# Patient Record
Sex: Male | Born: 1970 | Race: White | Hispanic: No | Marital: Married | Smoking: Never smoker
Health system: Southern US, Community
[De-identification: ages and names within clinical notes are randomized; demographics above are authoritative.]

## PROBLEM LIST (undated history)

## (undated) DIAGNOSIS — K449 Diaphragmatic hernia without obstruction or gangrene: Secondary | ICD-10-CM

## (undated) DIAGNOSIS — M542 Cervicalgia: Secondary | ICD-10-CM

## (undated) DIAGNOSIS — F419 Anxiety disorder, unspecified: Secondary | ICD-10-CM

## (undated) DIAGNOSIS — I10 Essential (primary) hypertension: Secondary | ICD-10-CM

## (undated) DIAGNOSIS — K209 Esophagitis, unspecified without bleeding: Secondary | ICD-10-CM

## (undated) DIAGNOSIS — M1990 Unspecified osteoarthritis, unspecified site: Secondary | ICD-10-CM

## (undated) DIAGNOSIS — R091 Pleurisy: Secondary | ICD-10-CM

## (undated) DIAGNOSIS — R0600 Dyspnea, unspecified: Secondary | ICD-10-CM

## (undated) DIAGNOSIS — E785 Hyperlipidemia, unspecified: Secondary | ICD-10-CM

## (undated) HISTORY — DX: Esophagitis, unspecified: K20.9

## (undated) HISTORY — DX: Unspecified osteoarthritis, unspecified site: M19.90

## (undated) HISTORY — PX: WISDOM TOOTH EXTRACTION: SHX21

## (undated) HISTORY — DX: Esophagitis, unspecified without bleeding: K20.90

## (undated) HISTORY — DX: Hyperlipidemia, unspecified: E78.5

## (undated) HISTORY — DX: Diaphragmatic hernia without obstruction or gangrene: K44.9

---

## 2001-05-24 ENCOUNTER — Emergency Department (HOSPITAL_COMMUNITY): Admission: EM | Admit: 2001-05-24 | Discharge: 2001-05-24 | Payer: Self-pay | Admitting: Emergency Medicine

## 2006-01-26 ENCOUNTER — Emergency Department (HOSPITAL_COMMUNITY): Admission: EM | Admit: 2006-01-26 | Discharge: 2006-01-26 | Payer: Self-pay

## 2009-05-31 ENCOUNTER — Observation Stay (HOSPITAL_COMMUNITY): Admission: EM | Admit: 2009-05-31 | Discharge: 2009-05-31 | Payer: Self-pay | Admitting: Emergency Medicine

## 2009-06-04 ENCOUNTER — Ambulatory Visit: Payer: Self-pay | Admitting: Family Medicine

## 2009-06-04 DIAGNOSIS — R079 Chest pain, unspecified: Secondary | ICD-10-CM

## 2009-06-04 DIAGNOSIS — I10 Essential (primary) hypertension: Secondary | ICD-10-CM | POA: Insufficient documentation

## 2009-06-11 ENCOUNTER — Telehealth (INDEPENDENT_AMBULATORY_CARE_PROVIDER_SITE_OTHER): Payer: Self-pay | Admitting: *Deleted

## 2009-06-12 ENCOUNTER — Encounter: Payer: Self-pay | Admitting: Cardiovascular Disease

## 2009-06-12 ENCOUNTER — Ambulatory Visit: Payer: Self-pay

## 2009-06-18 ENCOUNTER — Ambulatory Visit: Payer: Self-pay | Admitting: Family Medicine

## 2009-07-31 ENCOUNTER — Telehealth (INDEPENDENT_AMBULATORY_CARE_PROVIDER_SITE_OTHER): Payer: Self-pay | Admitting: *Deleted

## 2009-08-02 ENCOUNTER — Ambulatory Visit: Payer: Self-pay | Admitting: Family Medicine

## 2009-08-20 ENCOUNTER — Ambulatory Visit: Payer: Self-pay | Admitting: Family Medicine

## 2009-08-20 DIAGNOSIS — M545 Low back pain: Secondary | ICD-10-CM

## 2009-08-20 DIAGNOSIS — K219 Gastro-esophageal reflux disease without esophagitis: Secondary | ICD-10-CM | POA: Insufficient documentation

## 2009-11-20 ENCOUNTER — Ambulatory Visit: Payer: Self-pay | Admitting: Family Medicine

## 2009-12-10 ENCOUNTER — Encounter: Payer: Self-pay | Admitting: Family Medicine

## 2010-05-26 ENCOUNTER — Ambulatory Visit: Payer: Self-pay | Admitting: Family Medicine

## 2010-05-26 DIAGNOSIS — M940 Chondrocostal junction syndrome [Tietze]: Secondary | ICD-10-CM | POA: Insufficient documentation

## 2010-11-24 ENCOUNTER — Ambulatory Visit: Payer: Self-pay | Admitting: Family Medicine

## 2010-12-02 ENCOUNTER — Telehealth: Payer: Self-pay | Admitting: Family Medicine

## 2010-12-02 LAB — CONVERTED CEMR LAB
ALT: 25 units/L (ref 0–53)
AST: 15 units/L (ref 0–37)
Albumin: 4.1 g/dL (ref 3.5–5.2)
Alkaline Phosphatase: 61 units/L (ref 39–117)
BUN: 11 mg/dL (ref 6–23)
CO2: 29 meq/L (ref 19–32)
Chloride: 102 meq/L (ref 96–112)
Creatinine, Ser: 1 mg/dL (ref 0.4–1.5)
Eosinophils Relative: 3.7 % (ref 0.0–5.0)
HCT: 44.9 % (ref 39.0–52.0)
Hemoglobin: 15.5 g/dL (ref 13.0–17.0)
MCHC: 34.5 g/dL (ref 30.0–36.0)
MCV: 89.4 fL (ref 78.0–100.0)
Monocytes Absolute: 0.6 10*3/uL (ref 0.1–1.0)
Monocytes Relative: 7.6 % (ref 3.0–12.0)
Neutro Abs: 4.5 10*3/uL (ref 1.4–7.7)
Neutrophils Relative %: 60.5 % (ref 43.0–77.0)
Potassium: 4.6 meq/L (ref 3.5–5.1)
RBC: 5.02 M/uL (ref 4.22–5.81)
RDW: 13.9 % (ref 11.5–14.6)
TSH: 1.44 microintl units/mL (ref 0.35–5.50)
Total Bilirubin: 1.1 mg/dL (ref 0.3–1.2)

## 2011-01-08 NOTE — Medication Information (Signed)
Summary: Approval for Omeprazole/[employer]  Approval for Omeprazole/[employer]   Imported By: Maryln Gottron 12/16/2009 14:06:19  _____________________________________________________________________  External Attachment:    Type:   Image     Comment:   External Document

## 2011-01-08 NOTE — Progress Notes (Signed)
Summary: lab results  Phone Note Call from Patient Call back at Home Phone [phone]   Caller: Spouse Call For: Nelwyn Salisbury MD Summary of Call: Pt is calling for lab results.  Initial call taken by: Lynann Beaver CMA AAMA,  December 02, 2010 10:11 AM  Follow-up for Phone Call        see report Follow-up by: Nelwyn Salisbury MD,  December 02, 2010 12:53 PM     Appended Document: lab results spoke with pt - informed about labs - he also discussed c/o ??chest discomfort at night - ??GERD  not currently taking any PPI - and states aleve2 two times a day upseting stomach . Instructed to use prilosec OTC with evening meal , no eating 3-4 hrs before bedtime, no caffine in evening , ok to cut back on aleve to 1 two times a day - if no improvment in signs - to see .  Will inform Dr. Gracelyn Nurse

## 2011-01-08 NOTE — Assessment & Plan Note (Signed)
Summary: 6 MNTH ROV//SLM rsc bmp/njr   Vital Signs:  Patient profile:   40 year old male Height:      70.5 inches (179.07 cm) Weight:      209 pounds (95.00 kg) BMI:     29.67 BP sitting:   130 / 78  (left arm) Cuff size:   large  Vitals Entered By: Josph Macho RMA (May 26, 2010 1:11 PM) CC: 6 month follow up/ chest sore Xfew weeks/ pt states he is not taking the Flexeril and insurance won't pay for Omeprazole/ CF Is Patient Diabetic? No   History of Present Illness: Here to recheck HTN and also to ask about sharp left sided chest pains that came up about one week ago. These are not associated with exertion, and there is no nausea or SOB with them. He is tender when he pushes on his chest. His GERD has been fairly well controlled with diet.   Current Medications (verified): 1)  Metoprolol Succinate 50 Mg Xr24h-Tab (Metoprolol Succinate) .... Once Daily 2)  Omeprazole 40 Mg Cpdr (Omeprazole) .... Once Daily 3)  Flexeril 10 Mg Tabs (Cyclobenzaprine Hcl) .... Three Times A Day As Needed Spasm  Allergies (verified): No Known Drug Allergies  Past History:  Past Medical History: Reviewed history from 11/20/2009 and no changes required. Chickenpox Hypertension normal cardiac stress test 06-12-09 GERD  Review of Systems  The patient denies anorexia, fever, weight loss, weight gain, vision loss, decreased hearing, hoarseness, syncope, dyspnea on exertion, peripheral edema, prolonged cough, headaches, hemoptysis, abdominal pain, melena, hematochezia, severe indigestion/heartburn, hematuria, incontinence, genital sores, muscle weakness, suspicious skin lesions, transient blindness, difficulty walking, depression, unusual weight change, abnormal bleeding, enlarged lymph nodes, angioedema, breast masses, and testicular masses.    Physical Exam  General:  Well-developed,well-nourished,in no acute distress; alert,appropriate and cooperative throughout examination Neck:  No  deformities, masses, or tenderness noted. Chest Wall:  tender over the left sternal margin Lungs:  Normal respiratory effort, chest expands symmetrically. Lungs are clear to auscultation, no crackles or wheezes. Heart:  Normal rate and regular rhythm. S1 and S2 normal without gallop, murmur, click, rub or other extra sounds.   Impression & Recommendations:  Problem # 1:  COSTOCHONDRITIS (ICD-733.6)  Problem # 2:  GERD (ICD-530.81)  His updated medication list for this problem includes:    Omeprazole 40 Mg Cpdr (Omeprazole) ..... Once daily  Problem # 3:  HYPERTENSION (ICD-401.9)  His updated medication list for this problem includes:    Metoprolol Succinate 50 Mg Xr24h-tab (Metoprolol succinate) ..... Once daily  Complete Medication List: 1)  Metoprolol Succinate 50 Mg Xr24h-tab (Metoprolol succinate) .... Once daily 2)  Omeprazole 40 Mg Cpdr (Omeprazole) .... Once daily 3)  Flexeril 10 Mg Tabs (Cyclobenzaprine hcl) .... Three times a day as needed spasm  Patient Instructions: 1)  Reassured this was temporary. Use Motrin as needed . 2)  Please schedule a follow-up appointment as needed .

## 2011-01-08 NOTE — Assessment & Plan Note (Signed)
Summary: 6 month fup//ccm   Vital Signs:  Patient profile:   40 year old male Weight:      210 pounds O2 Sat:      98 % Temp:     98.7 degrees F Pulse rate:   65 / minute BP sitting:   140 / 90  (left arm)  Vitals Entered By: Pura Spice, RN (November 24, 2010 1:07 PM) CC: 6 mth f/u  bp  c/o chest pain comes and goes sharp pain  states pain dont last long pain in arm    History of Present Illness: Here to recheck HTN and also to discuss continued intermittent chest pains. he had a workup for these pains last year, and this included a normal stress test. he was diagnosed with costochondritis, and it improved for a long while. Now in the past week these sharp anterior chest pains have flared up again. No SOB. They are not associated with exertion.   Allergies: No Known Drug Allergies  Past History:  Past Medical History: Chickenpox Hypertension normal cardiac stress test 06-12-09 GERD costochondritis  Past Surgical History: Reviewed history from 06/04/2009 and no changes required. Denies surgical history  Review of Systems  The patient denies anorexia, fever, weight loss, weight gain, vision loss, decreased hearing, hoarseness, syncope, dyspnea on exertion, peripheral edema, prolonged cough, headaches, hemoptysis, abdominal pain, melena, hematochezia, severe indigestion/heartburn, hematuria, incontinence, genital sores, muscle weakness, suspicious skin lesions, transient blindness, difficulty walking, depression, unusual weight change, abnormal bleeding, enlarged lymph nodes, angioedema, breast masses, and testicular masses.    Physical Exam  General:  Well-developed,well-nourished,in no acute distress; alert,appropriate and cooperative throughout examination Neck:  No deformities, masses, or tenderness noted. Chest Wall:  tender in the left anterior chest  Lungs:  Normal respiratory effort, chest expands symmetrically. Lungs are clear to auscultation, no crackles or  wheezes. Heart:  Normal rate and regular rhythm. S1 and S2 normal without gallop, murmur, click, rub or other extra sounds.   Impression & Recommendations:  Problem # 1:  HYPERTENSION (ICD-401.9)  His updated medication list for this problem includes:    Metoprolol Succinate 50 Mg Xr24h-tab (Metoprolol succinate) ..... Once daily  Orders: UA Dipstick w/o Micro (automated)  (81003) Venipuncture (16109) TLB-BMP (Basic Metabolic Panel-BMET) (80048-METABOL) TLB-CBC Platelet - w/Differential (85025-CBCD) TLB-Hepatic/Liver Function Pnl (80076-HEPATIC) TLB-TSH (Thyroid Stimulating Hormone) (84443-TSH)  Problem # 2:  COSTOCHONDRITIS (ICD-733.6)  Complete Medication List: 1)  Metoprolol Succinate 50 Mg Xr24h-tab (Metoprolol succinate) .... Once daily 2)  Omeprazole 40 Mg Cpdr (Omeprazole) .... Once daily 3)  Flexeril 10 Mg Tabs (Cyclobenzaprine hcl) .... Three times a day as needed spasm 4)  Aleve 220 Mg Tabs (Naproxen sodium) .... 2 tabs two times a day as needed  Patient Instructions: 1)  get labs. Start Aleeve two times a day for a few weeks.    Orders Added: 1)  UA Dipstick w/o Micro (automated)  [81003] 2)  Venipuncture [36415] 3)  TLB-BMP (Basic Metabolic Panel-BMET) [80048-METABOL] 4)  TLB-CBC Platelet - w/Differential [85025-CBCD] 5)  TLB-Hepatic/Liver Function Pnl [80076-HEPATIC] 6)  TLB-TSH (Thyroid Stimulating Hormone) [84443-TSH] 7)  Est. Patient Level IV [60454]  Appended Document: Orders Update    Clinical Lists Changes  Orders: Added new Service order of Specimen Handling (09811) - Signed      Appended Document: 6 month fup//ccm  Laboratory Results   Urine Tests    Routine Urinalysis   Color: yellow Appearance: Clear Glucose: negative   (Normal Range: Negative) Bilirubin: negative   (  Normal Range: Negative) Ketone: negative   (Normal Range: Negative) Spec. Gravity: 1.025   (Normal Range: 1.003-1.035) Blood: negative   (Normal Range:  Negative) pH: 5.0   (Normal Range: 5.0-8.0) Protein: negative   (Normal Range: Negative) Urobilinogen: 0.2   (Normal Range: 0-1) Nitrite: negative   (Normal Range: Negative) Leukocyte Esterace: negative   (Normal Range: Negative)    Comments: Rita Ohara  November 24, 2010 4:42 PM

## 2011-02-10 ENCOUNTER — Ambulatory Visit (INDEPENDENT_AMBULATORY_CARE_PROVIDER_SITE_OTHER): Payer: BC Managed Care – PPO | Admitting: Family Medicine

## 2011-02-10 ENCOUNTER — Encounter: Payer: Self-pay | Admitting: Family Medicine

## 2011-02-10 VITALS — BP 120/88 | Temp 98.8°F | Ht 71.0 in | Wt 212.0 lb

## 2011-02-10 DIAGNOSIS — M542 Cervicalgia: Secondary | ICD-10-CM

## 2011-02-10 DIAGNOSIS — I10 Essential (primary) hypertension: Secondary | ICD-10-CM

## 2011-02-10 DIAGNOSIS — R51 Headache: Secondary | ICD-10-CM

## 2011-02-10 DIAGNOSIS — F411 Generalized anxiety disorder: Secondary | ICD-10-CM

## 2011-02-10 DIAGNOSIS — F419 Anxiety disorder, unspecified: Secondary | ICD-10-CM

## 2011-02-10 MED ORDER — CYCLOBENZAPRINE HCL 10 MG PO TABS: 10.0000 mg | ORAL_TABLET | Freq: Three times a day (TID) | ORAL | Status: AC | PRN

## 2011-02-10 MED ORDER — MELOXICAM 15 MG PO TABS: 15.0000 mg | ORAL_TABLET | Freq: Every day | ORAL | Status: AC

## 2011-02-10 NOTE — Progress Notes (Signed)
  Subjective:    Patient ID: Harold Robinson, male    DOB: July 29, 1971, 40 y.o.   MRN: [id number]  HPI Here to discuss several weeks of increased stiffness and pain in the posterior neck, across the shoulders, and up to the back of the head. These are worst in the mornings, then they ease up when he gets looser during the day. Hot showers help, Aleeve does not. He says he is under tremendous stress at his job. It is difficult work and they force him to work 60-70 hours a week. He can't relax and can't sleep. His BP is stable though   Review of Systems  Constitutional: Negative.   Respiratory: Negative.   Cardiovascular: Negative.   Musculoskeletal: Positive for back pain.  Neurological: Positive for headaches.  Psychiatric/Behavioral: Positive for sleep disturbance, decreased concentration and agitation. The patient is nervous/anxious.        Objective:   Physical Exam        Assessment & Plan:  He has arthritis up and down the spine, and this is causing tension HAs. He has a lot of anxiety as well. We will write him a note for work to limit him to working 40 hours every week. Start on Meloxicam and Flexeril for the neck problems.

## 2011-02-11 ENCOUNTER — Other Ambulatory Visit: Payer: Self-pay | Admitting: Family Medicine

## 2011-02-11 NOTE — Telephone Encounter (Signed)
In that case he should go to the ER. May need IV fluids, a scan of his head, etc.

## 2011-02-11 NOTE — Telephone Encounter (Signed)
Triage vm------patient saw Dr Clent Ridges yesterday for head and neck pain with nausea. Today, he is still in a lot of pain and is vomiting. Please advise, asap.

## 2011-02-11 NOTE — Telephone Encounter (Signed)
Wife is calling back, headache is worse, pt is vomiting. No fever, no relief for pain and needs stronger pain meds. Appt scheduled tomorrow.

## 2011-02-11 NOTE — Telephone Encounter (Signed)
Spoke with wife

## 2011-02-12 ENCOUNTER — Emergency Department (HOSPITAL_COMMUNITY)
Admission: EM | Admit: 2011-02-12 | Discharge: 2011-02-12 | Disposition: A | Discharge status: Home / Self Care | Payer: BC Managed Care – PPO | Attending: Emergency Medicine | Admitting: Emergency Medicine

## 2011-02-12 ENCOUNTER — Emergency Department (HOSPITAL_COMMUNITY): Payer: BC Managed Care – PPO

## 2011-02-12 DIAGNOSIS — Y990 Civilian activity done for income or pay: Secondary | ICD-10-CM | POA: Insufficient documentation

## 2011-02-12 DIAGNOSIS — I10 Essential (primary) hypertension: Secondary | ICD-10-CM | POA: Insufficient documentation

## 2011-02-12 DIAGNOSIS — Y929 Unspecified place or not applicable: Secondary | ICD-10-CM | POA: Insufficient documentation

## 2011-02-12 DIAGNOSIS — S139XXA Sprain of joints and ligaments of unspecified parts of neck, initial encounter: Secondary | ICD-10-CM | POA: Insufficient documentation

## 2011-02-12 DIAGNOSIS — R209 Unspecified disturbances of skin sensation: Secondary | ICD-10-CM | POA: Insufficient documentation

## 2011-02-12 DIAGNOSIS — Z79899 Other long term (current) drug therapy: Secondary | ICD-10-CM | POA: Insufficient documentation

## 2011-02-12 DIAGNOSIS — M542 Cervicalgia: Secondary | ICD-10-CM | POA: Insufficient documentation

## 2011-02-12 DIAGNOSIS — R112 Nausea with vomiting, unspecified: Secondary | ICD-10-CM | POA: Insufficient documentation

## 2011-02-16 ENCOUNTER — Telehealth: Payer: Self-pay | Admitting: Family Medicine

## 2011-02-16 NOTE — Telephone Encounter (Signed)
Have him see me OV tomorrow so we can decide what kind of testing is best

## 2011-02-16 NOTE — Telephone Encounter (Signed)
Pt was advised to go to the ER last week due to a head injury - pt is still having headaches and motion sickness -  Er suggested pt should have a MRI - patients wife request that Dr Clent Ridges follow up with the ER and call her for further instructions.

## 2011-02-17 ENCOUNTER — Encounter: Payer: Self-pay | Admitting: Family Medicine

## 2011-02-17 ENCOUNTER — Ambulatory Visit (INDEPENDENT_AMBULATORY_CARE_PROVIDER_SITE_OTHER): Payer: BC Managed Care – PPO | Admitting: Family Medicine

## 2011-02-17 VITALS — BP 140/90 | HR 74 | Temp 98.3°F | Wt 214.0 lb

## 2011-02-17 DIAGNOSIS — F411 Generalized anxiety disorder: Secondary | ICD-10-CM

## 2011-02-17 DIAGNOSIS — R51 Headache: Secondary | ICD-10-CM

## 2011-02-17 DIAGNOSIS — M542 Cervicalgia: Secondary | ICD-10-CM

## 2011-02-17 DIAGNOSIS — F419 Anxiety disorder, unspecified: Secondary | ICD-10-CM

## 2011-02-17 MED ORDER — LORAZEPAM 0.5 MG PO TABS: 0.5000 mg | ORAL_TABLET | Freq: Four times a day (QID) | ORAL | Status: AC | PRN
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
## 2011-02-17 NOTE — Telephone Encounter (Signed)
Pt already sch to come in on 02/17/11 at 3:45pm.

## 2011-03-12 ENCOUNTER — Ambulatory Visit
Admission: RE | Admit: 2011-03-12 | Discharge: 2011-03-12 | Disposition: A | Discharge status: Home / Self Care | Payer: BC Managed Care – PPO | Source: Ambulatory Visit | Attending: Family Medicine | Admitting: Family Medicine

## 2011-03-12 DIAGNOSIS — M542 Cervicalgia: Secondary | ICD-10-CM

## 2011-03-16 ENCOUNTER — Telehealth: Payer: Self-pay | Admitting: *Deleted

## 2011-03-16 ENCOUNTER — Telehealth: Payer: Self-pay

## 2011-03-16 DIAGNOSIS — M542 Cervicalgia: Secondary | ICD-10-CM

## 2011-03-16 LAB — CBC
Platelets: 264 10*3/uL (ref 150–400)
RDW: 13.6 % (ref 11.5–15.5)
WBC: 7.4 10*3/uL (ref 4.0–10.5)

## 2011-03-16 LAB — POCT CARDIAC MARKERS
CKMB, poc: <1 ng/mL — ABNORMAL LOW (ref 1.0–8.0)
CKMB, poc: <1 ng/mL — ABNORMAL LOW (ref 1.0–8.0)
CKMB, poc: <1 ng/mL — ABNORMAL LOW (ref 1.0–8.0)
Myoglobin, poc: 32 ng/mL (ref 12–200)
Myoglobin, poc: 57.2 ng/mL (ref 12–200)
Troponin i, poc: <0.05 ng/mL (ref 0.00–0.09)
Troponin i, poc: <0.05 ng/mL (ref 0.00–0.09)

## 2011-03-16 LAB — BASIC METABOLIC PANEL
BUN: 8 mg/dL (ref 6–23)
GFR calc Af Amer: >60 mL/min (ref >60)
GFR calc non Af Amer: >60 mL/min (ref >60)
Potassium: 3.3 mEq/L — ABNORMAL LOW (ref 3.5–5.1)

## 2011-03-16 LAB — DIFFERENTIAL
Basophils Relative: 1 % (ref 0–1)
Eosinophils Relative: 4 % (ref 0–5)
Lymphocytes Relative: 25 % (ref 12–46)
Lymphs Abs: 1.8 10*3/uL (ref 0.7–4.0)
Monocytes Absolute: 0.6 10*3/uL (ref 0.1–1.0)
Monocytes Relative: 8 % (ref 3–12)
Neutro Abs: 4.7 10*3/uL (ref 1.7–7.7)
Neutrophils Relative %: 63 % (ref 43–77)

## 2011-03-16 NOTE — Telephone Encounter (Signed)
Message copied by Madison Hickman on Mon Mar 16, 2011  4:37 PM ------      Message from: Dwaine Deter      Created: Mon Mar 16, 2011 12:44 PM       Normal except mild degenerative changes

## 2011-03-16 NOTE — Telephone Encounter (Signed)
Wife aware of MRI results and referral . Informed pt if not heard back from Terri in 3 days return call.

## 2011-03-16 NOTE — Telephone Encounter (Signed)
Pts wife called and said that pt is still in a lot of pain. Pt is req to get MRI results asap today. Need to know what to do. Pls call asap today.

## 2011-03-16 NOTE — Telephone Encounter (Signed)
I referred him to Dr. Ethelene Hal for this

## 2011-03-16 NOTE — Telephone Encounter (Signed)
Pt would like to discuss MRI results and ask Dr. Clent Ridges if he needs to come back in as he is having continued pain in neck and back.

## 2011-03-16 NOTE — Telephone Encounter (Signed)
Spouse aware.

## 2011-03-19 ENCOUNTER — Telehealth: Payer: Self-pay | Admitting: Family Medicine

## 2011-03-19 DIAGNOSIS — M542 Cervicalgia: Secondary | ICD-10-CM

## 2011-03-19 NOTE — Telephone Encounter (Signed)
Pts spouse called and said that pt has found a Brain and Sp[facility] Specialist that he would like to be referred to. Pt is req referral from Dr Clent Ridges. Pt also needs to get the recent mri results, recent xray and xray from approx 4 yrs ago, forward to Dr Stefani Dama [facility]. Their phone # is [phone] and Fax # [phone]. Pt has lft vm at Dr Tonia Brooms office and have not gotten a response. Pt is filling in a medical release to get the req info sent over to Dr Danielle Dess.

## 2011-03-19 NOTE — Telephone Encounter (Signed)
Spoke with pt  appt with Dr Ethelene Hal May 2 but preferring Dr Danielle Dess and an appt has been sch tentatively for April 24,2012 and pt informed that he is in network with this group.  Pt requesting "release of info" mailed to home address .   Pt would llike call back on Monday if he can have referral.  Release form mailed to pt.

## 2011-03-23 NOTE — Telephone Encounter (Signed)
Referral was done  

## 2011-03-23 NOTE — Telephone Encounter (Signed)
Spouse aware.

## 2011-05-12 ENCOUNTER — Telehealth: Payer: Self-pay | Admitting: Family Medicine

## 2011-05-12 NOTE — Telephone Encounter (Signed)
Pts spouse called and said that pt is having anger outburst due to neck injury. Anger is becoming a lot worse in the last yr or two. Pt is req to come in for ov on 05/22/11 to see Dr Clent Ridges re: getting on a med to help calm pt down. If appt is not avail, pt req that Dr Clent Ridges call in a med to [facility].

## 2011-05-13 NOTE — Telephone Encounter (Signed)
Schedule appt for pt please

## 2011-05-13 NOTE — Telephone Encounter (Signed)
Pts spouse called back again to check on status of getting a work in ov with Dr Clent Ridges on 6/15, or getting appt with another dr re: getting a med to help calm pt down. Pls call asap.

## 2011-05-14 NOTE — Telephone Encounter (Signed)
Called pts spouse and sch pt for 05/22/11 with Dr Caryl Never, since Dr Clent Ridges out of office that day, as noted.

## 2011-05-22 ENCOUNTER — Telehealth: Payer: Self-pay | Admitting: Family Medicine

## 2011-05-22 ENCOUNTER — Encounter: Payer: Self-pay | Admitting: Family Medicine

## 2011-05-22 ENCOUNTER — Ambulatory Visit (INDEPENDENT_AMBULATORY_CARE_PROVIDER_SITE_OTHER): Payer: BC Managed Care – PPO | Admitting: Family Medicine

## 2011-05-22 VITALS — BP 120/80 | Temp 98.3°F | Wt 213.0 lb

## 2011-05-22 DIAGNOSIS — G8929 Other chronic pain: Secondary | ICD-10-CM

## 2011-05-22 DIAGNOSIS — F411 Generalized anxiety disorder: Secondary | ICD-10-CM

## 2011-05-22 DIAGNOSIS — M542 Cervicalgia: Secondary | ICD-10-CM

## 2011-05-22 DIAGNOSIS — F419 Anxiety disorder, unspecified: Secondary | ICD-10-CM

## 2011-05-22 DIAGNOSIS — F329 Major depressive disorder, single episode, unspecified: Secondary | ICD-10-CM

## 2011-05-22 MED ORDER — SERTRALINE HCL 50 MG PO TABS: 50.0000 mg | ORAL_TABLET | Freq: Every day | ORAL | Status: DC

## 2011-05-22 NOTE — Telephone Encounter (Signed)
I was happy to see him but we really need to encourage him to follow up with Dr Clent Ridges

## 2011-05-22 NOTE — Telephone Encounter (Signed)
Patient would like to switch from Dr Clent Ridges to Dr Caryl Never. Is this ok?

## 2011-05-22 NOTE — Progress Notes (Signed)
  Subjective:    Patient ID: Harold Robinson, male    DOB: May 07, 1971, 40 y.o.   MRN: [id number]  HPI Patient here accompanied by wife to discuss some anger issues. Patient reportedly has low frustration tolerance and easily agitated. Neck injury around 2007 and she thinks more irritability since then. Poor sleep quality off and on. Frequently has depressed mood. No suicidal thoughts. Some decreased interest in activities and hobbies. Does not go periods without sleep or any history of impulsive type behaviors.  No ETOH or illicit drug use.  He has taken lorazepam in the past with minimal improvement. He feels he is having equal components of anxiety and depression. In general has some stress with work but no new stressors. Marriage is going well.    Review of Systems  Respiratory: Negative for shortness of breath and wheezing.   Cardiovascular: Negative for chest pain, palpitations and leg swelling.  Neurological: Negative for headaches.  Psychiatric/Behavioral: Positive for dysphoric mood. Negative for suicidal ideas, confusion and self-injury. The patient is nervous/anxious.        Objective:   Physical Exam  Constitutional: He is oriented to person, place, and time. He appears well-developed and well-nourished.  HENT:  Head: Normocephalic and atraumatic.  Mouth/Throat: Oropharynx is clear and moist.  Neck: Neck supple. No thyromegaly present.  Cardiovascular: Normal rate, regular rhythm and normal heart sounds.   Pulmonary/Chest: Effort normal and breath sounds normal. He has no wheezes.  Musculoskeletal: He exhibits no edema.  Lymphadenopathy:    He has no cervical adenopathy.  Neurological: He is alert and oriented to person, place, and time.          Assessment & Plan:  #1 depression and anxiety. Patient describes some progressive depressive symptoms and chronic anxiety. Discussed possible counseling. Sertraline 50 mg daily and followup with primary physician in 4 weeks to  reassess.  Reviewed possible side effects. #2 chronic neck pain. Encouraged followup with neurosurgeon whom he has seen multiple times in the past

## 2011-05-25 NOTE — Telephone Encounter (Signed)
Noted. Please have him set up a follow up OV with me in a few weeks

## 2011-06-17 ENCOUNTER — Encounter: Payer: Self-pay | Admitting: Family Medicine

## 2011-06-17 ENCOUNTER — Telehealth: Payer: Self-pay | Admitting: *Deleted

## 2011-06-17 ENCOUNTER — Ambulatory Visit (INDEPENDENT_AMBULATORY_CARE_PROVIDER_SITE_OTHER): Payer: BC Managed Care – PPO | Admitting: Family Medicine

## 2011-06-17 VITALS — BP 130/84 | Temp 98.2°F | Wt 212.0 lb

## 2011-06-17 DIAGNOSIS — F329 Major depressive disorder, single episode, unspecified: Secondary | ICD-10-CM

## 2011-06-17 MED ORDER — SERTRALINE HCL 50 MG PO TABS: 50.0000 mg | ORAL_TABLET | Freq: Every day | ORAL | Status: DC

## 2011-06-17 NOTE — Progress Notes (Signed)
  Subjective:    Patient ID: Harold Robinson, male    DOB: 1971-08-24, 40 y.o.   MRN: [id number]  HPI Follow up recent depression. Recent anxiety and depression issues. Started sertraline 50 mg daily.  Recommend followup with his primary physician and he somehow was scheduled today. Patient is having chronic neck pain and has scheduled followup with neurosurgeon for epidural injections Monday. He has some anxiety regarding that.  Overall his depression and anxiety symptoms are greatly improved on sertraline 50 mg daily. No side effects from medication. Sleep overall improved. Improved motivation.  No suicidal thoughts.   Review of Systems  Constitutional: Negative for fever, chills and appetite change.  Cardiovascular: Negative for chest pain and palpitations.  Neurological: Negative for dizziness, weakness and numbness.       Objective:   Physical Exam  Constitutional: He is oriented to person, place, and time. He appears well-developed and well-nourished.  Cardiovascular: Normal rate, regular rhythm and normal heart sounds.   Pulmonary/Chest: Effort normal and breath sounds normal. No respiratory distress. He has no wheezes. He has no rales.  Musculoskeletal: He exhibits no edema.  Neurological: He is alert and oriented to person, place, and time.  Psychiatric: He has a normal mood and affect. His behavior is normal. Judgment and thought content normal.          Assessment & Plan:  Depression clinically improved. Continue Zoloft 50 mg daily. Recommend followup with primary in 2-3 months

## 2011-06-17 NOTE — Telephone Encounter (Signed)
Pt requesting transfer of care from Dr Clent Ridges to Dr Caryl Never

## 2011-06-23 NOTE — Telephone Encounter (Signed)
Okay with me 

## 2011-06-23 NOTE — Telephone Encounter (Signed)
Ok with me 

## 2011-06-25 NOTE — Telephone Encounter (Signed)
Transfer of care done

## 2011-07-25 ENCOUNTER — Other Ambulatory Visit: Payer: Self-pay | Admitting: Family Medicine

## 2011-07-28 ENCOUNTER — Telehealth: Payer: Self-pay | Admitting: Family Medicine

## 2011-07-28 MED ORDER — METOPROLOL SUCCINATE ER 50 MG PO TB24: 50.0000 mg | ORAL_TABLET | Freq: Every day | ORAL | Status: DC

## 2011-07-28 NOTE — Telephone Encounter (Signed)
Script for Metoprolol sent e-scribe.

## 2011-07-29 NOTE — Telephone Encounter (Signed)
Opened in error

## 2011-08-20 ENCOUNTER — Other Ambulatory Visit: Payer: Self-pay | Admitting: Family Medicine

## 2011-08-21 ENCOUNTER — Telehealth: Payer: Self-pay | Admitting: Family Medicine

## 2011-08-21 MED ORDER — METOPROLOL SUCCINATE ER 50 MG PO TB24: 50.0000 mg | ORAL_TABLET | Freq: Every day | ORAL | Status: DC

## 2011-08-21 NOTE — Telephone Encounter (Signed)
Script sent e-scribe 

## 2011-12-18 ENCOUNTER — Other Ambulatory Visit: Payer: Self-pay | Admitting: Family Medicine

## 2012-06-13 ENCOUNTER — Encounter: Payer: Self-pay | Admitting: Family

## 2012-06-13 ENCOUNTER — Ambulatory Visit (INDEPENDENT_AMBULATORY_CARE_PROVIDER_SITE_OTHER): Payer: BC Managed Care – PPO | Admitting: Family

## 2012-06-13 VITALS — HR 68 | Temp 98.8°F | Wt 219.0 lb

## 2012-06-13 DIAGNOSIS — M79609 Pain in unspecified limb: Secondary | ICD-10-CM

## 2012-06-13 DIAGNOSIS — Z23 Encounter for immunization: Secondary | ICD-10-CM

## 2012-06-13 DIAGNOSIS — IMO0002 Reserved for concepts with insufficient information to code with codable children: Secondary | ICD-10-CM

## 2012-06-13 DIAGNOSIS — T24019A Burn of unspecified degree of unspecified thigh, initial encounter: Secondary | ICD-10-CM

## 2012-06-13 DIAGNOSIS — M79652 Pain in left thigh: Secondary | ICD-10-CM

## 2012-06-13 DIAGNOSIS — T24419A Corrosion of unspecified degree of unspecified thigh, initial encounter: Secondary | ICD-10-CM

## 2012-06-13 MED ORDER — SILVER SULFADIAZINE 1 % EX CREA: TOPICAL_CREAM | Freq: Every day | CUTANEOUS | Status: DC

## 2012-06-13 MED ORDER — PREDNISONE 20 MG PO TABS: ORAL_TABLET | ORAL | Status: AC

## 2012-06-13 MED ORDER — CEPHALEXIN 500 MG PO TABS: 500.0000 mg | ORAL_TABLET | Freq: Three times a day (TID) | ORAL | Status: AC

## 2012-06-13 NOTE — Patient Instructions (Signed)
Second-Degree Burn  A second-degree burn affects the 2 outer layers of skin. The outer layer (epidermis) and the layer underneath it (dermis) are both burned. Another name for this type of burn is a partial thickness burn. A second-degree burn may be called minor or major. This depends on the size of the burn. It also depends on what parts of the skin are burned. Minor burns may be treated with first aid. Major burns are a medical emergency.  A second-degree burn is worse than a first-degree burn, but not as bad as a third-degree burn. A first-degree burn affects only the epidermis. A third-degree burn goes through all the layers of skin. A second-degree burn usually heals in 3 to 4 weeks. A minor second-degree burn usually does not leave a scar.Deeper second-degree burns may lead to scarring of the skin or contractures over joints.Contractures are scars that form over joints and may lead to reduced mobility at those joints.  CAUSES   Heat (thermal) injury. This happens when skin comes in contact with something very hot. It could be a flame, a hot object, hot liquid, or steam. Most second-degree burns are thermal injuries.   Radiation. Sunlight is one type of radiation that can burn the skin. Another type of radiation is used to heat food. Radiation is also used to treat some diseases, such as cancer. All types of radiation can burn the skin. Sunlight usually causes a first-degree burn. Radiation used for heating food or treating a disease can cause a second-degree burn.   Electricity. Electrical burns can cause more damage under the skin than on the surface. They should always be treated as major burns.   Chemicals. Many chemicals can burn the skin. The burn should be flushed with cool water and checked by an emergency caregiver.  SYMPTOMS  Symptoms of second-degree burns include:   Severe pain.   Extreme tenderness.   Deep redness.   Blistered skin.   Skin that has changed color.It might look blotchy,  wet, or shiny.   Swelling.  TREATMENT  Some second-degree burns may need to be treated in a hospital. These include major burns, electrical burns, and chemical burns. Many other second-degree burns can be treated with regular first aid, such as:   Cooling the burn. Use cool, germ-free (sterile) salt water. Place the burned area of skin into a tub of water, or cover the burned area with clean, wet towels.   Taking pain medicine.   Removing the dead skin from broken blisters. A trained caregiver may do this. Do not pop blisters.   Gently washing your skin with mild soap.   Covering the burned area with a cream.Silver sulfadiazine is a cream for burns. An antibiotic cream, such as bacitracin, may also be used to fight infection. Do not use other ointments or creams unless your caregiver says it is okay.   Protecting the burn with a sterile, non-sticky bandage.   Bandaging fingers and toes separately. This keeps them from sticking together.   Taking an antibiotic. This can help prevent infection.   Getting a tetanus shot.  HOME CARE INSTRUCTIONS  Medication   Take any medicine prescribed by your caregiver. Follow the directions carefully.   Ask your caregiver if you can take over-the-counter medicine to relieve pain and swelling. Do not give aspirin to children.   Make sure your caregiver knows about all other medicines you take.This includes over-the-counter medicines.  Burn care   You will need to change the bandage on   your burn. You may need to do this 2 or 3 times each day.   Gently clean the burned area.   Put ointment on it.   Cover the burn with a sterile bandage.   For some deeper burns or burns that cover a large area, compression garments may be prescribed. These garments can help minimize scarring and protect your mobility.   Do not put butter or oil on your skin. Use only the cream prescribed by your caregiver.   Do not put ice on your burn.   Do not break blisters on your  skin.   Keep the bandaged area dry. You might need to take a sponge bath for awhile.Ask your caregiver when you can take a shower or a tub bath again.   Do not scratch an itchy burn. Your caregiver may give you medicine to relieve very bad itching.   Infection is a big danger after a second-degree burn. Tell your caregiver right away if you have signs of infection, such as:   Redness or changing color in the burned area.   Fluid leaking from the burn.   Swelling in the burn area.   A bad smell coming from the wound.  Follow-up   Keep all follow-up appointments.This is important. This is how your caregiver can tell if your treatment is working.   Protect your burn from sunlight.Use sunscreen whenever you go outside.Burned areas may be sensitive to the sun for up to 1 year. Exposure to the sun may also cause permanent darkening of scars.  SEEK MEDICAL CARE IF:   You have any questions about medicines.   You have any questions about your treatment.   You wonder if it is okay to do a particular activity.   You develop a fever of more than 100.5 F (38.1 C).  SEEK IMMEDIATE MEDICAL CARE IF:   You think your burn might be infected. It may change color, become red, leak fluid, swell, or smell bad.   You develop a fever of more than 102 F (38.9 C).  Document Released: 04/27/2011 Document Revised: 11/12/2011 Document Reviewed: 04/27/2011  [employer] Patient Information 2012 [employer]
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
## 2012-06-17 ENCOUNTER — Ambulatory Visit (INDEPENDENT_AMBULATORY_CARE_PROVIDER_SITE_OTHER): Payer: BC Managed Care – PPO | Admitting: Family

## 2012-06-17 ENCOUNTER — Encounter: Payer: Self-pay | Admitting: Family

## 2012-06-17 VITALS — BP 160/90 | HR 70 | Wt 219.0 lb

## 2012-06-17 DIAGNOSIS — T300 Burn of unspecified body region, unspecified degree: Secondary | ICD-10-CM

## 2012-06-17 DIAGNOSIS — T304 Corrosion of unspecified body region, unspecified degree: Secondary | ICD-10-CM

## 2012-06-17 DIAGNOSIS — IMO0002 Reserved for concepts with insufficient information to code with codable children: Secondary | ICD-10-CM
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
## 2012-06-27 ENCOUNTER — Other Ambulatory Visit: Payer: Self-pay | Admitting: Family Medicine

## 2012-07-09 ENCOUNTER — Other Ambulatory Visit: Payer: Self-pay | Admitting: Family Medicine

## 2013-01-10 ENCOUNTER — Other Ambulatory Visit: Payer: Self-pay | Admitting: Family Medicine

## 2013-01-15 ENCOUNTER — Other Ambulatory Visit: Payer: Self-pay | Admitting: Family Medicine

## 2013-03-16 ENCOUNTER — Other Ambulatory Visit: Payer: Self-pay | Admitting: Family Medicine

## 2013-05-11 ENCOUNTER — Other Ambulatory Visit: Payer: Self-pay | Admitting: Family Medicine

## 2013-05-24 ENCOUNTER — Ambulatory Visit: Payer: BC Managed Care – PPO | Admitting: Family Medicine

## 2013-05-26 ENCOUNTER — Ambulatory Visit (INDEPENDENT_AMBULATORY_CARE_PROVIDER_SITE_OTHER): Payer: BC Managed Care – PPO | Admitting: Family Medicine

## 2013-05-26 ENCOUNTER — Encounter: Payer: Self-pay | Admitting: Family Medicine

## 2013-05-26 VITALS — BP 140/80 | Temp 98.5°F | Wt 225.0 lb

## 2013-05-26 DIAGNOSIS — I10 Essential (primary) hypertension: Secondary | ICD-10-CM

## 2013-05-26 DIAGNOSIS — Z8659 Personal history of other mental and behavioral disorders: Secondary | ICD-10-CM

## 2013-05-26 DIAGNOSIS — R05 Cough: Secondary | ICD-10-CM

## 2013-05-26 MED ORDER — SERTRALINE HCL 50 MG PO TABS: ORAL_TABLET | ORAL | Status: DC

## 2013-05-26 NOTE — Progress Notes (Signed)
  Subjective:    Patient ID: Harold Robinson, male    DOB: [birth year], 42 y.o.   MRN: [id number]  HPI Patient seen for the following issues  Hypertension treated with metoprolol. Compliant with therapy. Blood pressures been stable. No headaches or dizziness. No recent chest pains.  History of depression and anxiety stable on sertraline. Still feels anxious at times. He lost his closest friend several months ago and it is still having difficulty adapting. Questions possible titration of dosage-of Zoloft. No suicidal ideation  Dry cough for the past few weeks. Nonsmoker. No postnasal drip. No active GERD symptoms. No wheezing. No hemoptysis. Symptoms are relatively mild  Ongoing neck pains. Has seen neurosurgeon. Known osteoarthritis. Takes anti-inflammatory medications as needed. No recent upper extremity weakness or any progressive pains  No past medical history on file. No past surgical history on file.  reports that he has never smoked. He has never used smokeless tobacco. He reports that he does not drink alcohol or use illicit drugs. family history is not on file. No Known Allergies    Review of Systems  Constitutional: Negative for chills, fatigue and unexpected weight change.  Eyes: Negative for visual disturbance.  Respiratory: Negative for cough, chest tightness and shortness of breath.   Cardiovascular: Negative for chest pain, palpitations and leg swelling.  Endocrine: Negative for polydipsia and polyuria.  Neurological: Negative for dizziness, syncope, weakness, light-headedness and headaches.       Objective:   Physical Exam  Constitutional: He is oriented to person, place, and time. He appears well-developed and well-nourished.  Neck: Neck supple. No thyromegaly present.  Cardiovascular: Normal rate and regular rhythm.   Pulmonary/Chest: Effort normal and breath sounds normal. No respiratory distress. He has no wheezes. He has no rales.  Musculoskeletal: He exhibits  no edema.  Neurological: He is alert and oriented to person, place, and time.          Assessment & Plan:  #1 hypertension. Stable. Continue metoprolol #2 history of depression/anxiety. Titrate sertraline 75 mg once daily. Touch base in one month with no additional improvements #3 dry cough. Nonfocal exam. Consider over-the-counter Prilosec. Touch base if cough not improving in the next couple of weeks

## 2013-07-28 ENCOUNTER — Other Ambulatory Visit: Payer: Self-pay | Admitting: Family Medicine

## 2013-08-06 ENCOUNTER — Emergency Department (HOSPITAL_COMMUNITY): Payer: BC Managed Care – PPO

## 2013-08-06 ENCOUNTER — Encounter (HOSPITAL_COMMUNITY): Payer: Self-pay | Admitting: *Deleted

## 2013-08-06 ENCOUNTER — Emergency Department (HOSPITAL_COMMUNITY)
Admission: EM | Admit: 2013-08-06 | Discharge: 2013-08-07 | Disposition: A | Discharge status: Home / Self Care | Payer: BC Managed Care – PPO | Attending: Emergency Medicine | Admitting: Emergency Medicine

## 2013-08-06 DIAGNOSIS — Z79899 Other long term (current) drug therapy: Secondary | ICD-10-CM | POA: Insufficient documentation

## 2013-08-06 DIAGNOSIS — R05 Cough: Secondary | ICD-10-CM | POA: Insufficient documentation

## 2013-08-06 DIAGNOSIS — R000 Tachycardia, unspecified: Secondary | ICD-10-CM | POA: Insufficient documentation

## 2013-08-06 DIAGNOSIS — R197 Diarrhea, unspecified: Secondary | ICD-10-CM | POA: Insufficient documentation

## 2013-08-06 DIAGNOSIS — R109 Unspecified abdominal pain: Secondary | ICD-10-CM | POA: Insufficient documentation

## 2013-08-06 DIAGNOSIS — Z8709 Personal history of other diseases of the respiratory system: Secondary | ICD-10-CM | POA: Insufficient documentation

## 2013-08-06 DIAGNOSIS — R509 Fever, unspecified: Secondary | ICD-10-CM | POA: Insufficient documentation

## 2013-08-06 DIAGNOSIS — R059 Cough, unspecified: Secondary | ICD-10-CM | POA: Insufficient documentation

## 2013-08-06 DIAGNOSIS — R112 Nausea with vomiting, unspecified: Secondary | ICD-10-CM

## 2013-08-06 DIAGNOSIS — R0602 Shortness of breath: Secondary | ICD-10-CM | POA: Insufficient documentation

## 2013-08-06 HISTORY — DX: Pleurisy: R09.1

## 2013-08-06 HISTORY — DX: Cervicalgia: M54.2

## 2013-08-06 LAB — POCT I-STAT, CHEM 8
BUN: 16 mg/dL (ref 6–23)
Calcium, Ion: 1.16 mmol/L (ref 1.12–1.23)
Chloride: 103 mEq/L (ref 96–112)
Creatinine, Ser: 1.5 mg/dL — ABNORMAL HIGH (ref 0.50–1.35)
Glucose, Bld: 139 mg/dL — ABNORMAL HIGH (ref 70–99)

## 2013-08-06 LAB — CBC WITH DIFFERENTIAL/PLATELET
Eosinophils Relative: 1 % (ref 0–5)
HCT: 47.7 % (ref 39.0–52.0)
Lymphocytes Relative: 11 % — ABNORMAL LOW (ref 12–46)
Lymphs Abs: 0.7 10*3/uL (ref 0.7–4.0)
MCH: 29.5 pg (ref 26.0–34.0)
MCV: 84.3 fL (ref 78.0–100.0)
Monocytes Absolute: 0.6 10*3/uL (ref 0.1–1.0)
RBC: 5.66 MIL/uL (ref 4.22–5.81)
WBC: 6.2 10*3/uL (ref 4.0–10.5)

## 2013-08-06 LAB — CG4 I-STAT (LACTIC ACID): Lactic Acid, Venous: 3.07 mmol/L — ABNORMAL HIGH (ref 0.5–2.2)

## 2013-08-06 MED ORDER — POTASSIUM CHLORIDE CRYS ER 20 MEQ PO TBCR: 20.0000 meq | EXTENDED_RELEASE_TABLET | Freq: Once | ORAL | Status: DC

## 2013-08-06 MED ORDER — SODIUM CHLORIDE 0.9 % IV BOLUS (SEPSIS)
1000.0000 mL | Freq: Once | INTRAVENOUS | Status: AC
  Administered 2013-08-06: 1000 mL via INTRAVENOUS

## 2013-08-06 MED ORDER — POTASSIUM CHLORIDE 10 MEQ/100ML IV SOLN
10.0000 meq | INTRAVENOUS | Status: AC
  Administered 2013-08-06 – 2013-08-07 (×2): 10 meq via INTRAVENOUS
  Filled 2013-08-06 (×2): qty 100

## 2013-08-06 NOTE — ED Notes (Signed)
Per Family report: Since Thursday evening pt has had severe diarrhea and vomiting.  Pt now c/o of SOB and chest pain.  Family reports pt has been grabbing at the air and is having difficulty hearing.  Pt a/o x 4.  Skin warm and dry.  Pt hx of pleurisy and neck pain.

## 2013-08-06 NOTE — ED Notes (Signed)
Patient transported to X-ray 

## 2013-08-06 NOTE — ED Notes (Signed)
Pt made aware of need for a urine sample 

## 2013-08-06 NOTE — ED Provider Notes (Signed)
CSN: [id number]     Arrival date & time 08/06/13  2216 History   First MD Initiated Contact with Patient 08/06/13 2232     Chief Complaint  Patient presents with  . Emesis  . Diarrhea   (Consider location/radiation/quality/duration/timing/severity/associated sxs/prior Treatment) Patient is a 42 y.o. male presenting with vomiting and diarrhea. The history is provided by the patient.  Emesis Severity:  Severe Duration:  4 days Timing:  Intermittent Number of daily episodes:  Multiple Quality:  Bilious material Able to tolerate:  Liquids Progression:  Unchanged Chronicity:  New Recent urination:  Normal Relieved by:  Nothing Associated symptoms: abdominal pain, chills, cough, diarrhea and fever   Cough:    Cough characteristics:  Non-productive   Severity:  Mild   Onset quality:  Sudden   Duration:  1 day   Timing:  Intermittent   Chronicity:  New Diarrhea:    Quality:  Mucous and watery   Number of occurrences:  Multipl   Severity:  Severe   Timing:  Intermittent   Progression:  Unchanged Risk factors: no alcohol use, no diabetes, no prior abdominal surgery, no sick contacts, no suspect food intake and no travel to endemic areas   Diarrhea Associated symptoms: abdominal pain, chills, cough, fever and vomiting     Past Medical History  Diagnosis Date  . Neck pain   . Pleurisy    Past Surgical History  Procedure Laterality Date  . Wisdom tooth extraction     No family history on file. History  Substance Use Topics  . Smoking status: Never Smoker   . Smokeless tobacco: Never Used  . Alcohol Use: No    Review of Systems  Constitutional: Positive for fever and chills.  Respiratory: Positive for cough and shortness of breath.   Gastrointestinal: Positive for vomiting, abdominal pain and diarrhea.  Genitourinary: Negative for decreased urine volume.  Neurological: Negative for dizziness.  All other systems reviewed and are negative.    Allergies  Review of  patient's allergies indicates no known allergies.  Home Medications   Current Outpatient Rx  Name  Route  Sig  Dispense  Refill  . cyclobenzaprine (FLEXERIL) 10 MG tablet      TAKE 1 TABLET BY MOUTH EVERY 8 HOURS AS NEEDED FO RMUSCLE SPASMS   90 tablet   1   . Ibuprofen-Diphenhydramine HCl (ADVIL PM) 200-25 MG CAPS   Oral   Take 1 capsule by mouth every 6 (six) hours as needed (cold symptoms).         . metoprolol succinate (TOPROL-XL) 50 MG 24 hr tablet      TAKE 1 TABLET (50 MG TOTAL) BY MOUTH DAILY.   30 tablet   11   . sertraline (ZOLOFT) 50 MG tablet      One and one half tablets daily   45 tablet   11   . ondansetron (ZOFRAN ODT) 4 MG disintegrating tablet   Oral   Take 1 tablet (4 mg total) by mouth every 8 (eight) hours as needed for nausea.   20 tablet   0    BP 117/100  Pulse 76  Temp(Src) 97.8 F (36.6 C) (Oral)  Resp 24  SpO2 94% Physical Exam  Nursing note and vitals reviewed. Constitutional: He appears well-developed and well-nourished.  HENT:  Head: Normocephalic and atraumatic.  Eyes: Pupils are equal, round, and reactive to light.  Neck: Normal range of motion.  Cardiovascular: Regular rhythm.  Tachycardia present.   Pulmonary/Chest: Effort normal and  breath sounds normal. He exhibits no tenderness.  Abdominal: Bowel sounds are normal. He exhibits no distension. There is generalized tenderness.  Musculoskeletal: Normal range of motion.  Skin: Skin is warm. No rash noted. No erythema. No pallor.    ED Course  Procedures (including critical care time) Labs Review Labs Reviewed  CBC WITH DIFFERENTIAL - Abnormal; Notable for the following:    Neutrophils Relative % 78 (*)    Lymphocytes Relative 11 (*)    All other components within normal limits  URINALYSIS, ROUTINE W REFLEX MICROSCOPIC - Abnormal; Notable for the following:    Specific Gravity, Urine 1.031 (*)    All other components within normal limits  POCT I-STAT, CHEM 8 -  Abnormal; Notable for the following:    Potassium 3.1 (*)    Creatinine, Ser 1.50 (*)    Glucose, Bld 139 (*)    Hemoglobin 17.7 (*)    All other components within normal limits  CG4 I-STAT (LACTIC ACID) - Abnormal; Notable for the following:    Lactic Acid, Venous 3.07 (*)    All other components within normal limits   Imaging Review Dg Abd Acute W/chest  08/06/2013   *RADIOLOGY REPORT*  Clinical Data: Shortness of breath, abdominal pain, and vomiting  ACUTE ABDOMEN SERIES (ABDOMEN 2 VIEW & CHEST 1 VIEW)  Comparison: Chest radiograph 05/30/2009  Findings: Numerous cardiac leads project over the chest.  The heart, mediastinal, and hilar contours are within normal limits. 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  Negative for pleural effusion.  No free intraperitoneal air is identified.  The bowel gas pattern is nonobstructive. No No significant stool burden. No radiopaque urinary tract calculus is identified.  There is some high-density particulate matter scattered in bowel loops.  No acute bony abnormalities identified.  IMPRESSION:  1.  No acute cardiopulmonary disease. 2.  Nonobstructive bowel gas pattern.   Original Report Authenticated By: Britta Mccreedy, M.D.    MDM   1. Nausea vomiting and diarrhea        Arman Filter, NP 08/07/13 1947

## 2013-08-07 LAB — URINALYSIS, ROUTINE W REFLEX MICROSCOPIC
Bilirubin Urine: NEGATIVE
Glucose, UA: NEGATIVE mg/dL
Hgb urine dipstick: NEGATIVE
Ketones, ur: NEGATIVE mg/dL
pH: 5.5 (ref 5.0–8.0)

## 2013-08-07 MED ORDER — ONDANSETRON 4 MG PO TBDP: 4.0000 mg | ORAL_TABLET | Freq: Three times a day (TID) | ORAL | Status: DC | PRN

## 2013-08-07 MED ORDER — ONDANSETRON HCL 4 MG/2ML IJ SOLN
4.0000 mg | Freq: Once | INTRAMUSCULAR | Status: AC
  Administered 2013-08-07: 4 mg via INTRAVENOUS
  Filled 2013-08-07: qty 2

## 2013-08-07 MED ORDER — POTASSIUM CHLORIDE 10 MEQ/100ML IV SOLN: 10.0000 meq | INTRAVENOUS | Status: DC

## 2013-08-07 MED ORDER — SODIUM CHLORIDE 0.9 % IV BOLUS (SEPSIS): 1000.0000 mL | Freq: Once | INTRAVENOUS | Status: DC

## 2013-08-07 NOTE — ED Notes (Signed)
Pt reports feeling nauseous and dizzy.

## 2013-08-07 NOTE — ED Notes (Signed)
Dondra Spry, NP made aware of pt's increasing abd pain.

## 2013-08-08 ENCOUNTER — Telehealth: Payer: Self-pay | Admitting: Family Medicine

## 2013-08-08 NOTE — ED Provider Notes (Signed)
Medical screening examination/treatment/procedure(s) were performed by non-physician practitioner and as supervising physician I was immediately available for consultation/collaboration.  Hetal Proano, MD 08/08/13 2133 

## 2013-08-08 NOTE — Telephone Encounter (Addendum)
Sent call to triage For SOB

## 2013-08-08 NOTE — Telephone Encounter (Signed)
Patient Information:  Caller Name: Kenney Houseman  Phone: [phone]  Patient: Harold Robinson, Harold Robinson  Gender: Male  DOB: 12-Jun-1971  Age: 42 Years  PCP: [facility])  Office Follow Up:  Does the office need to follow up with this patient?: No  Instructions For The Office: N/A   Symptoms  Reason For Call & Symptoms: Wife/Tanya called to schedule ED follow up;  Seen in [facility] ED 08/06/13 for blurred vision, diarrhea and chills.  Instructed to follow up with PCP ASAP.  No change in condition.  Transferred to Norma/office scheduler for ED follow up appointment.  Reviewed Health History In EMR: N/A  Reviewed Medications In EMR: N/A  Reviewed Allergies In EMR: N/A  Reviewed Surgeries / Procedures: N/A  Date of Onset of Symptoms: 08/06/2013  Guideline(s) Used:  No Protocol Available - Information Only  Disposition Per Guideline:   Callback by PCP Today  Reason For Disposition Reached:   Nursing judgment  Advice Given:  Call Back If:  New symptoms develop  RN Overrode Recommendation:  Make Appointment  Transferred to office for ED follow up appointment.

## 2013-08-09 ENCOUNTER — Encounter: Payer: Self-pay | Admitting: Family Medicine

## 2013-08-09 ENCOUNTER — Ambulatory Visit (INDEPENDENT_AMBULATORY_CARE_PROVIDER_SITE_OTHER): Payer: BC Managed Care – PPO | Admitting: Family Medicine

## 2013-08-09 VITALS — BP 122/74 | HR 64 | Temp 98.4°F | Wt 214.0 lb

## 2013-08-09 DIAGNOSIS — A084 Viral intestinal infection, unspecified: Secondary | ICD-10-CM

## 2013-08-09 DIAGNOSIS — R1310 Dysphagia, unspecified: Secondary | ICD-10-CM

## 2013-08-09 DIAGNOSIS — A088 Other specified intestinal infections: Secondary | ICD-10-CM

## 2013-08-09 DIAGNOSIS — E876 Hypokalemia: Secondary | ICD-10-CM

## 2013-08-09 DIAGNOSIS — R400 Somnolence: Secondary | ICD-10-CM

## 2013-08-09 DIAGNOSIS — G4710 Hypersomnia, unspecified: Secondary | ICD-10-CM

## 2013-08-09 NOTE — Progress Notes (Signed)
  Subjective:    Patient ID: Harold Robinson, male    DOB: Jun 08, 1971, 42 y.o.   MRN: [id number]  HPI Patient here for several issues as follows  Recent emergency visit on 08/06/2013 for nausea and vomiting and diarrhea.  He developed symptoms couple days prior to emergency room visit. He complained of some diffuse abdominal cramping, fatigue, chills, fever and nonbloody diarrhea. Patient had lab work significant for hypokalemia with potassium 3.1. Specific gravity urine 1.031. He was apparently given 3 bags of IV fluids. Acute abdominal series revealed no acute abnormality.  He's had some generalized weakness but is gradually recovering. He has not had any further vomiting or fever since emergency room visit. He brings up 2 other items for discussion today  He relates solid food dysphagia over the past several months with some symptoms almost daily. Sometimes has to regurgitate meats. Denies any active GERD symptoms. No appetite or weight changes. No dysphagia to liquids.  Second issue is he has frequent daytime somnolence and fatigue. Wife notes that he snores fairly loudly and frequently has episodes of silence where he seems to quit breathing for several seconds at a time. This is sometimes followed by gasping. He has never had any sleep studies.  Past Medical History  Diagnosis Date  . Neck pain   . Pleurisy    Past Surgical History  Procedure Laterality Date  . Wisdom tooth extraction      reports that he has never smoked. He has never used smokeless tobacco. He reports that he does not drink alcohol or use illicit drugs. family history is not on file. No Known Allergies    Review of Systems  Constitutional: Positive for fatigue.  HENT: Positive for trouble swallowing. Negative for sore throat and voice change.   Cardiovascular: Negative for chest pain.  Gastrointestinal: Positive for diarrhea. Negative for abdominal pain.  Endocrine: Negative for polydipsia and polyuria.   Genitourinary: Negative for dysuria.  Neurological: Negative for dizziness and syncope.  Hematological: Negative for adenopathy.       Objective:   Physical Exam  Constitutional: He appears well-developed and well-nourished.  HENT:  Mouth/Throat: Oropharynx is clear and moist.  Neck: Neck supple. No thyromegaly present.  Cardiovascular: Normal rate and regular rhythm.   Pulmonary/Chest: Effort normal and breath sounds normal. No respiratory distress. He has no wheezes. He has no rales.  Abdominal: Soft. Bowel sounds are normal. He exhibits no distension and no mass. There is no tenderness. There is no rebound and no guarding.  Musculoskeletal: He exhibits no edema.          Assessment & Plan:  #1 recent viral gastroenteritis. Symptomatically improved. Taper off Zofran. Clinically, does not appear dehydrated at this time #2 recent hypokalemia secondary to #1. Recheck basic metabolic panel. His oral intake is slowly improving #3 probable obstructive sleep apnea.  Epworth sleep scale 17.  Set up split sleep study #4 solid food dysphagia. Progressive over several months. Set up GI referral for further evaluation. He does not have any red flags such as appetite or weight changes.

## 2013-08-10 ENCOUNTER — Other Ambulatory Visit: Payer: Self-pay | Admitting: Family Medicine

## 2013-08-10 DIAGNOSIS — E876 Hypokalemia: Secondary | ICD-10-CM

## 2013-08-10 LAB — BASIC METABOLIC PANEL
BUN: 11 mg/dL (ref 6–23)
CO2: 27 mEq/L (ref 19–32)
Chloride: 103 mEq/L (ref 96–112)
Creatinine, Ser: 1.1 mg/dL (ref 0.4–1.5)

## 2013-08-11 ENCOUNTER — Other Ambulatory Visit: Payer: BC Managed Care – PPO

## 2013-08-11 LAB — BASIC METABOLIC PANEL
BUN: 11 mg/dL (ref 6–23)
GFR: 90.64 mL/min (ref >60.00)
Glucose, Bld: 106 mg/dL — ABNORMAL HIGH (ref 70–99)
Potassium: 3.3 mEq/L — ABNORMAL LOW (ref 3.5–5.1)

## 2013-08-11 NOTE — Addendum Note (Signed)
Addended by: Rita Ohara R on: 08/11/2013 03:02 PM   Modules accepted: Orders

## 2013-08-17 ENCOUNTER — Encounter: Payer: Self-pay | Admitting: Gastroenterology

## 2013-08-21 ENCOUNTER — Other Ambulatory Visit: Payer: Self-pay | Admitting: Family Medicine

## 2013-08-22 ENCOUNTER — Other Ambulatory Visit: Payer: Self-pay | Admitting: Family Medicine

## 2013-09-12 ENCOUNTER — Ambulatory Visit (INDEPENDENT_AMBULATORY_CARE_PROVIDER_SITE_OTHER): Payer: BC Managed Care – PPO | Admitting: Gastroenterology

## 2013-09-12 ENCOUNTER — Encounter: Payer: Self-pay | Admitting: Gastroenterology

## 2013-09-12 ENCOUNTER — Other Ambulatory Visit (INDEPENDENT_AMBULATORY_CARE_PROVIDER_SITE_OTHER): Payer: BC Managed Care – PPO

## 2013-09-12 VITALS — BP 130/84 | HR 66 | Ht 70.5 in | Wt 227.0 lb

## 2013-09-12 DIAGNOSIS — R109 Unspecified abdominal pain: Secondary | ICD-10-CM

## 2013-09-12 DIAGNOSIS — E739 Lactose intolerance, unspecified: Secondary | ICD-10-CM

## 2013-09-12 DIAGNOSIS — R197 Diarrhea, unspecified: Secondary | ICD-10-CM

## 2013-09-12 DIAGNOSIS — R112 Nausea with vomiting, unspecified: Secondary | ICD-10-CM

## 2013-09-12 DIAGNOSIS — R1319 Other dysphagia: Secondary | ICD-10-CM

## 2013-09-12 DIAGNOSIS — R21 Rash and other nonspecific skin eruption: Secondary | ICD-10-CM

## 2013-09-12 LAB — CBC WITH DIFFERENTIAL/PLATELET
Basophils Relative: 0.5 % (ref 0.0–3.0)
Eosinophils Relative: 4 % (ref 0.0–5.0)
Hemoglobin: 14.5 g/dL (ref 13.0–17.0)
Lymphocytes Relative: 25.2 % (ref 12.0–46.0)
Monocytes Relative: 8.3 % (ref 3.0–12.0)
Neutro Abs: 4.4 10*3/uL (ref 1.4–7.7)
RBC: 5.01 Mil/uL (ref 4.22–5.81)

## 2013-09-12 LAB — BASIC METABOLIC PANEL
CO2: 28 mEq/L (ref 19–32)
Calcium: 9.1 mg/dL (ref 8.4–10.5)
Creatinine, Ser: 1.1 mg/dL (ref 0.4–1.5)
Glucose, Bld: 108 mg/dL — ABNORMAL HIGH (ref 70–99)

## 2013-09-12 LAB — VITAMIN B12: Vitamin B-12: 204 pg/mL — ABNORMAL LOW (ref 211–911)

## 2013-09-12 LAB — HEPATIC FUNCTION PANEL
Albumin: 4 g/dL (ref 3.5–5.2)
Bilirubin, Direct: 0.1 mg/dL (ref 0.0–0.3)
Total Protein: 7.1 g/dL (ref 6.0–8.3)

## 2013-09-12 LAB — FOLATE: Folate: 10.3 ng/mL (ref >5.9)

## 2013-09-12 LAB — IBC PANEL
Saturation Ratios: 20.8 % (ref 20.0–50.0)
Transferrin: 240 mg/dL (ref 212.0–360.0)

## 2013-09-12 MED ORDER — PEG-KCL-NACL-NASULF-NA ASC-C 100 G PO SOLR: 1.0000 | Freq: Once | ORAL | Status: DC

## 2013-09-12 NOTE — Patient Instructions (Addendum)
Your physician has requested that you go to the basement for lab work before leaving today.  You have been scheduled for an endoscopy and colonoscopy with propofol. Please follow the written instructions given to you at your visit today. Please pick up your prep at the pharmacy within the next 1-3 days. If you use inhalers (even only as needed), please bring them with you on the day of your procedure.

## 2013-09-12 NOTE — Progress Notes (Signed)
History of Present Illness:  This is a 42 year old Caucasian male referred by primary care for evaluation of diarrhea, nausea vomiting, acid reflux symptoms, questionable dysphagia, all of unexplained etiology.  Patient's company by his wife throughout the interview and exam.  They relate the patient has had abnormal bowel habits with nausea, vague diffuse abdominal pain, postprandial stooling since birth.  However, there is no family history of known gastrointestinal problems.  He does have lactose intolerance.  He currently describes crampy lower abdominal pain with postprandial loose watery stools without melena or hematochezia, anorexia,  or any chronic weight loss.  He denies true dyspepsia but has had some intermittent" gagging and shortness of breath" with eating without true dysphagia.  Apparently had a one-month trial of PPI therapy without improvement.  Other complaints include daytime somnolence and fatigue, and sleep apnea study has been scheduled.  He has no history of known gallbladder liver disease.  He does have recurrent papular skin rash on his abdomen and anterior thighs.  He denies known infectious disease exposure sick family members at on.  He also denies stress related diarrhea, but it is noted that he is on Zoloft 75 mg/day.  Reviewing his records, his had some problems with hypokalemia but no other metabolic abnormalities have been noted except for slightly elevated blood sugar.  Has not had previous stool exams or barium studies or CT scans.  I have reviewed this patient's present history, medical and surgical past history, allergies and medications.     ROS:   All systems were reviewed and are negative unless otherwise stated in the HPI.    Physical Exam: Blood pressure 130/84, pulse 66 and regular and weight 227 with a BMI of 32.1. General well developed well nourished patient in no acute distress, appearing his stated age Eyes PERRLA, no icterus, fundoscopic exam per  opthamologist Skin no lesions noted Neck supple, no adenopathy, no thyroid enlargement, no tenderness Chest clear to percussion and auscultation Heart no significant murmurs, gallops or rubs noted Abdomen no hepatosplenomegaly masses or tenderness, BS normal.  Rectal inspection normal no fissures, or fistulae noted.  No masses or tenderness on digital exam. Stool guaiac negative.  Very liquid loose stool noted. Extremities no acute joint lesions, edema, phlebitis or evidence of cellulitis.  Papular rash on his anterior thighs noted. Neurologic patient oriented x 3, cranial nerves intact, no focal neurologic deficits noted. Psychological mental status normal and normal affect.  Assessment and plan: Chronic diffuse GI complaints, mostly manifested by abdominal cramping and diarrhea.  I'm not sure that he really has true dysphagia, and certainly does not have chronic GERD, and had no response in terms of his GI complaints with a month-longperiod of PPI therapy.  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  He does have lactose intolerance and avoids major lactose products.  I've gone ahead and scheduled him for endoscopy and colonoscopy with small bowel, gastric, and colon biopsies.  The patient does workup, he may benefit from an empiric trial metronidazole therapy.  Celiac profile repeat labs ordered today for review including serum potassium level.  Skin rash noted which is probably atopic in nature, but if this persist dermatologic evaluation would be in order to exclude dermatitis herpetiformis.

## 2013-09-13 ENCOUNTER — Telehealth: Payer: Self-pay | Admitting: *Deleted

## 2013-09-13 ENCOUNTER — Encounter: Payer: Self-pay | Admitting: Gastroenterology

## 2013-09-13 ENCOUNTER — Ambulatory Visit (AMBULATORY_SURGERY_CENTER): Payer: BC Managed Care – PPO | Admitting: Gastroenterology

## 2013-09-13 VITALS — BP 135/82 | HR 55 | Temp 97.2°F | Resp 23 | Ht 70.5 in | Wt 227.0 lb

## 2013-09-13 DIAGNOSIS — Z1211 Encounter for screening for malignant neoplasm of colon: Secondary | ICD-10-CM

## 2013-09-13 DIAGNOSIS — D1330 Benign neoplasm of unspecified part of small intestine: Secondary | ICD-10-CM

## 2013-09-13 DIAGNOSIS — K219 Gastro-esophageal reflux disease without esophagitis: Secondary | ICD-10-CM

## 2013-09-13 DIAGNOSIS — E538 Deficiency of other specified B group vitamins: Secondary | ICD-10-CM

## 2013-09-13 DIAGNOSIS — R1319 Other dysphagia: Secondary | ICD-10-CM

## 2013-09-13 DIAGNOSIS — R197 Diarrhea, unspecified: Secondary | ICD-10-CM

## 2013-09-13 DIAGNOSIS — D126 Benign neoplasm of colon, unspecified: Secondary | ICD-10-CM

## 2013-09-13 DIAGNOSIS — R079 Chest pain, unspecified: Secondary | ICD-10-CM

## 2013-09-13 MED ORDER — SODIUM CHLORIDE 0.9 % IV SOLN: 500.0000 mL | INTRAVENOUS | Status: DC

## 2013-09-13 MED ORDER — CILIDINIUM-CHLORDIAZEPOXIDE 2.5-5 MG PO CAPS: ORAL_CAPSULE | ORAL | Status: DC

## 2013-09-13 MED ORDER — ESOMEPRAZOLE MAGNESIUM 40 MG PO CPDR: DELAYED_RELEASE_CAPSULE | ORAL | Status: DC

## 2013-09-13 MED ORDER — CYANOCOBALAMIN 1000 MCG/ML IJ SOLN: INTRAMUSCULAR | Status: DC

## 2013-09-13 NOTE — Patient Instructions (Signed)
Biopsies taken today, await pathology results.  Continue current medications. Pick up new RX from your pharmacy. B12 shots will be scheduled. Handouts given. Call us with any questions or concerns. Thank you!  YOU HAD AN ENDOSCOPIC PROCEDURE TODAY AT THE [facility]: Refer to the procedure report that was given to you for any specific questions about what was found during the examination.  If the procedure report does not answer your questions, please call your gastroenterologist to clarify.  If you requested that your care partner not be given the details of your procedure findings, then the procedure report has been included in a sealed envelope for you to review at your convenience later.  YOU SHOULD EXPECT: Some feelings of bloating in the abdomen. Passage of more gas than usual.  Walking can help get rid of the air that was put into your GI tract during the procedure and reduce the bloating. If you had a lower endoscopy (such as a colonoscopy or flexible sigmoidoscopy) you may notice spotting of blood in your stool or on the toilet paper. If you underwent a bowel prep for your procedure, then you may not have a normal bowel movement for a few days.  DIET: Your first meal following the procedure should be a light meal and then it is ok to progress to your normal diet.  A half-sandwich or bowl of soup is an example of a good first meal.  Heavy or fried foods are harder to digest and may make you feel nauseous or bloated.  Likewise meals heavy in dairy and vegetables can cause extra gas to form and this can also increase the bloating.  Drink plenty of fluids but you should avoid alcoholic beverages for 24 hours.  ACTIVITY: Your care partner should take you home directly after the procedure.  You should plan to take it easy, moving slowly for the rest of the day.  You can resume normal activity the day after the procedure however you should NOT DRIVE or use heavy machinery for 24 hours  (because of the sedation medicines used during the test).    SYMPTOMS TO REPORT IMMEDIATELY: A gastroenterologist can be reached at any hour.  During normal business hours, 8:30 AM to 5:00 PM Monday through Friday, call [phone].  After hours and on weekends, please call the GI answering service at [phone] who will take a message and have the physician on call contact you.   Following lower endoscopy (colonoscopy or flexible sigmoidoscopy):  Excessive amounts of blood in the stool  Significant tenderness or worsening of abdominal pains  Swelling of the abdomen that is new, acute  Fever of 100F or higher  Following upper endoscopy (EGD)  Vomiting of blood or coffee ground material  New chest pain or pain under the shoulder blades  Painful or persistently difficult swallowing  New shortness of breath  Fever of 100F or higher  Black, tarry-looking stools  FOLLOW UP: If any biopsies were taken you will be contacted by phone or by letter within the next 1-3 weeks.  Call your gastroenterologist if you have not heard about the biopsies in 3 weeks.  Our staff will call the home number listed on your records the next business day following your procedure to check on you and address any questions or concerns that you may have at that time regarding the information given to you following your procedure. This is a courtesy call and so if there is no answer at the home  number and we have not heard from you through the emergency physician on call, we will assume that you have returned to your regular daily activities without incident.  SIGNATURES/CONFIDENTIALITY: You and/or your care partner have signed paperwork which will be entered into your electronic medical record.  These signatures attest to the fact that that the information above on your After Visit Summary has been reviewed and is understood.  Full responsibility of the confidentiality of this discharge information lies with you  and/or your care-partner.

## 2013-09-13 NOTE — Telephone Encounter (Signed)
Informed Harold Robinson in [facility] of f/u appt and new meds ordered; I will call pt tomorrow with B12 information.

## 2013-09-13 NOTE — Progress Notes (Signed)
No egg or soy allergy. ewm 

## 2013-09-13 NOTE — Progress Notes (Signed)
Called to room to assist during endoscopic procedure.  Patient ID and intended procedure confirmed with present staff. Received instructions for my participation in the procedure from the performing physician.   When I came into the procedure room, Iline Oven, CRNA asked me to hold the patients arm.  Pt was thrashing his arms around, would not keep the sat monitor on his hand.  Maw

## 2013-09-13 NOTE — Op Note (Signed)
 [facility] [address] [place], [address]   COLONOSCOPY PROCEDURE REPORT  PATIENT: Harold Robinson, Harold Robinson  MR#: [id number] BIRTHDATE: [birth year] , 42  yrs. old GENDER: Male ENDOSCOPIST: Mardella Layman, MD, [facility] REFERRED OZ:[id number] Caryl Never, M.D. PROCEDURE DATE:  09/13/2013 PROCEDURE:   Colonoscopy, diagnostic First Screening Colonoscopy - Avg.  risk and is 50 yrs.  old or older - No.  Prior Negative Screening - Now for repeat screening. N/A  History of Adenoma - Now for follow-up colonoscopy & has been > or = to 3 yrs.  N/A ASA CLASS:   Class II INDICATIONS:Bloating and Change in bowel habits. MEDICATIONS: propofol (Diprivan) 300mg  IV  DESCRIPTION OF PROCEDURE:   After the risks benefits and alternatives of the procedure were thoroughly explained, informed consent was obtained.  A digital rectal exam revealed no abnormalities of the rectum.   The LB [id number]  endoscope was introduced through the anus and advanced to the terminal ileum which was intubated for a short distance. No adverse events experienced.   The quality of the prep was excellent, using MoviPrep  The instrument was then slowly withdrawn as the colon was fully examined.      COLON FINDINGS: A normal appearing cecum, ileocecal valve, and appendiceal orifice were identified.  The ascending, hepatic flexure, transverse, splenic flexure, descending, sigmoid colon and rectum appeared unremarkable.  No polyps or cancers were seen. Terminal ieum also appears normal.Biopsise every 10 cm. done..... Retroflexed views revealed no abnormalities. The time to cecum=2 minutes 31 seconds.  Withdrawal time=13 minutes 08 seconds.  The scope was withdrawn and the procedure completed. COMPLICATIONS: There were no complications.  ENDOSCOPIC IMPRESSION: Normal colon and ileum...r/o microscopic colitis (doubt),probable severe IBS diarrhea.  RECOMMENDATIONS: 1.  Await biopsy results 2.  Upper  endoscopy will be scheduled 3. B12 parenteral replacement  eSigned:  Mardella Layman, MD, [facility] 09/13/2013 2:23 PM   cc:   PATIENT NAME:  Bynum, Mccullars MR#: [id number]

## 2013-09-13 NOTE — Progress Notes (Signed)
Patient did not have preoperative order for IV antibiotic SSI prophylaxis. (G8918)  Patient did not experience any of the following events: a burn prior to discharge; a fall within the facility; wrong site/side/patient/procedure/implant event; or a hospital transfer or hospital admission upon discharge from the facility. (G8907)  

## 2013-09-13 NOTE — Op Note (Signed)
[facility] [address] [place], [address]   ENDOSCOPY PROCEDURE REPORT  PATIENT: [name], [name]  MR#: [id number] BIRTHDATE: 09-Feb-1971 , 42  yrs. old GENDER: Male ENDOSCOPIST:Jalei Shibley Hale Bogus, MD, [facility] REFERRED BY: Evelena Peat, M.D. PROCEDURE DATE:  09/13/2013 PROCEDURE:   EGD w/ biopsy and EGD w/ biopsy for H.pylori ASA CLASS:    Class II INDICATIONS: Chest pain, Nausea, and History of esophageal reflux. MEDICATION: There was residual sedation effect present from prior procedure and Propofol (Diprivan) 180 mg IV TOPICAL ANESTHETIC:  DESCRIPTION OF PROCEDURE:   After the risks and benefits of the procedure were explained, informed consent was obtained.  The LB [id number]  endoscope was introduced through the mouth  and advanced to the    .  The instrument was slowly withdrawn as the mucosa was fully examined.      DUODENUM: The duodenal mucosa showed no abnormalities in the bulb and second portion of the duodenum.  Cold forceps biopsies were taken in the bulb and second portion.  STOMACH: There was mild antral gastropathy noted.  ESOPHAGUS: A 3 cm hiatal hernia was noted.   There was LA Class C esophagitis noted. Numerous erosions in distalesophagus noted...see pictures. CLO Bx. done.   Retroflexed views revealed a hiatal hernia.    The scope was then withdrawn from the patient and the procedure completed.Procedure tolerated poorly with reflux throughout the procedure.  COMPLICATIONS: There were no complications.   ENDOSCOPIC IMPRESSION: 1.   The duodenal mucosa showed no abnormalities in the bulb and second portion of the duodenum ...biopsies for celiac disease done. 2.   There was mild antral gastropathy noted [T2].Marland KitchenMarland KitchenR/O H.pylori infection 3.   3 cm hiatal hernia and chronic GERD 4.   There was LA Class C esophagitis noted  RECOMMENDATIONS: 1.  Await biopsy results 2.  Continue current medications 3.  PPI bid.Marland Kitchennexium 40 mg  bid 4. Needs B12 shots .5. Librax 1 tid prn  _______________________________ eSigned:  Mardella Layman, MD, [facility] 09/13/2013 2:33 PM   standard discharge   PATIENT NAME:  Harold Robinson, Harold Robinson MR#: [id number]

## 2013-09-13 NOTE — Progress Notes (Signed)
stable °

## 2013-09-14 ENCOUNTER — Telehealth: Payer: Self-pay | Admitting: Gastroenterology

## 2013-09-14 ENCOUNTER — Telehealth: Payer: Self-pay | Admitting: Family Medicine

## 2013-09-14 ENCOUNTER — Telehealth: Payer: Self-pay | Admitting: *Deleted

## 2013-09-14 NOTE — Telephone Encounter (Signed)
We can try Proventil 2 puffs every 4 hours prn cough and wheeze.    He will need follow up if this is not helping. This inhaler should be used only as "rescue inhaler" and not regularly.  Fill #1 inhaler with one refill.
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
## 2013-09-14 NOTE — Telephone Encounter (Signed)
  Follow up Call-  Call back number 09/13/2013  Post procedure Call Back phone  # [phone]  Permission to leave phone message Yes     No answer but left message on answering machine to call back if experiencing any problems or has any questions

## 2013-09-14 NOTE — Telephone Encounter (Signed)
I called [facility], gave them the information on the savings card  ZOX:[id number] PCN:CN VWU:[id number] NF:[id number]

## 2013-09-14 NOTE — Telephone Encounter (Signed)
Spoke with wife who stated the Nexium was too expensive, so they bought the OTC. Informed pt the OTC is half the dose and she states they bought enough for the 40MG  BID dose. Faxed a discount card to [facility] that will hopefully help. Wife reports she got the B12 injections and they have someone to give the injections. lmom for pt to call back.

## 2013-09-14 NOTE — Telephone Encounter (Signed)
Spoke with wife to inform herordered syringes and needles for administration. Pt will call for further problems.

## 2013-09-15 LAB — HELICOBACTER PYLORI SCREEN-BIOPSY: UREASE: NEGATIVE

## 2013-09-15 MED ORDER — ALBUTEROL SULFATE HFA 108 (90 BASE) MCG/ACT IN AERS: 2.0000 | INHALATION_SPRAY | RESPIRATORY_TRACT | Status: DC | PRN

## 2013-09-15 NOTE — Telephone Encounter (Signed)
Left message on patient VM that inhaler was sent to pharmacy

## 2013-09-18 ENCOUNTER — Encounter: Payer: Self-pay | Admitting: Gastroenterology

## 2013-09-19 ENCOUNTER — Encounter: Payer: Self-pay | Admitting: Gastroenterology

## 2013-09-22 ENCOUNTER — Encounter (HOSPITAL_BASED_OUTPATIENT_CLINIC_OR_DEPARTMENT_OTHER): Payer: BC Managed Care – PPO

## 2013-10-05 ENCOUNTER — Encounter: Payer: Self-pay | Admitting: *Deleted

## 2013-10-13 ENCOUNTER — Encounter: Payer: Self-pay | Admitting: Gastroenterology

## 2013-10-13 ENCOUNTER — Ambulatory Visit (INDEPENDENT_AMBULATORY_CARE_PROVIDER_SITE_OTHER): Payer: BC Managed Care – PPO | Admitting: Gastroenterology

## 2013-10-13 VITALS — BP 150/80 | HR 66 | Ht 70.5 in | Wt 226.8 lb

## 2013-10-13 DIAGNOSIS — R197 Diarrhea, unspecified: Secondary | ICD-10-CM

## 2013-10-13 DIAGNOSIS — E538 Deficiency of other specified B group vitamins: Secondary | ICD-10-CM

## 2013-10-13 DIAGNOSIS — K219 Gastro-esophageal reflux disease without esophagitis: Secondary | ICD-10-CM

## 2013-10-13 MED ORDER — CYANOCOBALAMIN 1000 MCG/ML IJ SOLN: 1000.0000 ug | Freq: Once | INTRAMUSCULAR | Status: DC

## 2013-10-13 NOTE — Progress Notes (Signed)
This is a 42 year old Caucasian male who has had chronic bowel dysfunction since birth. He knows he now complains of crampy lower abdominal pain with 5-6 loose bowel movements a day partially relieved by Librax. He has upper GI acid reflux symptoms that are managed with his omeprazole 40 mg twice a day. Recent endoscopy and colonoscopy were fairly unremarkable except for redness of esophagitis. Biopsies of his gastric mucosa were negative for H. pylori. There is no evidence of celiac disease or duodenal biopsies, and colonoscopy was unremarkable including random colon biopsies. Extensive labs were unremarkable except for a low serum B12 level, and he is on parenteral B12 replacement therapy. Patient is accompanied again today by his wife. They both deny stress plays a role in this patient's illness. He does have lactose intolerance, and his wife says he" eats whatever he wants". There is been no anorexia, weight loss, fever, chills, skin rashes, joint pains, ophthalmologic problems, or any other systemic complaints. Family history is noncontributory. The patient continues to deny use of sorbitol or fructose in his diet. There is no evidence of peripheral neuropathy related to his B12 deficiency.  Current Medications, Allergies, Past Medical History, Past Surgical History, Family History and Social History were reviewed in [employer] record.  ROS: All systems were reviewed and are negative unless otherwise stated in the HPI.          Physical Exam: Healthy-appearing male in no acute distress much, much calmer than seen on his previous examination. Blood pressure 150/80, pulse 66 and regular weight 226 with a BMI of 32.07. Patient does not smoke. There is no hepatosplenomegaly, abdominal masses or tenderness. There is some exogenous obesity noted. Bowel sounds are normal. Mental status is normal.    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  However, his B12 deficiency certainly raises the possibility of bacterial overgrowth syndrome, and I have decided to treat him with Xifaxan 550 mg twice a day for 2 weeks with VSL #3 probiotic therapy for 2 weeks, continue his PPI therapy qAM and bid as needed, and decreased Librax to when necessary use. Also have prescribed a lactose-free diet and have asked the patient to avoid sorbitol and fructose, and have given him printed information concerning these nonabsorbable carbohydrates. I'll see him back in 2 weeks' time. If he continues to have severe diarrhea, we will  consider Lotronex therapy.

## 2013-10-13 NOTE — Patient Instructions (Addendum)
Please follow up with Dr. Jarold Motto in two weeks  We have given you samples of the following medication to take: VLS #3 packet, please take on packet and mix in juice or applesauce to take once daily(PLEASE PUT IN REFRIGERATOR) Xifaxan 550 mg, please take one tablet by mouth twice daily for two weeks  Information on Lactose diet given today for your review  Information on Artificial Sweeteners given today for your review

## 2013-10-31 ENCOUNTER — Ambulatory Visit (INDEPENDENT_AMBULATORY_CARE_PROVIDER_SITE_OTHER): Payer: BC Managed Care – PPO | Admitting: Gastroenterology

## 2013-10-31 ENCOUNTER — Encounter: Payer: Self-pay | Admitting: Gastroenterology

## 2013-10-31 ENCOUNTER — Telehealth: Payer: Self-pay | Admitting: Gastroenterology

## 2013-10-31 VITALS — BP 132/80 | HR 85 | Ht 71.0 in | Wt 230.4 lb

## 2013-10-31 DIAGNOSIS — E538 Deficiency of other specified B group vitamins: Secondary | ICD-10-CM

## 2013-10-31 DIAGNOSIS — K589 Irritable bowel syndrome without diarrhea: Secondary | ICD-10-CM

## 2013-10-31 DIAGNOSIS — K6389 Other specified diseases of intestine: Secondary | ICD-10-CM

## 2013-10-31 MED ORDER — CILIDINIUM-CHLORDIAZEPOXIDE 2.5-5 MG PO CAPS: ORAL_CAPSULE | ORAL | Status: DC

## 2013-10-31 MED ORDER — VSL#3 PO CAPS: 1.0000 | ORAL_CAPSULE | Freq: Every day | ORAL | Status: DC

## 2013-10-31 NOTE — Progress Notes (Signed)
History of Present Illness: This is a -year-old Caucasian male who has IBS with rapid intestinal transit but also has suspected element of bacterial overgrowth syndrome.  He was recently treated with Xifaxan for two-week course with marked improvement in his diarrhea, gas and bloating.  He continues to have some urgency with eating fried greasy foods, and he uses when necessary Librax.  Part of his workup he was found to have B12 deficiency and is on B12 parenteral shots.  He also is been on probiotic therapy feels he is much improved.    Current Medications, Allergies, Past Medical History, Past Surgical History, Family History and Social History were reviewed in [employer] record.  ROS: All systems were reviewed and are negative unless otherwise stated in the HPI.         Assessment and plan: IBS with rapid intestinal transit, element of bacterial overgrowth syndrome, all better with above mentioned therapy.  I will give him 2 more weeks of probiotic therapy with VSL #3 and have urged him to use when necessary Librax for abdominal spasms and diarrhea.  His continue B12 shots monthly.  We will see him again on a when necessary basis as needed

## 2013-10-31 NOTE — Patient Instructions (Signed)
Refill on Librax was sent to your pharmacy   Samples of VSL # 3 Probiotic was given today, please take one capsule by mouth once daily  PLEASE KEEP IN REFRIGERATOR

## 2013-10-31 NOTE — Telephone Encounter (Signed)
RX faxed

## 2013-11-19 ENCOUNTER — Other Ambulatory Visit: Payer: Self-pay | Admitting: Family Medicine

## 2013-11-20 NOTE — Telephone Encounter (Signed)
Refill once OK. 

## 2013-11-27 ENCOUNTER — Ambulatory Visit (INDEPENDENT_AMBULATORY_CARE_PROVIDER_SITE_OTHER): Payer: BC Managed Care – PPO | Admitting: Family Medicine

## 2013-11-27 ENCOUNTER — Encounter: Payer: Self-pay | Admitting: Family Medicine

## 2013-11-27 VITALS — BP 120/80 | HR 86 | Wt 225.0 lb

## 2013-11-27 DIAGNOSIS — Z8659 Personal history of other mental and behavioral disorders: Secondary | ICD-10-CM

## 2013-11-27 DIAGNOSIS — I10 Essential (primary) hypertension: Secondary | ICD-10-CM

## 2013-11-27 DIAGNOSIS — E538 Deficiency of other specified B group vitamins: Secondary | ICD-10-CM | POA: Insufficient documentation

## 2013-11-27 DIAGNOSIS — K219 Gastro-esophageal reflux disease without esophagitis: Secondary | ICD-10-CM

## 2013-11-27 NOTE — Progress Notes (Signed)
Pre visit review using our clinic review tool, if applicable. No additional management support is needed unless otherwise documented below in the visit note. 

## 2013-11-27 NOTE — Progress Notes (Signed)
   Subjective:    Patient ID: Harold Robinson, male    DOB: [birth year], 42 y.o.   MRN: [id number]  HPI Patient here for medical follow up His chronic problems include history of hypertension, depression, GERD, recently diagnosed B12 deficiency, and some chronic neck and lumbar back pains. He's been followed closely by neurosurgery. He had epidural injections of the cervical spine last year which helped minimally. He said some recent neck pains after a hard sneeze. No radiculopathy symptoms. No numbness or weakness. Using Flexeril at night which helps.  Long history of GERD treated with Nexium. Recent B12 deficiency and has been on intramuscular replacement. Does feel somewhat better overall in terms of increased energy levels. He also has history of reported IBS. The symptoms are relatively stable.  Hypertension treated with metoprolol. Blood pressure stable. No dizziness. Depression treated with sertraline. Stable. He is not interested in tapering or discontinuing this time. He is also some chronic anxiety symptoms which is been improved with sertraline. Denies any side effects.  Past Medical History  Diagnosis Date  . Neck pain   . Pleurisy   . Arthritis     neck  . Hiatal hernia   . Esophagitis    Past Surgical History  Procedure Laterality Date  . Wisdom tooth extraction      reports that he has never smoked. He has never used smokeless tobacco. He reports that he does not drink alcohol or use illicit drugs. family history includes Heart disease in his father, paternal aunt, and paternal uncle; Hypertension in his mother. There is no history of Colon cancer, Esophageal cancer, Rectal cancer, or Stomach cancer. No Known Allergies    Review of Systems  Constitutional: Negative for fatigue and unexpected weight change.  Eyes: Negative for visual disturbance.  Respiratory: Negative for cough, chest tightness and shortness of breath.   Cardiovascular: Negative for chest pain,  palpitations and leg swelling.  Endocrine: Negative for polydipsia and polyuria.  Musculoskeletal: Positive for neck pain.  Neurological: Negative for dizziness, syncope, weakness, light-headedness and headaches.       Objective:   Physical Exam  Constitutional: He appears well-developed and well-nourished.  Neck: Neck supple.  Cardiovascular: Normal rate.   Pulmonary/Chest: Effort normal and breath sounds normal. No respiratory distress. He has no wheezes. He has no rales.  Musculoskeletal: He exhibits no edema.  Psychiatric: He has a normal mood and affect. His behavior is normal.          Assessment & Plan:  #1 hypertension. Stable at goal. Continue metoprolol #2 health maintenance. Flu vaccine offered and declined by patient #3 recently diagnosed B12 deficiency in a patient on chronic proton pump inhibitor. He is on intramuscular replacement. He does not have a scheduled followup with GI. We've written for repeat B12 level in about 2 months #4 depression which is stable on sertraline. Continue current medication. #5 GERD. Symptomatically stable on Nexium.

## 2014-01-29 ENCOUNTER — Other Ambulatory Visit (INDEPENDENT_AMBULATORY_CARE_PROVIDER_SITE_OTHER): Payer: BC Managed Care – PPO

## 2014-01-29 DIAGNOSIS — E538 Deficiency of other specified B group vitamins: Secondary | ICD-10-CM

## 2014-01-29 LAB — VITAMIN B12: Vitamin B-12: 247 pg/mL (ref 211–911)

## 2014-01-31 ENCOUNTER — Other Ambulatory Visit: Payer: Self-pay | Admitting: Family Medicine

## 2014-01-31 DIAGNOSIS — E538 Deficiency of other specified B group vitamins: Secondary | ICD-10-CM

## 2014-02-12 ENCOUNTER — Other Ambulatory Visit: Payer: Self-pay | Admitting: Gastroenterology

## 2014-05-28 ENCOUNTER — Ambulatory Visit (INDEPENDENT_AMBULATORY_CARE_PROVIDER_SITE_OTHER): Payer: BC Managed Care – PPO | Admitting: Family Medicine

## 2014-05-28 ENCOUNTER — Encounter: Payer: Self-pay | Admitting: Family Medicine

## 2014-05-28 VITALS — BP 124/82 | HR 70 | Temp 98.3°F | Wt 228.0 lb

## 2014-05-28 DIAGNOSIS — E538 Deficiency of other specified B group vitamins: Secondary | ICD-10-CM

## 2014-05-28 DIAGNOSIS — Z8659 Personal history of other mental and behavioral disorders: Secondary | ICD-10-CM

## 2014-05-28 DIAGNOSIS — K219 Gastro-esophageal reflux disease without esophagitis: Secondary | ICD-10-CM

## 2014-05-28 DIAGNOSIS — I10 Essential (primary) hypertension: Secondary | ICD-10-CM

## 2014-05-28 LAB — VITAMIN B12: VITAMIN B 12: 349 pg/mL (ref 211–911)

## 2014-05-28 MED ORDER — ALBUTEROL SULFATE HFA 108 (90 BASE) MCG/ACT IN AERS: 2.0000 | INHALATION_SPRAY | RESPIRATORY_TRACT | Status: DC | PRN

## 2014-05-28 NOTE — Progress Notes (Signed)
   Subjective:    Patient ID: Harold Robinson, male    DOB: May 19, 1971, 43 y.o.   MRN: [id number]  HPI Here for medical followup.  GERD which is well controlled. Takes daily Nexium. No dysphagia. He has hypertension treated with metoprolol. Not monitoring blood pressures. No headaches or dizziness.  History of recurrent depression. Stable on sertraline 75 mg daily. Denies any side effects. He has history of B12 deficiency. He is currently taking oral replacement. We plan to get some followup labs to recheck levels.  Has ongoing cervical neck pains. Followed by orthopedist. He tried injections which did not help much. Denies any current weakness. Pain is somewhat intermittent  Past Medical History  Diagnosis Date  . Neck pain   . Pleurisy   . Arthritis     neck  . Hiatal hernia   . Esophagitis    Past Surgical History  Procedure Laterality Date  . Wisdom tooth extraction      reports that he has never smoked. He has never used smokeless tobacco. He reports that he does not drink alcohol or use illicit drugs. family history includes Heart disease in his father, paternal aunt, and paternal uncle; Hypertension in his mother. There is no history of Colon cancer, Esophageal cancer, Rectal cancer, or Stomach cancer. No Known Allergies    Review of Systems  Constitutional: Positive for appetite change. Negative for fatigue and unexpected weight change.  Eyes: Negative for visual disturbance.  Respiratory: Negative for cough, chest tightness and shortness of breath.   Cardiovascular: Negative for chest pain, palpitations and leg swelling.  Endocrine: Negative for polydipsia and polyuria.  Neurological: Negative for dizziness, syncope, weakness, light-headedness and headaches.       Objective:   Physical Exam  Constitutional: He appears well-developed and well-nourished.  Neck: Neck supple. No thyromegaly present.  Cardiovascular: Normal rate and regular rhythm.  Exam reveals no  gallop.   No murmur heard. Pulmonary/Chest: Effort normal and breath sounds normal. No respiratory distress. He has no wheezes. He has no rales.  Musculoskeletal: He exhibits no edema.          Assessment & Plan:  #1 hypertension. Stable and at goal. Continue metoprolol #2 history of recurrent depression. Stable. Continue sertraline #3 history of B12 deficiency. Recheck B12 level. Continue oral replacement unless levels remain low #4 history of GERD which is stable on Nexium

## 2014-05-28 NOTE — Progress Notes (Signed)
Pre visit review using our clinic review tool, if applicable. No additional management support is needed unless otherwise documented below in the visit note. 

## 2014-05-29 ENCOUNTER — Telehealth: Payer: Self-pay | Admitting: Family Medicine

## 2014-05-29 NOTE — Telephone Encounter (Signed)
Relevant patient education mailed to patient.  

## 2014-06-10 ENCOUNTER — Other Ambulatory Visit: Payer: Self-pay | Admitting: Family Medicine

## 2014-09-11 ENCOUNTER — Other Ambulatory Visit: Payer: Self-pay | Admitting: Family Medicine

## 2015-05-17 ENCOUNTER — Ambulatory Visit (INDEPENDENT_AMBULATORY_CARE_PROVIDER_SITE_OTHER): Payer: BC Managed Care – PPO | Admitting: Family Medicine

## 2015-05-17 ENCOUNTER — Encounter: Payer: Self-pay | Admitting: Family Medicine

## 2015-05-17 VITALS — BP 122/76 | HR 68 | Temp 98.4°F | Wt 226.0 lb

## 2015-05-17 DIAGNOSIS — I10 Essential (primary) hypertension: Secondary | ICD-10-CM | POA: Diagnosis not present

## 2015-05-17 NOTE — Progress Notes (Signed)
   Subjective:    Patient ID: Harold Robinson, male    DOB: 07/15/1971, 44 y.o.   MRN: [id number]  HPI  patient here with concerns for elevated blood pressure. Wednesday he was late taking his Toprol-XL 50 mg. Later that morning he took blood pressure 160/118. He then took his pill later in the morning and by that evening his blood pressure was back down to normal. He has not had any headaches. No dizziness. No chest pains. Otherwise compliant with medication.  Past Medical History  Diagnosis Date  . Neck pain   . Pleurisy   . Arthritis     neck  . Hiatal hernia   . Esophagitis    Past Surgical History  Procedure Laterality Date  . Wisdom tooth extraction      reports that he has never smoked. He has never used smokeless tobacco. He reports that he does not drink alcohol or use illicit drugs. family history includes Heart disease in his father, paternal aunt, and paternal uncle; Hypertension in his mother. There is no history of Colon cancer, Esophageal cancer, Rectal cancer, or Stomach cancer. No Known Allergies    Review of Systems  Constitutional: Negative for fatigue.  Eyes: Negative for visual disturbance.  Respiratory: Negative for cough, chest tightness and shortness of breath.   Cardiovascular: Negative for chest pain, palpitations and leg swelling.  Neurological: Negative for dizziness, syncope, weakness, light-headedness and headaches.       Objective:   Physical Exam  Constitutional: He is oriented to person, place, and time. He appears well-developed and well-nourished.  HENT:  Right Ear: External ear normal.  Left Ear: External ear normal.  Mouth/Throat: Oropharynx is clear and moist.  Eyes: Pupils are equal, round, and reactive to light.  Neck: Neck supple. No thyromegaly present.  Cardiovascular: Normal rate and regular rhythm.   Pulmonary/Chest: Effort normal and breath sounds normal. No respiratory distress. He has no wheezes. He has no rales.    Musculoskeletal: He exhibits no edema.  Neurological: He is alert and oriented to person, place, and time.          Assessment & Plan:   Hypertension. Controlled by today's reading. Recent transient elevation likely related to not taking his blood pressure medications on time. Stressed consistency with timing of medication. Follow-up as needed

## 2015-05-17 NOTE — Patient Instructions (Signed)
Monitor blood pressure and be in touch if consistently > 140/90.   

## 2015-05-17 NOTE — Progress Notes (Signed)
Pre visit review using our clinic review tool, if applicable. No additional management support is needed unless otherwise documented below in the visit note. 

## 2015-06-01 ENCOUNTER — Other Ambulatory Visit: Payer: Self-pay | Admitting: Family Medicine

## 2015-06-28 ENCOUNTER — Other Ambulatory Visit: Payer: Self-pay | Admitting: Family Medicine

## 2015-11-22 ENCOUNTER — Ambulatory Visit: Payer: BC Managed Care – PPO | Admitting: Family Medicine

## 2015-11-22 DIAGNOSIS — Z0289 Encounter for other administrative examinations: Secondary | ICD-10-CM

## 2016-01-25 ENCOUNTER — Other Ambulatory Visit: Payer: Self-pay | Admitting: Family Medicine

## 2016-06-24 ENCOUNTER — Other Ambulatory Visit: Payer: Self-pay | Admitting: Family Medicine

## 2016-07-22 ENCOUNTER — Other Ambulatory Visit: Payer: Self-pay | Admitting: Family Medicine

## 2016-09-13 ENCOUNTER — Other Ambulatory Visit: Payer: Self-pay | Admitting: Family Medicine

## 2016-11-25 ENCOUNTER — Other Ambulatory Visit: Payer: Self-pay | Admitting: Family Medicine

## 2016-12-05 ENCOUNTER — Encounter (HOSPITAL_COMMUNITY): Payer: Self-pay

## 2016-12-05 ENCOUNTER — Emergency Department (HOSPITAL_COMMUNITY): Payer: BC Managed Care – PPO

## 2016-12-05 ENCOUNTER — Emergency Department (HOSPITAL_COMMUNITY)
Admission: EM | Admit: 2016-12-05 | Discharge: 2016-12-05 | Disposition: A | Discharge status: Home / Self Care | Payer: BC Managed Care – PPO | Attending: Emergency Medicine | Admitting: Emergency Medicine

## 2016-12-05 DIAGNOSIS — I10 Essential (primary) hypertension: Secondary | ICD-10-CM | POA: Insufficient documentation

## 2016-12-05 DIAGNOSIS — S82142A Displaced bicondylar fracture of left tibia, initial encounter for closed fracture: Secondary | ICD-10-CM | POA: Diagnosis not present

## 2016-12-05 DIAGNOSIS — Z79899 Other long term (current) drug therapy: Secondary | ICD-10-CM | POA: Diagnosis not present

## 2016-12-05 DIAGNOSIS — Y990 Civilian activity done for income or pay: Secondary | ICD-10-CM | POA: Diagnosis not present

## 2016-12-05 DIAGNOSIS — Y939 Activity, unspecified: Secondary | ICD-10-CM | POA: Insufficient documentation

## 2016-12-05 DIAGNOSIS — Y929 Unspecified place or not applicable: Secondary | ICD-10-CM | POA: Diagnosis not present

## 2016-12-05 DIAGNOSIS — S82132A Displaced fracture of medial condyle of left tibia, initial encounter for closed fracture: Secondary | ICD-10-CM

## 2016-12-05 DIAGNOSIS — W208XXA Other cause of strike by thrown, projected or falling object, initial encounter: Secondary | ICD-10-CM | POA: Diagnosis not present

## 2016-12-05 DIAGNOSIS — S8992XA Unspecified injury of left lower leg, initial encounter: Secondary | ICD-10-CM | POA: Diagnosis present

## 2016-12-05 LAB — CBC WITH DIFFERENTIAL/PLATELET
BASOS ABS: 0 10*3/uL (ref 0.0–0.1)
Basophils Relative: 0 %
EOS PCT: 2 %
Eosinophils Absolute: 0.2 10*3/uL (ref 0.0–0.7)
HEMATOCRIT: 44.5 % (ref 39.0–52.0)
Hemoglobin: 15.5 g/dL (ref 13.0–17.0)
LYMPHS ABS: 1.7 10*3/uL (ref 0.7–4.0)
LYMPHS PCT: 21 %
MCH: 29.8 pg (ref 26.0–34.0)
MCHC: 34.8 g/dL (ref 30.0–36.0)
MCV: 85.6 fL (ref 78.0–100.0)
MONO ABS: 0.6 10*3/uL (ref 0.1–1.0)
Monocytes Relative: 7 %
Neutro Abs: 5.6 10*3/uL (ref 1.7–7.7)
Neutrophils Relative %: 70 %
PLATELETS: 224 10*3/uL (ref 150–400)
RBC: 5.2 MIL/uL (ref 4.22–5.81)
RDW: 13.4 % (ref 11.5–15.5)
WBC: 8 10*3/uL (ref 4.0–10.5)

## 2016-12-05 LAB — I-STAT CHEM 8, ED
BUN: 13 mg/dL (ref 6–20)
CHLORIDE: 101 mmol/L (ref 101–111)
CREATININE: 1.2 mg/dL (ref 0.61–1.24)
Calcium, Ion: 1.11 mmol/L — ABNORMAL LOW (ref 1.15–1.40)
GLUCOSE: 91 mg/dL (ref 65–99)
HEMATOCRIT: 45 % (ref 39.0–52.0)
Hemoglobin: 15.3 g/dL (ref 13.0–17.0)
POTASSIUM: 3.6 mmol/L (ref 3.5–5.1)
Sodium: 139 mmol/L (ref 135–145)
TCO2: 24 mmol/L (ref 0–100)

## 2016-12-05 LAB — BASIC METABOLIC PANEL
ANION GAP: 11 (ref 5–15)
BUN: 12 mg/dL (ref 6–20)
CO2: 24 mmol/L (ref 22–32)
Calcium: 9.5 mg/dL (ref 8.9–10.3)
Chloride: 103 mmol/L (ref 101–111)
Creatinine, Ser: 1.23 mg/dL (ref 0.61–1.24)
GFR calc Af Amer: >60 mL/min (ref >60)
GLUCOSE: 87 mg/dL (ref 65–99)
POTASSIUM: 3.7 mmol/L (ref 3.5–5.1)
Sodium: 138 mmol/L (ref 135–145)

## 2016-12-05 MED ORDER — MORPHINE SULFATE (PF) 4 MG/ML IV SOLN
4.0000 mg | Freq: Once | INTRAVENOUS | Status: AC
  Administered 2016-12-05: 4 mg via INTRAVENOUS
  Filled 2016-12-05: qty 1

## 2016-12-05 MED ORDER — ONDANSETRON HCL 4 MG/2ML IJ SOLN
4.0000 mg | Freq: Once | INTRAMUSCULAR | Status: AC
  Administered 2016-12-05: 4 mg via INTRAVENOUS
  Filled 2016-12-05: qty 2

## 2016-12-05 MED ORDER — METHOCARBAMOL 500 MG PO TABS: 500.0000 mg | ORAL_TABLET | Freq: Four times a day (QID) | ORAL | 0 refills | Status: DC | PRN

## 2016-12-05 MED ORDER — OXYCODONE-ACETAMINOPHEN 5-325 MG PO TABS: 1.0000 | ORAL_TABLET | ORAL | 0 refills | Status: DC | PRN

## 2016-12-05 MED ORDER — HYDROMORPHONE HCL 2 MG/ML IJ SOLN
0.5000 mg | Freq: Once | INTRAMUSCULAR | Status: AC
  Administered 2016-12-05: 0.5 mg via INTRAVENOUS
  Filled 2016-12-05: qty 1

## 2016-12-05 NOTE — ED Notes (Signed)
Pt stable, understands discharge instructions, and reasons for return.   

## 2016-12-05 NOTE — ED Provider Notes (Signed)
[facility] DEPT Provider Note   CSN: [id number] Arrival date & time: 12/05/16  1503     History   Chief Complaint Chief Complaint  Patient presents with  . Leg Injury    HPI   Harold Robinson is a 45 y.o. male complaining of  Severe left knee pain after patient was working on a hay bale her the equipment was approximately 3000 pounds it moved spontaneously and hit him on the back of the head and the back and hand his leg in the metal. Pain is severe, he denies loss of consciousness, anticoagulation, cervicalgia, chest pain, shortness of breath (he states he was initially short of breath but this has resolved) abdominal pain. Endorses numbness to the left toes.  Past Medical History:  Diagnosis Date  . Arthritis    neck  . Esophagitis   . Hiatal hernia   . Neck pain   . Pleurisy     Patient Active Problem List   Diagnosis Date Noted  . Tibial plateau fracture, left, closed, initial encounter 12/05/2016  . Vitamin B 12 deficiency 11/27/2013  . History of depression 05/26/2013  . COSTOCHONDRITIS 05/26/2010  . GERD 08/20/2009  . BACK PAIN, LUMBAR 08/20/2009  . Essential hypertension 06/04/2009  . CHEST PAIN 06/04/2009    Past Surgical History:  Procedure Laterality Date  . WISDOM TOOTH EXTRACTION        Home Medications    Prior to Admission medications   Medication Sig Start Date End Date Taking? Authorizing Provider  albuterol (PROVENTIL HFA;VENTOLIN HFA) 108 (90 BASE) MCG/ACT inhaler Inhale 2 puffs into the lungs every 4 (four) hours as needed for wheezing. 05/28/14   [name], MD  clidinium-chlordiazePOXIDE (LIBRAX) 5-2.5 MG per capsule Take one capsule by mouth every 6-8 hours for abdominal cramping 10/31/13   Sable Feil, MD  cyanocobalamin (,VITAMIN B-12,) 1000 MCG/ML injection Inject 1 mL (1,000 mcg total) into the muscle once. Patient not taking: Reported on 05/17/2015 10/13/13   Sable Feil, MD  cyclobenzaprine (FLEXERIL) 10 MG  tablet TAKE 1 TABLET BY MOUTH EVERY 8 HOURS AS NEEDED FO RMUSCLE SPASMS 03/16/13   Laurey Morale, MD  esomeprazole (NEXIUM) 40 MG capsule Take 40 mg by mouth. Take one capsule by mouth once daily 09/13/13   Sable Feil, MD  methocarbamol (ROBAXIN) 500 MG tablet Take 1 tablet (500 mg total) by mouth every 6 (six) hours as needed for muscle spasms. 12/05/16   Mcarthur Rossetti, MD  metoprolol succinate (TOPROL-XL) 50 MG 24 hr tablet TAKE 1 TABLET (50 MG TOTAL) BY MOUTH DAILY. 07/28/13   [name], MD  metoprolol succinate (TOPROL-XL) 50 MG 24 hr tablet TAKE 1 TABLET (50 MG TOTAL) BY MOUTH DAILY. 07/23/16   [name], MD  oxyCODONE-acetaminophen (ROXICET) 5-325 MG tablet Take 1-2 tablets by mouth every 4 (four) hours as needed. 12/05/16   Mcarthur Rossetti, MD  Probiotic Product (VSL#3) CAPS Take 1 capsule by mouth daily. Patient not taking: Reported on 05/17/2015 10/31/13   Sable Feil, MD  sertraline (ZOLOFT) 50 MG tablet TAKE 1 & 1/2 TABLETS BY MOUTH DAILY 09/14/16   [name], MD    Family History Family History  Problem Relation Age of Onset  . Heart disease Father   . Heart disease Paternal Aunt   . Heart disease Paternal Uncle   . Hypertension Mother   . Colon cancer Neg Hx   . Esophageal cancer Neg Hx   .  Rectal cancer Neg Hx   . Stomach cancer Neg Hx     Social History Social History  Substance Use Topics  . Smoking status: Never Smoker  . Smokeless tobacco: Never Used  . Alcohol use No     Allergies   Patient has no known allergies.   Review of Systems Review of Systems  10 systems reviewed and found to be negative, except as noted in the HPI.   Physical Exam Updated Vital Signs BP 127/69   Pulse 77   Temp 98.6 F (37 C) (Oral)   Resp 16   Ht 5\' 11"  (1.803 m)   Wt 104.3 kg   SpO2 95%   BMI 32.08 kg/m   Physical Exam  Constitutional: He is oriented to person, place, and time. He appears well-developed and  well-nourished. No distress.  HENT:  Head: Normocephalic and atraumatic.  Mouth/Throat: Oropharynx is clear and moist.  Eyes: Conjunctivae and EOM are normal. Pupils are equal, round, and reactive to light.  Neck: Normal range of motion.  Cardiovascular: Normal rate, regular rhythm and intact distal pulses.   Pulmonary/Chest: Effort normal and breath sounds normal.  Abdominal: Soft. There is no tenderness.  Musculoskeletal:  Right leg with no deformity, DP and PT pulses are 2+ good range of motion to toes, neurovascularly intact at the foot. No tenderness palpation along the foot, ankle. Patella is intact. Range of motion of the knee is not tested. No tenderness to palpation along the femur or hip. No lacerations, ecchymosis or abrasions. Compartments are soft.  Neurological: He is alert and oriented to person, place, and time.  Skin: He is not diaphoretic.  Psychiatric: He has a normal mood and affect.  Nursing note and vitals reviewed.    ED Treatments / Results  Labs (all labs ordered are listed, but only abnormal results are displayed) Labs Reviewed  I-STAT CHEM 8, ED - Abnormal; Notable for the following:       Result Value   Calcium, [name] 1.11 (*)    All other components within normal limits  CBC WITH DIFFERENTIAL/PLATELET  BASIC METABOLIC PANEL    EKG  EKG Interpretation None       Radiology Dg Ankle Complete Left  Result Date: 12/05/2016 CLINICAL DATA:  Patient status post heavy machine falling on him with left ankle pain. EXAM: LEFT ANKLE COMPLETE - 3+ VIEW COMPARISON:  None. FINDINGS: There is radiolucency projected over the metatarsals on the lateral view only. There is no acute fracture dislocation within the left ankle. IMPRESSION: Linear radiolucency projected over the metatarsals on the lateral view only. Further evaluation with left foot film is recommended. Electronically Signed   By: Abelardo Diesel M.D.   On: 12/05/2016 16:56   Ct Knee Left Wo  Contrast  Result Date: 12/05/2016 CLINICAL DATA:  Hay loader fell on patient.  Left knee pain. EXAM: CT OF THE  KNEE WITHOUT CONTRAST TECHNIQUE: Multidetector CT imaging of the knee was performed according to the standard protocol. Multiplanar CT image reconstructions were also generated. COMPARISON:  None. FINDINGS: Bones/Joint/Cartilage Acute, closed, lateral tibial plateau fracture along the anterior half with 10 mm of depression noted along the posterior central aspect of the fracture. Fracture at the base of the medial tibial spine with minimal comminution. Suprapatellar lipohemarthrosis. Ligaments Suboptimally assessed by CT. ACL appears somewhat thickened but appears to maintain its orientation. ACL sprain not entirely excluded. PCL appears intact. MCL appears intact. Mild thickening and edema along the course of the LCL. Muscles  and Tendons No intramuscular hematoma.  Intact extensor mechanism. Soft tissues Periarticular soft tissue swelling anteriorly. IMPRESSION: Acute, lateral tibial plateau fracture with up to 10 mm of depression noted centrally and posteriorly. Associated lipohemarthrosis. Ligaments are suboptimally assessed by CT. The ACL and LCL appear somewhat thickened and sprains are not entirely excluded. Electronically Signed   By: Ashley Royalty M.D.   On: 12/05/2016 18:21   Dg Knee Complete 4 Views Left  Result Date: 12/05/2016 CLINICAL DATA:  Patient's working on a heavy machine with the machine falling on his hip knee and ankle today. EXAM: LEFT KNEE - COMPLETE 4+ VIEW COMPARISON:  None. FINDINGS: There is intra-articular displaced fracture of the lateral tibial plateau. Fluid fluid level is identified within the suprapatellar space. IMPRESSION: Fracture of proximal tibia. Electronically Signed   By: Abelardo Diesel M.D.   On: 12/05/2016 16:53   Dg Foot Complete Left  Result Date: 12/05/2016 CLINICAL DATA:  Left foot pain after injury. EXAM: LEFT FOOT - COMPLETE 3+ VIEW COMPARISON:   None. FINDINGS: There is no evidence of fracture or dislocation. There is no evidence of arthropathy or other focal bone abnormality. Soft tissues are unremarkable. IMPRESSION: Normal left foot. Electronically Signed   By: Marijo Conception, M.D.   On: 12/05/2016 18:16   Dg Hip Unilat W Or Wo Pelvis 2-3 Views Left  Result Date: 12/05/2016 CLINICAL DATA:  Patient was working on heavy machinery today with the machine falling on hand complaining of left hip, knee and ankle pain. EXAM: DG HIP (WITH OR WITHOUT PELVIS) 2-3V LEFT COMPARISON:  None. FINDINGS: There is no evidence of hip fracture or dislocation. There is no evidence of arthropathy or other focal bone abnormality. IMPRESSION: Negative. Electronically Signed   By: Abelardo Diesel M.D.   On: 12/05/2016 16:53    Procedures Procedures (including critical care time)  Medications Ordered in ED Medications  HYDROmorphone (DILAUDID) injection 0.5 mg (0.5 mg Intravenous Given 12/05/16 1523)  ondansetron (ZOFRAN) injection 4 mg (4 mg Intravenous Given 12/05/16 1523)  morphine 4 MG/ML injection 4 mg (4 mg Intravenous Given 12/05/16 1648)  morphine 4 MG/ML injection 4 mg (4 mg Intravenous Given 12/05/16 1918)     Initial Impression / Assessment and Plan / ED Course  I have reviewed the triage vital signs and the nursing notes.  Pertinent labs & imaging results that were available during my care of the patient were reviewed by me and considered in my medical decision making (see chart for details).  Clinical Course     Vitals:   12/05/16 1715 12/05/16 1845 12/05/16 1900 12/05/16 1915  BP: 130/80  143/92 127/69  Pulse: 70 77    Resp: 15 (!) 29 19 16   Temp:      TempSrc:      SpO2: 92% 95%    Weight:      Height:        Medications  HYDROmorphone (DILAUDID) injection 0.5 mg (0.5 mg Intravenous Given 12/05/16 1523)  ondansetron (ZOFRAN) injection 4 mg (4 mg Intravenous Given 12/05/16 1523)  morphine 4 MG/ML injection 4 mg (4 mg  Intravenous Given 12/05/16 1648)  morphine 4 MG/ML injection 4 mg (4 mg Intravenous Given 12/05/16 1918)    ZABDI KASAL is 45 y.o. male presenting with Severe left lower extremity pain after it was crushed in a large mechanical farming equipment earlier in the day. He is nonweightbearing. There is no gross deformity, neurovascular intact with soft compartments and good distal pulses.  X-ray shows a lateral tibial plateau fracture. Orthopedic consult from Dr. Ninfa Linden appreciated: Recommends obtaining noncontrast CT to further evaluate the fracture.  Dr. Ninfa Linden has been into evaluate the patient, no emergent surgery is indicated. He is given a prescription for pain medication and will call to arrange outpatient surgery.     Final Clinical Impressions(s) / ED Diagnoses   Final diagnoses:  Closed fracture of medial portion of left tibial plateau, initial encounter    New Prescriptions Current Discharge Medication List    START taking these medications   Details  methocarbamol (ROBAXIN) 500 MG tablet Take 1 tablet (500 mg total) by mouth every 6 (six) hours as needed for muscle spasms. Qty: 60 tablet, Refills: 0    oxyCODONE-acetaminophen (ROXICET) 5-325 MG tablet Take 1-2 tablets by mouth every 4 (four) hours as needed. Qty: 60 tablet, Refills: 0         Monico Blitz, PA-C 12/05/16 1943    Blanchie Dessert, MD 12/07/16 [phone]

## 2016-12-05 NOTE — ED Notes (Signed)
Patient transported to X-ray 

## 2016-12-05 NOTE — Discharge Instructions (Addendum)
No weight on your left leg at all. Ice and elevation as needed for swelling. You will be called about the timing of surgery.

## 2016-12-05 NOTE — ED Notes (Signed)
Per Ortho surgery. Ortho tech called to place knee immobilizer and crutches for pt.

## 2016-12-05 NOTE — Consult Note (Signed)
Reason for Consult:  Left tibial plateau fracture Referring Physician:  Monico Blitz, PA  Harold Robinson is an 45 y.o. male.  HPI:   45 yo male who injured his left knee after an accident this afternoon with a hay elevator.  After severe left knee pain and the inability to ambulate, he was brought to [facility] ED with left knee pain and swelling.  He was found to have a left tibial plateau fracture.  He complains on severe left knee pain, but does appear comfortable.  He denies other injuries.  He denies left foot numbness.  I have independently reviewed his left knee x-rays and CT scan.  Past Medical History:  Diagnosis Date  . Arthritis    neck  . Esophagitis   . Hiatal hernia   . Neck pain   . Pleurisy     Past Surgical History:  Procedure Laterality Date  . WISDOM TOOTH EXTRACTION      Family History  Problem Relation Age of Onset  . Heart disease Father   . Heart disease Paternal Aunt   . Heart disease Paternal Uncle   . Hypertension Mother   . Colon cancer Neg Hx   . Esophageal cancer Neg Hx   . Rectal cancer Neg Hx   . Stomach cancer Neg Hx     Social History:  reports that he has never smoked. He has never used smokeless tobacco. He reports that he does not drink alcohol or use drugs.  Allergies: No Known Allergies  Medications: I have reviewed the patient's current medications.  Results for orders placed or performed during the hospital encounter of 12/05/16 (from the past 48 hour(s))  I-Stat Chem 8, ED     Status: Abnormal   Collection Time: 12/05/16  5:02 PM  Result Value Ref Range   Sodium 139 135 - 145 mmol/L   Potassium 3.6 3.5 - 5.1 mmol/L   Chloride 101 101 - 111 mmol/L   BUN 13 6 - 20 mg/dL   Creatinine, Ser 1.20 0.61 - 1.24 mg/dL   Glucose, Bld 91 65 - 99 mg/dL   Calcium, [name] 1.11 (L) 1.15 - 1.40 mmol/L   TCO2 24 0 - 100 mmol/L   Hemoglobin 15.3 13.0 - 17.0 g/dL   HCT 45.0 39.0 - 52.0 %  CBC with Differential     Status: None   Collection  Time: 12/05/16  5:13 PM  Result Value Ref Range   WBC 8.0 4.0 - 10.5 K/uL   RBC 5.20 4.22 - 5.81 MIL/uL   Hemoglobin 15.5 13.0 - 17.0 g/dL   HCT 44.5 39.0 - 52.0 %   MCV 85.6 78.0 - 100.0 fL   MCH 29.8 26.0 - 34.0 pg   MCHC 34.8 30.0 - 36.0 g/dL   RDW 13.4 11.5 - 15.5 %   Platelets 224 150 - 400 K/uL   Neutrophils Relative % 70 %   Neutro Abs 5.6 1.7 - 7.7 K/uL   Lymphocytes Relative 21 %   Lymphs Abs 1.7 0.7 - 4.0 K/uL   Monocytes Relative 7 %   Monocytes Absolute 0.6 0.1 - 1.0 K/uL   Eosinophils Relative 2 %   Eosinophils Absolute 0.2 0.0 - 0.7 K/uL   Basophils Relative 0 %   Basophils Absolute 0.0 0.0 - 0.1 K/uL  Basic metabolic panel     Status: None   Collection Time: 12/05/16  5:13 PM  Result Value Ref Range   Sodium 138 135 - 145 mmol/L  Potassium 3.7 3.5 - 5.1 mmol/L   Chloride 103 101 - 111 mmol/L   CO2 24 22 - 32 mmol/L   Glucose, Bld 87 65 - 99 mg/dL   BUN 12 6 - 20 mg/dL   Creatinine, Ser 1.23 0.61 - 1.24 mg/dL   Calcium 9.5 8.9 - 10.3 mg/dL   GFR calc non Af Amer >60 >60 mL/min   GFR calc Af Amer >60 >60 mL/min    Comment: (NOTE) The eGFR has been calculated using the CKD EPI equation. This calculation has not been validated in all clinical situations. eGFR's persistently <60 mL/min signify possible Chronic Kidney Disease.    Anion gap 11 5 - 15    Dg Ankle Complete Left  Result Date: 12/05/2016 CLINICAL DATA:  Patient status post heavy machine falling on him with left ankle pain. EXAM: LEFT ANKLE COMPLETE - 3+ VIEW COMPARISON:  None. FINDINGS: There is radiolucency projected over the metatarsals on the lateral view only. There is no acute fracture dislocation within the left ankle. IMPRESSION: Linear radiolucency projected over the metatarsals on the lateral view only. Further evaluation with left foot film is recommended. Electronically Signed   By: Abelardo Diesel M.D.   On: 12/05/2016 16:56   Ct Knee Left Wo Contrast  Result Date: 12/05/2016 CLINICAL  DATA:  Hay loader fell on patient.  Left knee pain. EXAM: CT OF THE  KNEE WITHOUT CONTRAST TECHNIQUE: Multidetector CT imaging of the knee was performed according to the standard protocol. Multiplanar CT image reconstructions were also generated. COMPARISON:  None. FINDINGS: Bones/Joint/Cartilage Acute, closed, lateral tibial plateau fracture along the anterior half with 10 mm of depression noted along the posterior central aspect of the fracture. Fracture at the base of the medial tibial spine with minimal comminution. Suprapatellar lipohemarthrosis. Ligaments Suboptimally assessed by CT. ACL appears somewhat thickened but appears to maintain its orientation. ACL sprain not entirely excluded. PCL appears intact. MCL appears intact. Mild thickening and edema along the course of the LCL. Muscles and Tendons No intramuscular hematoma.  Intact extensor mechanism. Soft tissues Periarticular soft tissue swelling anteriorly. IMPRESSION: Acute, lateral tibial plateau fracture with up to 10 mm of depression noted centrally and posteriorly. Associated lipohemarthrosis. Ligaments are suboptimally assessed by CT. The ACL and LCL appear somewhat thickened and sprains are not entirely excluded. Electronically Signed   By: Ashley Royalty M.D.   On: 12/05/2016 18:21   Dg Knee Complete 4 Views Left  Result Date: 12/05/2016 CLINICAL DATA:  Patient's working on a heavy machine with the machine falling on his hip knee and ankle today. EXAM: LEFT KNEE - COMPLETE 4+ VIEW COMPARISON:  None. FINDINGS: There is intra-articular displaced fracture of the lateral tibial plateau. Fluid fluid level is identified within the suprapatellar space. IMPRESSION: Fracture of proximal tibia. Electronically Signed   By: Abelardo Diesel M.D.   On: 12/05/2016 16:53   Dg Foot Complete Left  Result Date: 12/05/2016 CLINICAL DATA:  Left foot pain after injury. EXAM: LEFT FOOT - COMPLETE 3+ VIEW COMPARISON:  None. FINDINGS: There is no evidence of  fracture or dislocation. There is no evidence of arthropathy or other focal bone abnormality. Soft tissues are unremarkable. IMPRESSION: Normal left foot. Electronically Signed   By: Marijo Conception, M.D.   On: 12/05/2016 18:16   Dg Hip Unilat W Or Wo Pelvis 2-3 Views Left  Result Date: 12/05/2016 CLINICAL DATA:  Patient was working on heavy machinery today with the machine falling on hand complaining of left  hip, knee and ankle pain. EXAM: DG HIP (WITH OR WITHOUT PELVIS) 2-3V LEFT COMPARISON:  None. FINDINGS: There is no evidence of hip fracture or dislocation. There is no evidence of arthropathy or other focal bone abnormality. IMPRESSION: Negative. Electronically Signed   By: Abelardo Diesel M.D.   On: 12/05/2016 16:53    Review of Systems  Musculoskeletal: Positive for joint pain.  All other systems reviewed and are negative.  Blood pressure 130/80, pulse 70, temperature 98.6 F (37 C), temperature source Oral, resp. rate 15, height '5\' 11"'$  (1.803 m), weight 230 lb (104.3 kg), SpO2 92 %. Physical Exam  Constitutional: He is oriented to person, place, and time. He appears well-developed and well-nourished.  HENT:  Head: Normocephalic and atraumatic.  Eyes: EOM are normal. Pupils are equal, round, and reactive to light.  Neck: Normal range of motion. Neck supple.  Cardiovascular: Normal rate and regular rhythm.   Respiratory: Effort normal and breath sounds normal.  GI: Soft. Bowel sounds are normal.  Musculoskeletal:       Left knee: He exhibits decreased range of motion, swelling, effusion and bony tenderness. Tenderness found. Lateral joint line tenderness noted.  Neurological: He is alert and oriented to person, place, and time.  Skin: Skin is warm and dry.  Psychiatric: He has a normal mood and affect.   On exam, he shows no evidence of compartment syndrome.  His calf is soft and his foot has strong pulses.  He has normal sensation over his foot.   Assessment/Plan: Left knee with  displaced tibial plateau fracture - lateral depression 1)  I spoke to the patient and his wife at the bedside.  He will eventually need surgery on his left proximal tibia fracture once the soft-tissue swelling has subsided.  For no, he will need a knee immobilizer and crutches with strict elevation, NWB, and ice.  He will take a full-strength aspirin daily.  He has our number to call if things worsen, but does understand we will call next week with surgery dates.  He is given meds for pain.  A detailed discussion of surgery was had as well as the risks and benefits involved and we went over hi x-rays in detail.  Mcarthur Rossetti 12/05/2016, 6:50 PM

## 2016-12-05 NOTE — ED Triage Notes (Signed)
Pt brought with left leg injury. Pt states hay elevator landed collapsed on pts left leg. Pt reports feeling pain throughout whole leg. No lacerations of bruising noted. Pt unable lift leg. Pt able to wiggle toes with strong pedal pulse.

## 2016-12-08 NOTE — Patient Instructions (Addendum)
Harold Robinson  12/08/2016   Your procedure is scheduled on: FRIDAY 12/11/2016  Report to [facility] Main  Entrance take [facility]  elevators to 3rd floor to  [place] at    305  PM.  Call this number if you have problems the morning of surgery [phone]   Remember: ONLY 1 PERSON MAY GO WITH YOU TO SHORT STAY TO GET  READY MORNING OF [place].    Do not eat food  :After Midnight. May have clear liquids from midnight up until 1005 am then nothing until after surgery!     CLEAR LIQUID DIET   Foods Allowed                                                                     Foods Excluded  Coffee and tea, regular and decaf                             liquids that you cannot  Plain Jell-O in any flavor                                             see through such as: Fruit ices (not with fruit pulp)                                     milk, soups, orange juice  Iced Popsicles                                    All solid food Carbonated beverages, regular and diet                                    Cranberry, grape and apple juices Sports drinks like Gatorade Lightly seasoned clear broth or consume(fat free) Sugar, honey syrup  Sample Menu Breakfast                                Lunch                                     Supper Cranberry juice                    Beef broth                            Chicken broth Jell-O                                     Grape juice  Apple juice Coffee or tea                        Jell-O                                      Popsicle                                                Coffee or tea                        Coffee or tea  _____________________________________________________________________     Take these medicines the morning of surgery with A SIP OF WATER: use Albuterol inhaler if needed, Sertraline (Zoloft), Oxycodone if needed                                  You may not  have any metal on your body including hair pins and              piercings  Do not wear jewelry, make-up, lotions, powders or perfumes, deodorant             Do not wear nail polish.  Do not shave  48 hours prior to surgery.              Men may shave face and neck.   Do not bring valuables to the hospital. [place].  Contacts, dentures or bridgework may not be worn into surgery.  Leave suitcase in the car. After surgery it may be brought to your room.                   Please read over the following fact sheets you were given: _____________________________________________________________________             [facility] - Preparing for Surgery Before surgery, you can play an important role.  Because skin is not sterile, your skin needs to be as free of germs as possible.  You can reduce the number of germs on your skin by washing with CHG (chlorahexidine gluconate) soap before surgery.  CHG is an antiseptic cleaner which kills germs and bonds with the skin to continue killing germs even after washing. Please DO NOT use if you have an allergy to CHG or antibacterial soaps.  If your skin becomes reddened/irritated stop using the CHG and inform your nurse when you arrive at Short Stay. Do not shave (including legs and underarms) for at least 48 hours prior to the first CHG shower.  You may shave your face/neck. Please follow these instructions carefully:  1.  Shower with CHG Soap the night before surgery and the  morning of Surgery.  2.  If you choose to wash your hair, wash your hair first as usual with your  normal  shampoo.  3.  After you shampoo, rinse your hair and body thoroughly to remove the  shampoo.  4.  Use CHG as you would any other liquid soap.  You can apply chg directly  to the skin and wash                       Gently with a scrungie or clean washcloth.  5.  Apply the CHG Soap to your body ONLY FROM  THE NECK DOWN.   Do not use on face/ open                           Wound or open sores. Avoid contact with eyes, ears mouth and genitals (private parts).                       Wash face,  Genitals (private parts) with your normal soap.             6.  Wash thoroughly, paying special attention to the area where your surgery  will be performed.  7.  Thoroughly rinse your body with warm water from the neck down.  8.  DO NOT shower/wash with your normal soap after using and rinsing off  the CHG Soap.                9.  Pat yourself dry with a clean towel.            10.  Wear clean pajamas.            11.  Place clean sheets on your bed the night of your first shower and do not  sleep with pets. Day of Surgery : Do not apply any lotions/deodorants the morning of surgery.  Please wear clean clothes to the hospital/surgery center.  FAILURE TO FOLLOW THESE INSTRUCTIONS MAY RESULT IN THE CANCELLATION OF YOUR SURGERY PATIENT SIGNATURE_________________________________  NURSE SIGNATURE__________________________________  ________________________________________________________________________

## 2016-12-08 NOTE — Progress Notes (Signed)
Please place orders in EPIC as patient has a Pre-Op appointment at [facility] on Wednesday 12/09/2016! Thank you!

## 2016-12-09 ENCOUNTER — Encounter (HOSPITAL_COMMUNITY): Payer: Self-pay

## 2016-12-09 ENCOUNTER — Other Ambulatory Visit (INDEPENDENT_AMBULATORY_CARE_PROVIDER_SITE_OTHER): Payer: Self-pay | Admitting: Physician Assistant

## 2016-12-09 ENCOUNTER — Encounter (HOSPITAL_COMMUNITY)
Admission: RE | Admit: 2016-12-09 | Discharge: 2016-12-09 | Disposition: A | Discharge status: Home / Self Care | Payer: BC Managed Care – PPO | Source: Ambulatory Visit | Attending: Orthopaedic Surgery | Admitting: Orthopaedic Surgery

## 2016-12-09 ENCOUNTER — Emergency Department (HOSPITAL_COMMUNITY)
Admission: EM | Admit: 2016-12-09 | Discharge: 2016-12-09 | Disposition: A | Discharge status: Home / Self Care | Payer: BC Managed Care – PPO | Attending: Emergency Medicine | Admitting: Emergency Medicine

## 2016-12-09 DIAGNOSIS — W228XXD Striking against or struck by other objects, subsequent encounter: Secondary | ICD-10-CM | POA: Insufficient documentation

## 2016-12-09 DIAGNOSIS — S82122D Displaced fracture of lateral condyle of left tibia, subsequent encounter for closed fracture with routine healing: Secondary | ICD-10-CM | POA: Diagnosis not present

## 2016-12-09 DIAGNOSIS — X58XXXA Exposure to other specified factors, initial encounter: Secondary | ICD-10-CM

## 2016-12-09 DIAGNOSIS — I10 Essential (primary) hypertension: Secondary | ICD-10-CM

## 2016-12-09 DIAGNOSIS — Z01818 Encounter for other preprocedural examination: Secondary | ICD-10-CM

## 2016-12-09 DIAGNOSIS — R791 Abnormal coagulation profile: Secondary | ICD-10-CM | POA: Insufficient documentation

## 2016-12-09 DIAGNOSIS — Z7982 Long term (current) use of aspirin: Secondary | ICD-10-CM | POA: Diagnosis not present

## 2016-12-09 DIAGNOSIS — S82142A Displaced bicondylar fracture of left tibia, initial encounter for closed fracture: Secondary | ICD-10-CM | POA: Insufficient documentation

## 2016-12-09 DIAGNOSIS — R001 Bradycardia, unspecified: Secondary | ICD-10-CM | POA: Insufficient documentation

## 2016-12-09 DIAGNOSIS — S82142S Displaced bicondylar fracture of left tibia, sequela: Secondary | ICD-10-CM

## 2016-12-09 DIAGNOSIS — R52 Pain, unspecified: Secondary | ICD-10-CM

## 2016-12-09 LAB — PROTIME-INR
INR: 1.06
PROTHROMBIN TIME: 13.8 s (ref 11.4–15.2)

## 2016-12-09 LAB — CBC WITH DIFFERENTIAL/PLATELET
BASOS PCT: 1 %
Basophils Absolute: 0 10*3/uL (ref 0.0–0.1)
EOS ABS: 0.3 10*3/uL (ref 0.0–0.7)
EOS PCT: 5 %
HCT: 39.2 % (ref 39.0–52.0)
Hemoglobin: 13 g/dL (ref 13.0–17.0)
LYMPHS ABS: 1.1 10*3/uL (ref 0.7–4.0)
Lymphocytes Relative: 18 %
MCH: 28.2 pg (ref 26.0–34.0)
MCHC: 33.2 g/dL (ref 30.0–36.0)
MCV: 85 fL (ref 78.0–100.0)
Monocytes Absolute: 0.5 10*3/uL (ref 0.1–1.0)
Monocytes Relative: 8 %
Neutro Abs: 4 10*3/uL (ref 1.7–7.7)
Neutrophils Relative %: 68 %
PLATELETS: 194 10*3/uL (ref 150–400)
RBC: 4.61 MIL/uL (ref 4.22–5.81)
RDW: 13.8 % (ref 11.5–15.5)
WBC: 5.8 10*3/uL (ref 4.0–10.5)

## 2016-12-09 LAB — BASIC METABOLIC PANEL
Anion gap: 9 (ref 5–15)
BUN: 13 mg/dL (ref 6–20)
CALCIUM: 8.7 mg/dL — AB (ref 8.9–10.3)
CHLORIDE: 103 mmol/L (ref 101–111)
CO2: 24 mmol/L (ref 22–32)
CREATININE: 1.07 mg/dL (ref 0.61–1.24)
GFR calc Af Amer: >60 mL/min (ref >60)
GFR calc non Af Amer: >60 mL/min (ref >60)
Glucose, Bld: 116 mg/dL — ABNORMAL HIGH (ref 65–99)
Potassium: 4.1 mmol/L (ref 3.5–5.1)
SODIUM: 136 mmol/L (ref 135–145)

## 2016-12-09 LAB — URINALYSIS, ROUTINE W REFLEX MICROSCOPIC
Glucose, UA: NEGATIVE mg/dL
HGB URINE DIPSTICK: NEGATIVE
KETONES UR: NEGATIVE mg/dL
Leukocytes, UA: NEGATIVE
NITRITE: NEGATIVE
PROTEIN: NEGATIVE mg/dL
SPECIFIC GRAVITY, URINE: 1.031 — AB (ref 1.005–1.030)
pH: 5 (ref 5.0–8.0)

## 2016-12-09 MED ORDER — MORPHINE SULFATE (PF) 4 MG/ML IV SOLN
4.0000 mg | Freq: Once | INTRAVENOUS | Status: AC
  Administered 2016-12-09: 4 mg via INTRAVENOUS
  Filled 2016-12-09: qty 1

## 2016-12-09 MED ORDER — TAMSULOSIN HCL 0.4 MG PO CAPS: 0.4000 mg | ORAL_CAPSULE | Freq: Every day | ORAL | 0 refills | Status: DC

## 2016-12-09 MED ORDER — POLYETHYLENE GLYCOL 3350 17 G PO PACK: 17.0000 g | PACK | Freq: Every day | ORAL | 0 refills | Status: DC

## 2016-12-09 MED ORDER — CELECOXIB 200 MG PO CAPS: 200.0000 mg | ORAL_CAPSULE | Freq: Every day | ORAL | 1 refills | Status: DC

## 2016-12-09 MED ORDER — DOCUSATE SODIUM 100 MG PO CAPS: 100.0000 mg | ORAL_CAPSULE | Freq: Two times a day (BID) | ORAL | 0 refills | Status: DC

## 2016-12-09 MED ORDER — SODIUM CHLORIDE 0.9 % IV SOLN
Freq: Once | INTRAVENOUS | Status: AC
  Administered 2016-12-09: 11:00:00 via INTRAVENOUS

## 2016-12-09 MED ORDER — VITAMIN C 500 MG PO TABS: 500.0000 mg | ORAL_TABLET | Freq: Every day | ORAL | 0 refills | Status: DC

## 2016-12-09 MED ORDER — HYDROMORPHONE HCL 4 MG PO TABS: 4.0000 mg | ORAL_TABLET | ORAL | 0 refills | Status: DC | PRN

## 2016-12-09 MED ORDER — HYDROMORPHONE HCL 1 MG/ML IJ SOLN
1.0000 mg | Freq: Once | INTRAMUSCULAR | Status: AC
  Administered 2016-12-09: 1 mg via INTRAMUSCULAR
  Filled 2016-12-09: qty 1

## 2016-12-09 NOTE — ED Provider Notes (Signed)
[facility] DEPT Provider Note   CSN: [id number] Arrival date & time: 12/09/16  0850     History   Chief Complaint Chief Complaint  Patient presents with  . Leg Pain  . Tingling  . Weakness    HPI Harold Robinson is a 46 y.o. male.  HPI Patient reports that he was injured on 12\30 by his hay elevator. It moved and struck him on the outside lateral left leg. He developed severe pain and was seen in the emergency department. He is diagnosed with tibial plateau fracture and seen by Dr. Ninfa Linden. Patient reports that pain has continued to escalate since the injury continuously. He reports that he is getting very little pain relief and was unable to sleep all night. He states that yesterday he started getting tingling in his foot and that today's foot feels tingly and kind of numb like it's "cardboard". He reports she's had chills and sweats through the night but he thinks is because he is in pain. His wife has been measuring a temperature and there is been no fever. He reports he felt slightly short of breath but he thinks is due to pain as well. He denies chest pain or cough. Patient reports he has been taking Percocet but it gives him temporary relief. He reports that he has had decreased bowel movements and feels that he is getting constipated. Patient's wife reports that on Saturday he seemed to be having difficulty urinating. Past Medical History:  Diagnosis Date  . Arthritis    neck  . Esophagitis   . Hiatal hernia   . Neck pain   . Pleurisy     Patient Active Problem List   Diagnosis Date Noted  . Tibial plateau fracture, left, closed, initial encounter 12/05/2016  . Vitamin B 12 deficiency 11/27/2013  . History of depression 05/26/2013  . COSTOCHONDRITIS 05/26/2010  . GERD 08/20/2009  . BACK PAIN, LUMBAR 08/20/2009  . Essential hypertension 06/04/2009  . CHEST PAIN 06/04/2009    Past Surgical History:  Procedure Laterality Date  . WISDOM TOOTH EXTRACTION          Home Medications    Prior to Admission medications   Medication Sig Start Date End Date Taking? Authorizing Provider  albuterol (PROVENTIL HFA;VENTOLIN HFA) 108 (90 BASE) MCG/ACT inhaler Inhale 2 puffs into the lungs every 4 (four) hours as needed for wheezing. 05/28/14  Yes [name], MD  aspirin EC 81 MG tablet Take 81 mg by mouth daily.   Yes Historical Provider, MD  docusate sodium (COLACE) 100 MG capsule Take 100 mg by mouth daily.   Yes Historical Provider, MD  methocarbamol (ROBAXIN) 500 MG tablet Take 1 tablet (500 mg total) by mouth every 6 (six) hours as needed for muscle spasms. 12/05/16  Yes Mcarthur Rossetti, MD  metoprolol succinate (TOPROL-XL) 50 MG 24 hr tablet TAKE 1 TABLET (50 MG TOTAL) BY MOUTH DAILY. 07/28/13  Yes [name], MD  oxyCODONE-acetaminophen (ROXICET) 5-325 MG tablet Take 1-2 tablets by mouth every 4 (four) hours as needed. Patient taking differently: Take 1-2 tablets by mouth every 4 (four) hours as needed for moderate pain (depends on pain level if takes 1 -2 tablets).  12/05/16  Yes Mcarthur Rossetti, MD  sertraline (ZOLOFT) 50 MG tablet TAKE 1 & 1/2 TABLETS BY MOUTH DAILY Patient taking differently: TAKE 1 TABLETS BY MOUTH DAILY 09/14/16  Yes [name], MD  celecoxib (CELEBREX) 200 MG capsule Take 1 capsule (200 mg total)  by mouth daily. 12/09/16   Pete Pelt, PA-C  clidinium-chlordiazePOXIDE (LIBRAX) 5-2.5 MG per capsule Take one capsule by mouth every 6-8 hours for abdominal cramping Patient not taking: Reported on 12/08/2016 10/31/13   Sable Feil, MD  cyanocobalamin (,VITAMIN B-12,) 1000 MCG/ML injection Inject 1 mL (1,000 mcg total) into the muscle once. Patient not taking: Reported on 05/17/2015 10/13/13   Sable Feil, MD  cyclobenzaprine (FLEXERIL) 10 MG tablet TAKE 1 TABLET BY MOUTH EVERY 8 HOURS AS NEEDED FO RMUSCLE SPASMS Patient not taking: Reported on 12/08/2016 03/16/13   Laurey Morale, MD   HYDROmorphone (DILAUDID) 4 MG tablet Take 1 tablet (4 mg total) by mouth every 4 (four) hours as needed for severe pain. 12/09/16   Pete Pelt, PA-C  metoprolol succinate (TOPROL-XL) 50 MG 24 hr tablet TAKE 1 TABLET (50 MG TOTAL) BY MOUTH DAILY. Patient not taking: Reported on 12/08/2016 07/23/16   [name], MD  tamsulosin (FLOMAX) 0.4 MG CAPS capsule Take 1 capsule (0.4 mg total) by mouth daily. 12/09/16   Pete Pelt, PA-C  vitamin C (ASCORBIC ACID) 500 MG tablet Take 1 tablet (500 mg total) by mouth daily. 12/09/16   Pete Pelt, PA-C    Family History Family History  Problem Relation Age of Onset  . Heart disease Father   . Heart disease Paternal Aunt   . Heart disease Paternal Uncle   . Hypertension Mother   . Colon cancer Neg Hx   . Esophageal cancer Neg Hx   . Rectal cancer Neg Hx   . Stomach cancer Neg Hx     Social History Social History  Substance Use Topics  . Smoking status: Never Smoker  . Smokeless tobacco: Never Used  . Alcohol use No     Allergies   Patient has no known allergies.   Review of Systems Review of Systems 10 Systems reviewed and are negative for acute change except as noted in the HPI.  Physical Exam Updated Vital Signs BP 130/74   Pulse 74   Temp 98.5 F (36.9 C) (Oral)   Resp 16   Ht 5\' 11"  (1.803 m)   Wt 230 lb (104.3 kg)   SpO2 92%   BMI 32.08 kg/m   Physical Exam  Constitutional: He is oriented to person, place, and time.  Patient is alert and nontoxic. No respiratory distress at rest. Color is good. He does appear to be in moderate pain.  HENT:  Head: Normocephalic and atraumatic.  Eyes: Conjunctivae and EOM are normal.  Cardiovascular: Normal rate, regular rhythm, normal heart sounds and intact distal pulses.   Pulmonary/Chest: Effort normal and breath sounds normal.  Abdominal: Soft. Bowel sounds are normal. He exhibits no distension. There is no tenderness. There is no guarding.  Musculoskeletal:  Left  knee has moderate, diffuse swelling relative to the right. This extends up to appreciable mild to moderate swelling of the lower portion of the thigh. Tenderness in the popliteal fossa to palpation. 1+ diffuse edema of the lower leg. The calf is soft without significant reproducible pain to palpation of the muscle body of the gastrocnemius or the anterior tibialis. Dorsalis pedis pulses are palpable and symmetric at 2+. Patient can spontaneously move his toes on the left lower extremity.  Neurological: He is alert and oriented to person, place, and time. He exhibits normal muscle tone. Coordination normal.  Skin: Skin is warm and dry.  Psychiatric: He has a normal mood and affect.  ED Treatments / Results  Labs (all labs ordered are listed, but only abnormal results are displayed) Labs Reviewed  URINALYSIS, ROUTINE W REFLEX MICROSCOPIC - Abnormal; Notable for the following:       Result Value   Specific Gravity, Urine 1.031 (*)    Bilirubin Urine MODERATE (*)    All other components within normal limits  BASIC METABOLIC PANEL - Abnormal; Notable for the following:    Glucose, Bld 116 (*)    Calcium 8.7 (*)    All other components within normal limits  CBC WITH DIFFERENTIAL/PLATELET  PROTIME-INR    EKG  EKG Interpretation None       Radiology No results found.  Procedures Procedures (including critical care time)  Medications Ordered in ED Medications  HYDROmorphone (DILAUDID) injection 1 mg (1 mg Intramuscular Given 12/09/16 1010)  0.9 %  sodium chloride infusion ( Intravenous New Bag/Given 12/09/16 1059)  morphine 4 MG/ML injection 4 mg (4 mg Intravenous Given 12/09/16 1058)     Initial Impression / Assessment and Plan / ED Course  I have reviewed the triage vital signs and the nursing notes.  Pertinent labs & imaging results that were available during my care of the patient were reviewed by me and considered in my medical decision making (see chart for  details).  Clinical Course    Consult: (10:44) [facility]. Will see in ED.  Final Clinical Impressions(s) / ED Diagnoses   Final diagnoses:  Tibial plateau fracture, left, sequela  Intractable pain   Orthopedic consultation has been obtained. Orthopedics have provided medication prescriptions for improved pain control. At this time, I do not suspect popliteal arterial injury, compartment syndrome or other emergent condition. Patient is otherwise alert and appropriate. He does not show signs of other injury complications at this time. I will be to proceed as per worse though with surgery on Friday and medication prescriptions per orthopedics. New Prescriptions Current Discharge Medication List    START taking these medications   Details  celecoxib (CELEBREX) 200 MG capsule Take 1 capsule (200 mg total) by mouth daily. Qty: 30 capsule, Refills: 1    HYDROmorphone (DILAUDID) 4 MG tablet Take 1 tablet (4 mg total) by mouth every 4 (four) hours as needed for severe pain. Qty: 30 tablet, Refills: 0    tamsulosin (FLOMAX) 0.4 MG CAPS capsule Take 1 capsule (0.4 mg total) by mouth daily. Qty: 30 capsule, Refills: 0    vitamin C (ASCORBIC ACID) 500 MG tablet Take 1 tablet (500 mg total) by mouth daily. Qty: 90 tablet, Refills: 0         Charlesetta Shanks, MD 12/09/16 1303

## 2016-12-09 NOTE — ED Triage Notes (Signed)
Pt c/o increasing generalized weakness and LLE pain and tingling r/t L tibal plateau fracture x 4 days.  Pain score 10/10.  Pt reports taking prescribed pain medication w/o relief.  Pt reports that he has surgery scheduled for 1/5.

## 2016-12-09 NOTE — Progress Notes (Signed)
Subjective:    Patient reports pain as moderate to severe.   Reports numbness in toes. Having trouble urinating Objective: Vital signs in last 24 hours: Temp:  [98.5 F (36.9 C)-98.6 F (37 C)] 98.5 F (36.9 C) (01/03 0904) Pulse Rate:  [55-74] 74 (01/03 1211) Resp:  [16-24] 16 (01/03 1211) BP: (130-141)/(74-85) 130/74 (01/03 1211) SpO2:  [92 %-100 %] 92 % (01/03 1211) Weight:  [230 lb (104.3 kg)] 230 lb (104.3 kg) (01/03 0905)  Intake/Output from previous day: No intake/output data recorded. Intake/Output this shift: No intake/output data recorded.   Recent Labs  12/09/16 1048  HGB 13.0    Recent Labs  12/09/16 1048  WBC 5.8  RBC 4.61  HCT 39.2  PLT 194    Recent Labs  12/09/16 1048  NA 136  K 4.1  CL 103  CO2 24  BUN 13  CREATININE 1.07  GLUCOSE 116*  CALCIUM 8.7*    Recent Labs  12/09/16 1048  INR 1.06   Left lower leg: Neurovascular intact Sensation intact distally Intact pulses distally Dorsiflexion/Plantar flexion intact Compartment soft  Assessment/Plan:  No evidence of compartment syndrome.    Elevation left leg above heart Stop percocet start Dilaudid, start Vit C, Celebrex .  Surgery Friday for ORIF of left tibial plateau fracture.  Urinary retention start Flomax.   Itati Brocksmith 12/09/2016, 12:24 PM

## 2016-12-09 NOTE — ED Notes (Signed)
Urinal given to pt and encouraged to void.

## 2016-12-11 ENCOUNTER — Ambulatory Visit (HOSPITAL_COMMUNITY): Payer: BC Managed Care – PPO

## 2016-12-11 ENCOUNTER — Inpatient Hospital Stay (HOSPITAL_COMMUNITY)
Admission: AD | Admit: 2016-12-11 | Discharge: 2016-12-14 | DRG: 489 | Disposition: A | Discharge status: Home / Self Care | Payer: BC Managed Care – PPO | Source: Ambulatory Visit | Attending: Orthopaedic Surgery | Admitting: Orthopaedic Surgery

## 2016-12-11 ENCOUNTER — Ambulatory Visit (HOSPITAL_COMMUNITY): Payer: BC Managed Care – PPO | Admitting: Anesthesiology

## 2016-12-11 ENCOUNTER — Encounter (HOSPITAL_COMMUNITY)
Admission: AD | Disposition: A | Discharge status: Home / Self Care | Payer: Self-pay | Source: Ambulatory Visit | Attending: Orthopaedic Surgery

## 2016-12-11 ENCOUNTER — Encounter (HOSPITAL_COMMUNITY): Payer: Self-pay | Admitting: *Deleted

## 2016-12-11 DIAGNOSIS — I10 Essential (primary) hypertension: Secondary | ICD-10-CM | POA: Diagnosis present

## 2016-12-11 DIAGNOSIS — Z87891 Personal history of nicotine dependence: Secondary | ICD-10-CM | POA: Diagnosis not present

## 2016-12-11 DIAGNOSIS — T148XXA Other injury of unspecified body region, initial encounter: Secondary | ICD-10-CM

## 2016-12-11 DIAGNOSIS — M25562 Pain in left knee: Secondary | ICD-10-CM | POA: Diagnosis present

## 2016-12-11 DIAGNOSIS — W208XXA Other cause of strike by thrown, projected or falling object, initial encounter: Secondary | ICD-10-CM | POA: Diagnosis present

## 2016-12-11 DIAGNOSIS — K219 Gastro-esophageal reflux disease without esophagitis: Secondary | ICD-10-CM | POA: Diagnosis present

## 2016-12-11 DIAGNOSIS — S82142A Displaced bicondylar fracture of left tibia, initial encounter for closed fracture: Secondary | ICD-10-CM | POA: Diagnosis present

## 2016-12-11 DIAGNOSIS — F419 Anxiety disorder, unspecified: Secondary | ICD-10-CM | POA: Diagnosis present

## 2016-12-11 DIAGNOSIS — Z79899 Other long term (current) drug therapy: Secondary | ICD-10-CM

## 2016-12-11 DIAGNOSIS — Z419 Encounter for procedure for purposes other than remedying health state, unspecified: Secondary | ICD-10-CM

## 2016-12-11 HISTORY — DX: Anxiety disorder, unspecified: F41.9

## 2016-12-11 HISTORY — DX: Essential (primary) hypertension: I10

## 2016-12-11 HISTORY — DX: Dyspnea, unspecified: R06.00

## 2016-12-11 HISTORY — PX: ORIF TIBIA PLATEAU: SHX2132

## 2016-12-11 SURGERY — OPEN REDUCTION INTERNAL FIXATION (ORIF) TIBIAL PLATEAU
Anesthesia: General | Site: Leg Lower | Laterality: Left

## 2016-12-11 MED ORDER — OXYCODONE HCL 5 MG PO TABS
5.0000 mg | ORAL_TABLET | ORAL | Status: DC | PRN
  Administered 2016-12-12 – 2016-12-13 (×6): 10 mg via ORAL
  Filled 2016-12-11 (×6): qty 2

## 2016-12-11 MED ORDER — FENTANYL CITRATE (PF) 100 MCG/2ML IJ SOLN
INTRAMUSCULAR | Status: DC | PRN
Start: 2016-12-11 — End: 2016-12-11
  Administered 2016-12-11 (×4): 50 ug via INTRAVENOUS

## 2016-12-11 MED ORDER — POLYETHYLENE GLYCOL 3350 17 G PO PACK
17.0000 g | PACK | Freq: Every day | ORAL | Status: DC
  Administered 2016-12-12 – 2016-12-14 (×3): 17 g via ORAL
  Filled 2016-12-11 (×3): qty 1

## 2016-12-11 MED ORDER — METOCLOPRAMIDE HCL 5 MG PO TABS: 5.0000 mg | ORAL_TABLET | Freq: Three times a day (TID) | ORAL | Status: DC | PRN

## 2016-12-11 MED ORDER — ALBUTEROL SULFATE (2.5 MG/3ML) 0.083% IN NEBU
2.5000 mg | INHALATION_SOLUTION | Freq: Once | RESPIRATORY_TRACT | Status: AC
  Administered 2016-12-11: 2.5 mg via RESPIRATORY_TRACT

## 2016-12-11 MED ORDER — MIDAZOLAM HCL 2 MG/2ML IJ SOLN
INTRAMUSCULAR | Status: AC
  Filled 2016-12-11: qty 2

## 2016-12-11 MED ORDER — DOCUSATE SODIUM 100 MG PO CAPS
100.0000 mg | ORAL_CAPSULE | Freq: Two times a day (BID) | ORAL | Status: DC
  Administered 2016-12-12 – 2016-12-14 (×6): 100 mg via ORAL
  Filled 2016-12-11 (×6): qty 1

## 2016-12-11 MED ORDER — ONDANSETRON HCL 4 MG PO TABS
4.0000 mg | ORAL_TABLET | Freq: Four times a day (QID) | ORAL | Status: DC | PRN
  Administered 2016-12-13: 4 mg via ORAL
  Filled 2016-12-11: qty 1

## 2016-12-11 MED ORDER — PROPOFOL 10 MG/ML IV BOLUS
INTRAVENOUS | Status: AC
  Filled 2016-12-11: qty 60

## 2016-12-11 MED ORDER — HYDROMORPHONE HCL 1 MG/ML IJ SOLN
1.0000 mg | Freq: Once | INTRAMUSCULAR | Status: AC
  Administered 2016-12-11: 1 mg via INTRAVENOUS

## 2016-12-11 MED ORDER — CHLORHEXIDINE GLUCONATE 4 % EX LIQD: 60.0000 mL | Freq: Once | CUTANEOUS | Status: DC

## 2016-12-11 MED ORDER — VITAMIN C 500 MG PO TABS
500.0000 mg | ORAL_TABLET | Freq: Every day | ORAL | Status: DC
  Administered 2016-12-12 – 2016-12-14 (×3): 500 mg via ORAL
  Filled 2016-12-11 (×3): qty 1

## 2016-12-11 MED ORDER — METOPROLOL TARTRATE 5 MG/5ML IV SOLN
5.0000 mg | Freq: Once | INTRAVENOUS | Status: AC
  Administered 2016-12-11: 5 mg via INTRAVENOUS

## 2016-12-11 MED ORDER — HYDROMORPHONE HCL 1 MG/ML IJ SOLN
0.2500 mg | INTRAMUSCULAR | Status: DC | PRN
  Administered 2016-12-11 (×4): 0.5 mg via INTRAVENOUS

## 2016-12-11 MED ORDER — ONDANSETRON HCL 4 MG/2ML IJ SOLN
INTRAMUSCULAR | Status: DC | PRN
  Administered 2016-12-11: 4 mg via INTRAVENOUS

## 2016-12-11 MED ORDER — MIDAZOLAM HCL 5 MG/5ML IJ SOLN
INTRAMUSCULAR | Status: DC | PRN
  Administered 2016-12-11: 2 mg via INTRAVENOUS

## 2016-12-11 MED ORDER — ALBUTEROL SULFATE (2.5 MG/3ML) 0.083% IN NEBU: 2.5000 mg | INHALATION_SOLUTION | RESPIRATORY_TRACT | Status: DC | PRN

## 2016-12-11 MED ORDER — FENTANYL CITRATE (PF) 100 MCG/2ML IJ SOLN
INTRAMUSCULAR | Status: AC
  Filled 2016-12-11: qty 2

## 2016-12-11 MED ORDER — TAMSULOSIN HCL 0.4 MG PO CAPS
0.4000 mg | ORAL_CAPSULE | Freq: Every day | ORAL | Status: DC
  Administered 2016-12-12 – 2016-12-14 (×3): 0.4 mg via ORAL
  Filled 2016-12-11 (×3): qty 1

## 2016-12-11 MED ORDER — ONDANSETRON HCL 4 MG/2ML IJ SOLN
4.0000 mg | Freq: Four times a day (QID) | INTRAMUSCULAR | Status: DC | PRN
  Filled 2016-12-11: qty 2

## 2016-12-11 MED ORDER — CEFAZOLIN IN D5W 1 GM/50ML IV SOLN
1.0000 g | Freq: Four times a day (QID) | INTRAVENOUS | Status: AC
  Administered 2016-12-12 (×3): 1 g via INTRAVENOUS
  Filled 2016-12-11 (×3): qty 50

## 2016-12-11 MED ORDER — METOCLOPRAMIDE HCL 5 MG/ML IJ SOLN: 5.0000 mg | Freq: Three times a day (TID) | INTRAMUSCULAR | Status: DC | PRN

## 2016-12-11 MED ORDER — CEFAZOLIN SODIUM-DEXTROSE 2-4 GM/100ML-% IV SOLN
2.0000 g | INTRAVENOUS | Status: AC
  Administered 2016-12-11: 2 g via INTRAVENOUS
  Filled 2016-12-11: qty 100

## 2016-12-11 MED ORDER — ALBUTEROL SULFATE (2.5 MG/3ML) 0.083% IN NEBU
INHALATION_SOLUTION | RESPIRATORY_TRACT | Status: AC
  Administered 2016-12-11: 2.5 mg via RESPIRATORY_TRACT
  Filled 2016-12-11: qty 3

## 2016-12-11 MED ORDER — HYDROMORPHONE HCL 1 MG/ML IJ SOLN
INTRAMUSCULAR | Status: AC
  Administered 2016-12-11: 0.5 mg via INTRAVENOUS
  Filled 2016-12-11: qty 1

## 2016-12-11 MED ORDER — MEPERIDINE HCL 50 MG/ML IJ SOLN: 6.2500 mg | INTRAMUSCULAR | Status: DC | PRN

## 2016-12-11 MED ORDER — ACETAMINOPHEN 650 MG RE SUPP: 650.0000 mg | Freq: Four times a day (QID) | RECTAL | Status: DC | PRN

## 2016-12-11 MED ORDER — HYDROMORPHONE HCL 1 MG/ML IJ SOLN
INTRAMUSCULAR | Status: AC
  Filled 2016-12-11: qty 1

## 2016-12-11 MED ORDER — LIDOCAINE 2% (20 MG/ML) 5 ML SYRINGE
INTRAMUSCULAR | Status: DC | PRN
  Administered 2016-12-11: 60 mg via INTRAVENOUS

## 2016-12-11 MED ORDER — CEFAZOLIN SODIUM-DEXTROSE 2-4 GM/100ML-% IV SOLN
INTRAVENOUS | Status: AC
  Filled 2016-12-11: qty 100

## 2016-12-11 MED ORDER — METHOCARBAMOL 500 MG PO TABS: 500.0000 mg | ORAL_TABLET | Freq: Four times a day (QID) | ORAL | Status: DC | PRN

## 2016-12-11 MED ORDER — FENTANYL CITRATE (PF) 100 MCG/2ML IJ SOLN
25.0000 ug | INTRAMUSCULAR | Status: DC | PRN
  Administered 2016-12-11 (×2): 50 ug via INTRAVENOUS

## 2016-12-11 MED ORDER — SERTRALINE HCL 50 MG PO TABS
75.0000 mg | ORAL_TABLET | Freq: Every day | ORAL | Status: DC
  Administered 2016-12-12 – 2016-12-14 (×3): 75 mg via ORAL
  Filled 2016-12-11 (×3): qty 2

## 2016-12-11 MED ORDER — FENTANYL CITRATE (PF) 100 MCG/2ML IJ SOLN
INTRAMUSCULAR | Status: AC
  Administered 2016-12-11: 50 ug via INTRAVENOUS
  Filled 2016-12-11: qty 2

## 2016-12-11 MED ORDER — METOCLOPRAMIDE HCL 5 MG/ML IJ SOLN: 10.0000 mg | Freq: Once | INTRAMUSCULAR | Status: DC | PRN

## 2016-12-11 MED ORDER — METOPROLOL TARTRATE 5 MG/5ML IV SOLN
INTRAVENOUS | Status: AC
  Filled 2016-12-11: qty 5

## 2016-12-11 MED ORDER — HYDROMORPHONE HCL 2 MG PO TABS
4.0000 mg | ORAL_TABLET | ORAL | Status: DC | PRN
  Administered 2016-12-11 – 2016-12-14 (×7): 4 mg via ORAL
  Filled 2016-12-11 (×7): qty 2

## 2016-12-11 MED ORDER — DIPHENHYDRAMINE HCL 12.5 MG/5ML PO ELIX
12.5000 mg | ORAL_SOLUTION | ORAL | Status: DC | PRN
  Administered 2016-12-13: 25 mg via ORAL
  Filled 2016-12-11: qty 10

## 2016-12-11 MED ORDER — PHENYLEPHRINE 40 MCG/ML (10ML) SYRINGE FOR IV PUSH (FOR BLOOD PRESSURE SUPPORT)
PREFILLED_SYRINGE | INTRAVENOUS | Status: AC
  Filled 2016-12-11: qty 10

## 2016-12-11 MED ORDER — METHOCARBAMOL 500 MG PO TABS
500.0000 mg | ORAL_TABLET | Freq: Four times a day (QID) | ORAL | Status: DC | PRN
  Administered 2016-12-12 – 2016-12-13 (×6): 500 mg via ORAL
  Filled 2016-12-11 (×6): qty 1

## 2016-12-11 MED ORDER — METOPROLOL SUCCINATE ER 50 MG PO TB24
50.0000 mg | ORAL_TABLET | Freq: Every day | ORAL | Status: DC
  Administered 2016-12-12 – 2016-12-14 (×4): 50 mg via ORAL
  Filled 2016-12-11 (×4): qty 1

## 2016-12-11 MED ORDER — METHOCARBAMOL 1000 MG/10ML IJ SOLN
500.0000 mg | Freq: Four times a day (QID) | INTRAMUSCULAR | Status: DC | PRN
  Administered 2016-12-11: 500 mg via INTRAVENOUS
  Filled 2016-12-11 (×2): qty 5
  Filled 2016-12-11: qty 550

## 2016-12-11 MED ORDER — ALBUTEROL SULFATE HFA 108 (90 BASE) MCG/ACT IN AERS: 2.0000 | INHALATION_SPRAY | RESPIRATORY_TRACT | Status: DC | PRN

## 2016-12-11 MED ORDER — LACTATED RINGERS IV SOLN
INTRAVENOUS | Status: DC
  Administered 2016-12-11 (×3): via INTRAVENOUS

## 2016-12-11 MED ORDER — SODIUM CHLORIDE 0.9 % IR SOLN
Status: DC | PRN
  Administered 2016-12-11: 1000 mL

## 2016-12-11 MED ORDER — SODIUM CHLORIDE 0.9 % IV SOLN
INTRAVENOUS | Status: DC
  Administered 2016-12-11: 23:00:00 via INTRAVENOUS

## 2016-12-11 MED ORDER — HYDROMORPHONE HCL 1 MG/ML IJ SOLN
1.0000 mg | INTRAMUSCULAR | Status: DC | PRN
  Administered 2016-12-11: 23:00:00 1 mg via INTRAVENOUS
  Filled 2016-12-11: qty 1

## 2016-12-11 MED ORDER — PROPOFOL 500 MG/50ML IV EMUL
INTRAVENOUS | Status: DC | PRN
  Administered 2016-12-11: 200 mg via INTRAVENOUS

## 2016-12-11 MED ORDER — LACTATED RINGERS IV SOLN: INTRAVENOUS | Status: DC

## 2016-12-11 MED ORDER — ACETAMINOPHEN 325 MG PO TABS
650.0000 mg | ORAL_TABLET | Freq: Four times a day (QID) | ORAL | Status: DC | PRN
  Administered 2016-12-12 – 2016-12-13 (×2): 650 mg via ORAL
  Filled 2016-12-11 (×2): qty 2

## 2016-12-11 MED ORDER — KETOROLAC TROMETHAMINE 15 MG/ML IJ SOLN
7.5000 mg | Freq: Four times a day (QID) | INTRAMUSCULAR | Status: DC
  Administered 2016-12-12: 7.5 mg via INTRAVENOUS
  Filled 2016-12-11: qty 1

## 2016-12-11 SURGICAL SUPPLY — 65 items
BAG ZIPLOCK 12X15 (MISCELLANEOUS) IMPLANT
BANDAGE ACE 4X5 VEL STRL LF (GAUZE/BANDAGES/DRESSINGS) ×2 IMPLANT
BANDAGE ACE 6X5 VEL STRL LF (GAUZE/BANDAGES/DRESSINGS) ×2 IMPLANT
BANDAGE ESMARK 6X9 LF (GAUZE/BANDAGES/DRESSINGS) ×1 IMPLANT
BIT DRILL 100X2.5XANTM LCK (BIT) ×1 IMPLANT
BIT DRILL CAL (BIT) ×1 IMPLANT
BIT DRILL SLEEVE MEASURING (BIT) ×1 IMPLANT
BIT DRL 100X2.5XANTM LCK (BIT) ×1
BNDG COHESIVE 6X5 TAN STRL LF (GAUZE/BANDAGES/DRESSINGS) ×2 IMPLANT
BNDG ESMARK 6X9 LF (GAUZE/BANDAGES/DRESSINGS) ×2
BONE CHIP PRESERV 30CC PCAN30 (Bone Implant) ×2 IMPLANT
CUFF TOURN SGL QUICK 34 (TOURNIQUET CUFF) ×1
CUFF TRNQT CYL 34X4X40X1 (TOURNIQUET CUFF) ×1 IMPLANT
DRAPE C-ARM 42X120 X-RAY (DRAPES) ×2 IMPLANT
DRAPE C-ARMOR (DRAPES) ×2 IMPLANT
DRAPE SHEET LG 3/4 BI-LAMINATE (DRAPES) IMPLANT
DRAPE U-SHAPE 47X51 STRL (DRAPES) ×2 IMPLANT
DRILL BIT 2.5MM (BIT) ×1
DRILL BIT CAL (BIT) ×2
DRILL SLEEVE MEASURING (BIT) ×2
DRSG PAD ABDOMINAL 8X10 ST (GAUZE/BANDAGES/DRESSINGS) ×2 IMPLANT
DURAPREP 26ML APPLICATOR (WOUND CARE) ×2 IMPLANT
ELECT REM PT RETURN 9FT ADLT (ELECTROSURGICAL) ×2
ELECTRODE REM PT RTRN 9FT ADLT (ELECTROSURGICAL) ×1 IMPLANT
GAUZE SPONGE 4X4 12PLY STRL (GAUZE/BANDAGES/DRESSINGS) ×2 IMPLANT
GAUZE XEROFORM 5X9 LF (GAUZE/BANDAGES/DRESSINGS) ×2 IMPLANT
GLOVE BIO SURGEON STRL SZ7.5 (GLOVE) ×2 IMPLANT
GLOVE BIOGEL PI IND STRL 8 (GLOVE) ×2 IMPLANT
GLOVE BIOGEL PI INDICATOR 8 (GLOVE) ×2
GLOVE ECLIPSE 8.0 STRL XLNG CF (GLOVE) ×2 IMPLANT
GOWN STRL REUS W/TWL XL LVL3 (GOWN DISPOSABLE) ×4 IMPLANT
IMMOBILIZER KNEE 20 (SOFTGOODS)
IMMOBILIZER KNEE 20 THIGH 36 (SOFTGOODS) IMPLANT
K-WIRE ACE 1.6X6 (WIRE) ×4
KIT BASIN OR (CUSTOM PROCEDURE TRAY) ×2 IMPLANT
KWIRE ACE 1.6X6 (WIRE) ×2 IMPLANT
MANIFOLD NEPTUNE II (INSTRUMENTS) ×2 IMPLANT
PACK ORTHO EXTREMITY (CUSTOM PROCEDURE TRAY) ×2 IMPLANT
PAD CAST 4YDX4 CTTN HI CHSV (CAST SUPPLIES) ×1 IMPLANT
PADDING CAST COTTON 4X4 STRL (CAST SUPPLIES) ×1
PADDING CAST COTTON 6X4 STRL (CAST SUPPLIES) ×4 IMPLANT
PLATE LOCK 5H STD LT PROX TIB (Plate) ×2 IMPLANT
POSITIONER SURGICAL ARM (MISCELLANEOUS) ×2 IMPLANT
SCREW CORTICAL 3.5MM  42MM (Screw) ×1 IMPLANT
SCREW CORTICAL 3.5MM 40MM (Screw) ×2 IMPLANT
SCREW CORTICAL 3.5MM 42MM (Screw) ×1 IMPLANT
SCREW LOCK CORT STAR 3.5X50 (Screw) ×2 IMPLANT
SCREW LOCK CORT STAR 3.5X56 (Screw) ×2 IMPLANT
SCREW LOCK CORT STAR 3.5X60 (Screw) ×2 IMPLANT
SCREW LOCK CORT STAR 3.5X80 (Screw) ×6 IMPLANT
SCREW LOCK CORT STAR 3.5X85 (Screw) ×2 IMPLANT
SCREW LP 3.5X90MM (Screw) ×2 IMPLANT
SPONGE LAP 18X18 X RAY DECT (DISPOSABLE) ×4 IMPLANT
STAPLER VISISTAT 35W (STAPLE) IMPLANT
STOCKINETTE 8 INCH (MISCELLANEOUS) ×2 IMPLANT
SUCTION FRAZIER HANDLE 10FR (MISCELLANEOUS) ×1
SUCTION TUBE FRAZIER 10FR DISP (MISCELLANEOUS) ×1 IMPLANT
SUT ETHILON 2 0 PS N (SUTURE) ×4 IMPLANT
SUT VIC AB 0 CT1 27 (SUTURE) ×1
SUT VIC AB 0 CT1 27XBRD ANTBC (SUTURE) ×1 IMPLANT
SUT VIC AB 1 CT1 36 (SUTURE) ×2 IMPLANT
SUT VIC AB 2-0 CT1 27 (SUTURE) ×2
SUT VIC AB 2-0 CT1 TAPERPNT 27 (SUTURE) ×2 IMPLANT
TOWEL OR 17X26 10 PK STRL BLUE (TOWEL DISPOSABLE) ×2 IMPLANT
TRAY FOLEY W/METER SILVER 16FR (SET/KITS/TRAYS/PACK) IMPLANT

## 2016-12-11 NOTE — Progress Notes (Signed)
Pt in stretcher after transfer from wheelchair for pre-op interview and has audible wheezing initially.  He does not know his diagnosis for inhalers but says that he using them sometimes "after fire calls." States pain is 10/10, notified anesthesia area.

## 2016-12-11 NOTE — H&P (Signed)
Harold Robinson is an 46 y.o. male.   Chief Complaint:   Left knee pain; known unstable tibial plateau fracture HPI:   46 yo male who is 6 days out from a crush injury to his left leg in which he sustained an unstable left tibial plateau fracture.  Surgery has been recommended to address this injury.  He now presents for definitive surgery.  Past Medical History:  Diagnosis Date  . Anxiety   . Arthritis    neck  . 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 Esophagitis   . Hiatal hernia   . Hypertension   . Neck pain   . Pleurisy     Past Surgical History:  Procedure Laterality Date  . WISDOM TOOTH EXTRACTION      Family History  Problem Relation Age of Onset  . Heart disease Father   . Heart disease Paternal Aunt   . Heart disease Paternal Uncle   . Hypertension Mother   . Colon cancer Neg Hx   . Esophageal cancer Neg Hx   . Rectal cancer Neg Hx   . Stomach cancer Neg Hx    Social History:  reports that he has never smoked. He has never used smokeless tobacco. He reports that he does not drink alcohol or use drugs.  Allergies: No Known Allergies  Medications Prior to Admission  Medication Sig Dispense Refill  . albuterol (PROVENTIL HFA;VENTOLIN HFA) 108 (90 BASE) MCG/ACT inhaler Inhale 2 puffs into the lungs every 4 (four) hours as needed for wheezing. 18 g 1  . celecoxib (CELEBREX) 200 MG capsule Take 1 capsule (200 mg total) by mouth daily. 30 capsule 1  . docusate sodium (COLACE) 100 MG capsule Take 100 mg by mouth daily.    . methocarbamol (ROBAXIN) 500 MG tablet Take 1 tablet (500 mg total) by mouth every 6 (six) hours as needed for muscle spasms. 60 tablet 0  . metoprolol succinate (TOPROL-XL) 50 MG 24 hr tablet TAKE 1 TABLET (50 MG TOTAL) BY MOUTH DAILY. 30 tablet 11  . sertraline (ZOLOFT) 50 MG tablet TAKE 1 & 1/2 TABLETS BY MOUTH DAILY (Patient taking differently: TAKE 1 TABLETS BY MOUTH DAILY) 45 tablet 0  . clidinium-chlordiazePOXIDE  (LIBRAX) 5-2.5 MG per capsule Take one capsule by mouth every 6-8 hours for abdominal cramping (Patient not taking: Reported on 12/08/2016) 60 capsule 1  . cyanocobalamin (,VITAMIN B-12,) 1000 MCG/ML injection Inject 1 mL (1,000 mcg total) into the muscle once. (Patient not taking: Reported on 05/17/2015) 1 mL 3  . docusate sodium (COLACE) 100 MG capsule Take 1 capsule (100 mg total) by mouth every 12 (twelve) hours. 60 capsule 0  . HYDROmorphone (DILAUDID) 4 MG tablet Take 1 tablet (4 mg total) by mouth every 4 (four) hours as needed for severe pain. 30 tablet 0  . metoprolol succinate (TOPROL-XL) 50 MG 24 hr tablet TAKE 1 TABLET (50 MG TOTAL) BY MOUTH DAILY. (Patient not taking: Reported on 12/08/2016) 30 tablet 5  . polyethylene glycol (MIRALAX / GLYCOLAX) packet Take 17 g by mouth daily. 14 each 0  . tamsulosin (FLOMAX) 0.4 MG CAPS capsule Take 1 capsule (0.4 mg total) by mouth daily. 30 capsule 0  . vitamin C (ASCORBIC ACID) 500 MG tablet Take 1 tablet (500 mg total) by mouth daily. 90 tablet 0    No results found for this or any previous visit (from the past 48 hour(s)). No results found.  Review of Systems  All other systems reviewed and are negative.   Blood pressure 139/71, pulse (!) 56, temperature 98 F (36.7 C), temperature source Oral, resp. rate 18, height 5\' 11"  (1.803 m), weight 222 lb 5 oz (100.8 kg), SpO2 94 %. Physical Exam  Constitutional: He is oriented to person, place, and time. He appears well-developed and well-nourished.  HENT:  Head: Normocephalic and atraumatic.  Eyes: EOM are normal. Pupils are equal, round, and reactive to light.  Neck: Normal range of motion. Neck supple.  Cardiovascular: Normal rate and regular rhythm.   Respiratory: Effort normal and breath sounds normal.  GI: Soft. Bowel sounds are normal.  Musculoskeletal:       Left knee: He exhibits decreased range of motion, swelling, effusion and ecchymosis. Tenderness found. Lateral joint line  tenderness noted.  Neurological: He is alert and oriented to person, place, and time.  Skin: Skin is warm and dry.  Psychiatric: He has a normal mood and affect.     Assessment/Plan Displaced left knee tibial plateau fracture 1)  Due to the unstable nature of this fracture, surgery has ben recommended.  This will involve open reduction/internal fixation of his tibial plateau using a plate and screws.  A detailed discussion of surgery as well as the risks and benefits involved has been had.  Mcarthur Rossetti, MD 12/11/2016, 2:38 PM

## 2016-12-11 NOTE — Progress Notes (Signed)
Albuterol neb given per Dr Marcell Barlow.

## 2016-12-11 NOTE — Transfer of Care (Signed)
Immediate Anesthesia Transfer of Care Note  Patient: Harold Robinson  Procedure(s) Performed: Procedure(s): OPEN REDUCTION INTERNAL FIXATION (ORIF) LEFT TIBIAL PLATEAU FRACTURE (Left)  Patient Location: PACU  Anesthesia Type:General  Level of Consciousness:  sedated, patient cooperative and responds to stimulation  Airway & Oxygen Therapy:Patient Spontanous Breathing and Patient connected to face mask oxgen  Post-op Assessment:  Report given to PACU RN and Post -op Vital signs reviewed and stable  Post vital signs:  Reviewed and stable  Last Vitals:  Vitals:   12/11/16 1545 12/11/16 1600  BP:    Pulse: (!) 55 (!) 49  Resp:    Temp:      Complications: No apparent anesthesia complications

## 2016-12-11 NOTE — Anesthesia Preprocedure Evaluation (Signed)
Anesthesia Evaluation  Patient identified by MRN, date of birth, ID band Patient awake    Reviewed: Allergy & Precautions, NPO status , Patient's Chart, lab work & pertinent test results  Airway Mallampati: II  TM Distance: >3 FB Neck ROM: Full    Dental no notable dental hx.    Pulmonary neg pulmonary ROS,    Pulmonary exam normal breath sounds clear to auscultation       Cardiovascular hypertension, Pt. on medications Normal cardiovascular exam Rhythm:Regular Rate:Normal     Neuro/Psych negative neurological ROS  negative psych ROS   GI/Hepatic Neg liver ROS, GERD  Controlled,  Endo/Other  negative endocrine ROS  Renal/GU negative Renal ROS  negative genitourinary   Musculoskeletal negative musculoskeletal ROS (+)   Abdominal   Peds negative pediatric ROS (+)  Hematology negative hematology ROS (+)   Anesthesia Other Findings   Reproductive/Obstetrics negative OB ROS                             Anesthesia Physical Anesthesia Plan  ASA: II  Anesthesia Plan: General   Post-op Pain Management:    Induction: Intravenous  Airway Management Planned: LMA  Additional Equipment:   Intra-op Plan:   Post-operative Plan: Extubation in OR  Informed Consent: I have reviewed the patients History and Physical, chart, labs and discussed the procedure including the risks, benefits and alternatives for the proposed anesthesia with the patient or authorized representative who has indicated his/her understanding and acceptance.   Dental advisory given  Plan Discussed with: CRNA  Anesthesia Plan Comments:         Anesthesia Quick Evaluation

## 2016-12-11 NOTE — Anesthesia Procedure Notes (Signed)
Procedure Name: LMA Insertion Performed by: Nico Rogness J Pre-anesthesia Checklist: Patient identified, Emergency Drugs available, Suction available, Patient being monitored and Timeout performed Patient Re-evaluated:Patient Re-evaluated prior to inductionOxygen Delivery Method: Circle system utilized Preoxygenation: Pre-oxygenation with 100% oxygen Intubation Type: IV induction Ventilation: Mask ventilation without difficulty LMA: LMA inserted LMA Size: 4.0 Number of attempts: 1 Placement Confirmation: positive ETCO2,  CO2 detector and breath sounds checked- equal and bilateral Tube secured with: Tape Dental Injury: Teeth and Oropharynx as per pre-operative assessment        

## 2016-12-11 NOTE — Brief Op Note (Signed)
12/11/2016  6:26 PM  PATIENT:  Harold Robinson  46 y.o. male  PRE-OPERATIVE DIAGNOSIS:  left tibial plateau fracture  POST-OPERATIVE DIAGNOSIS:  left tibial plateau fracture  PROCEDURE:  Procedure(s): OPEN REDUCTION INTERNAL FIXATION (ORIF) LEFT TIBIAL PLATEAU FRACTURE (Left)  SURGEON:  Surgeon(s) and Role:    * Mcarthur Rossetti, MD - Primary  PHYSICIAN ASSISTANT: Benita Stabile, PA-C  ANESTHESIA:   general  EBL:  Total I/O In: 2000 [I.V.:2000] Out: -   COUNTS:  YES  TOURNIQUET:  * Missing tourniquet times found for documented tourniquets in log:  [id number] *  DICTATION: .Other Dictation: Dictation Number [id number]  PLAN OF CARE: Admit to inpatient   PATIENT DISPOSITION:  PACU - hemodynamically stable.   Delay start of Pharmacological VTE agent (>24hrs) due to surgical blood loss or risk of bleeding: no

## 2016-12-12 MED ORDER — HYDROMORPHONE HCL 1 MG/ML IJ SOLN
1.0000 mg | INTRAMUSCULAR | Status: DC | PRN
  Administered 2016-12-12 (×2): 2 mg via INTRAVENOUS
  Administered 2016-12-12: 1 mg via INTRAVENOUS
  Administered 2016-12-12 – 2016-12-13 (×3): 2 mg via INTRAVENOUS
  Filled 2016-12-12 (×6): qty 2

## 2016-12-12 MED ORDER — KETOROLAC TROMETHAMINE 15 MG/ML IJ SOLN
30.0000 mg | Freq: Four times a day (QID) | INTRAMUSCULAR | Status: AC
  Administered 2016-12-12 (×3): 30 mg via INTRAVENOUS
  Filled 2016-12-12 (×3): qty 2

## 2016-12-12 MED ORDER — KETOROLAC TROMETHAMINE 30 MG/ML IJ SOLN
22.5000 mg | Freq: Once | INTRAMUSCULAR | Status: AC
  Administered 2016-12-12: 22.5 mg via INTRAVENOUS
  Filled 2016-12-12: qty 1

## 2016-12-12 MED ORDER — LIP MEDEX EX OINT
TOPICAL_OINTMENT | CUTANEOUS | Status: DC | PRN
  Administered 2016-12-12: 1 via TOPICAL
  Filled 2016-12-12: qty 7

## 2016-12-12 NOTE — Op Note (Signed)
Harold Robinson, Harold Robinson               ACCOUNT NO.:  [id number]  MEDICAL RECORD NO.:  [id number]  LOCATION:                                 FACILITY:  PHYSICIAN:  Lind Guest. Ninfa Linden, M.D.DATE OF BIRTH:  January 31, 1971  DATE OF PROCEDURE:  12/11/2016 DATE OF DISCHARGE:                              OPERATIVE REPORT   PREOPERATIVE DIAGNOSIS:  Left knee complex comminuted lateral tibial plateau fracture.  POSTOPERATIVE DIAGNOSIS:  Left knee complex comminuted lateral tibial plateau fracture.  PROCEDURE:  Open reduction and internal fixation of left knee lateral tibial plateau fracture using plates and screws.  IMPLANTS:  Biomet left lateral proximal tibial locking plate.  SURGEON:  Lind Guest. Ninfa Linden, M.D.  ASSISTANT:  Erskine Emery, PA-C.  ANESTHESIA:  General.  BLOOD LOSS:  Minimal.  TOURNIQUET TIME:  Less than an hour and half.  COMPLICATIONS:  None.  INDICATIONS:  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  His soft tissue swelling subsides for the last 6 days and is presenting today for definitive fixation of the fracture. The risks and benefits of this have been explained to him in detail and he does wish to proceed.  PROCEDURE DESCRIPTION:  After informed consent was obtained, appropriate left knee was marked.  He was brought to the operating room, placed supine on the operating table, and general anesthesia was then obtained. A nonsterile tourniquet was placed around his upper left thigh and his left leg were prepped and draped from the thigh down to the ankle with DuraPrep, sterile drapes, sterile stockinette.  Time-out was called to identify correct patient, correct left knee.  We then used an Esmarch to wrap out the leg, and tourniquet was inflated to 300 mm of pressure.  We then made  a curvilinear incision on the lateral aspect of his knee and carried this proximally and distally.  I dissected down to the lateral tibial plateau, and we were able to find a little window in the bone under direct fluoroscopic guidance.  We were able to tamp up the joint line and back filled this with cancellous bone chips.  I then chose a Biomet periarticular locking plate and we chose to place this along the lateral cortex of the tibia and verified, placed under direct fluoroscopy.  We then secured it with a combination of locking screws proximally and bicortical screws distally.  We were pleased with the stability of the fracture and its alignment under direct fluoroscopy afterwards.  We then irrigated the soft tissue with normal saline solution.  We closed the deep tissue over the plate with interrupted #1 Vicryl suture followed by 0 Vicryl in the deep tissue, 2-0 Vicryl in the subcutaneous tissue, and interrupted 2-0 nylon on the skin.  Xeroform and well-padded sterile dressing was applied.  He was awakened, extubated, and taken to the recovery room in stable condition.  All final counts were correct.  There were no complications noted.  Of note, Erskine Emery, PA-C assisted in  the entire case.  His assistance was crucial for facilitating all aspects of this case.     Lind Guest. Ninfa Linden, M.D.   ______________________________ Lind Guest. Ninfa Linden, M.D.    CYB/MEDQ  D:  12/11/2016  T:  12/12/2016  Job:  [id number]

## 2016-12-12 NOTE — Anesthesia Postprocedure Evaluation (Signed)
Anesthesia Post Note  Patient: Harold Robinson  Procedure(s) Performed: Procedure(s) (LRB): OPEN REDUCTION INTERNAL FIXATION (ORIF) LEFT TIBIAL PLATEAU FRACTURE (Left)  Patient location during evaluation: PACU Anesthesia Type: General Level of consciousness: awake and alert Pain management: pain level controlled Vital Signs Assessment: post-procedure vital signs reviewed and stable Respiratory status: spontaneous breathing, nonlabored ventilation, respiratory function stable and patient connected to nasal cannula oxygen Cardiovascular status: blood pressure returned to baseline and stable Postop Assessment: no signs of nausea or vomiting Anesthetic complications: no       Last Vitals:  Vitals:   12/12/16 0007 12/12/16 0135  BP: 128/83 (!) 145/74  Pulse: 62 61  Resp: 20 20  Temp: 36.6 C 36.6 C    Last Pain:  Vitals:   12/12/16 0219  TempSrc:   PainSc: 1                  Montez Hageman

## 2016-12-12 NOTE — Progress Notes (Signed)
Physical Therapy Treatment Patient Details Name: Harold Robinson MRN: [id number] DOB: 04/02/1971 Today's Date: 12/12/2016    History of Present Illness Pt s/p L tibial plateau fx and ORIF repair    PT Comments    Pt progressing with mobility.  Consider stairs tomorrow.  Follow Up Recommendations  No PT follow up     Equipment Recommendations  None recommended by PT    Recommendations for Other Services OT consult     Precautions / Restrictions Precautions Precautions: Fall Restrictions Weight Bearing Restrictions: Yes LLE Weight Bearing: Non weight bearing    Mobility  Bed Mobility Overal bed mobility: Needs Assistance Bed Mobility: Supine to Sit;Sit to Supine     Supine to sit: Min assist Sit to supine: Min assist   General bed mobility comments: cues for sequence and min assist to manage L LE  Transfers Overall transfer level: Needs assistance Equipment used: Rolling walker (2 wheeled) Transfers: Sit to/from Stand Sit to Stand: Min guard         General transfer comment: cues for LE management and use of UEs to self assist  Ambulation/Gait Ambulation/Gait assistance: Min assist Ambulation Distance (Feet): 68 Feet (twice) Assistive device: Rolling walker (2 wheeled) Gait Pattern/deviations: Step-to pattern;Decreased step length - right;Decreased step length - left;Shuffle;Trunk flexed Gait velocity: decr Gait velocity interpretation: Below normal speed for age/gender General Gait Details: cues for posture, stride length and position from RW. Pt doing well maintaining NWB on L LE    Stairs            Wheelchair Mobility    Modified Rankin (Stroke Patients Only)       Balance Overall balance assessment: No apparent balance deficits (not formally assessed)                                  Cognition Arousal/Alertness: Awake/alert Behavior During Therapy: WFL for tasks assessed/performed Overall Cognitive Status: Within  Functional Limits for tasks assessed                      Exercises      General Comments        Pertinent Vitals/Pain Pain Assessment: 0-10 Pain Score: 8  Pain Location: L knee Pain Descriptors / Indicators: Sore Pain Intervention(s): Limited activity within patient's tolerance;Monitored during session;Premedicated before session (Offered ice packs and contact RN for meds, pt declines)    Home Living                      Prior Function            PT Goals (current goals can now be found in the care plan section) Acute Rehab PT Goals Patient Stated Goal: Regain IND PT Goal Formulation: With patient Time For Goal Achievement: 12/14/16 Potential to Achieve Goals: Good Progress towards PT goals: Progressing toward goals    Frequency    Min 6X/week      PT Plan Current plan remains appropriate    Co-evaluation             End of Session Equipment Utilized During Treatment: Gait belt Activity Tolerance: Patient tolerated treatment well Patient left: in bed;with call bell/phone within reach;with family/visitor present     Time: [id number] PT Time Calculation (min) (ACUTE ONLY): 20 min  Charges:  $Gait Training: 8-22 mins  G Codes:      Derrill Bagnell 2017/01/07, 4:44 PM

## 2016-12-12 NOTE — Op Note (Deleted)
  The note originally documented on this encounter has been moved the the encounter in which it belongs.  

## 2016-12-12 NOTE — Progress Notes (Signed)
Subjective: 1 Day Post-Op Procedure(s) (LRB): OPEN REDUCTION INTERNAL FIXATION (ORIF) LEFT TIBIAL PLATEAU FRACTURE (Left) Patient reports pain as moderate.    Objective: Vital signs in last 24 hours: Temp:  [97.8 F (36.6 C)-99 F (37.2 C)] 98.3 F (36.8 C) (01/06 1022) Pulse Rate:  [49-77] 61 (01/06 1022) Resp:  [12-21] 16 (01/06 1022) BP: (116-176)/(64-111) 116/64 (01/06 1022) SpO2:  [90 %-100 %] 92 % (01/06 1221) Weight:  [222 lb 5 oz (100.8 kg)] 222 lb 5 oz (100.8 kg) (01/05 1407)  Intake/Output from previous day: 01/05 0701 - 01/06 0700 In: 3670 [P.O.:360; I.V.:3255; IV Piggyback:55] Out: 550 [Urine:500; Blood:50] Intake/Output this shift: Total I/O In: 360 [P.O.:360] Out: 150 [Urine:150]  No results for input(s): HGB in the last 72 hours. No results for input(s): WBC, RBC, HCT, PLT in the last 72 hours. No results for input(s): NA, K, CL, CO2, BUN, CREATININE, GLUCOSE, CALCIUM in the last 72 hours. No results for input(s): LABPT, INR in the last 72 hours.  Sensation intact distally Intact pulses distally Dorsiflexion/Plantar flexion intact Incision: dressing C/D/I No cellulitis present Compartment soft  Assessment/Plan: 1 Day Post-Op Procedure(s) (LRB): OPEN REDUCTION INTERNAL FIXATION (ORIF) LEFT TIBIAL PLATEAU FRACTURE (Left) Up with therapy Discharge home with home health likely Monday  Mcarthur Rossetti 12/12/2016, 12:25 PM

## 2016-12-12 NOTE — Discharge Instructions (Signed)
.  Increase your activities as comfort allows. No weight at all on your left leg. Bend your left knee as pain allows. Do pump your feet occasionally through out the day. You can get your actual incision wet daily in the shower.

## 2016-12-12 NOTE — Progress Notes (Signed)
Pt not having much pain relief with medication given. Notified Dr. Sharol Given, on call for Dr. Ninfa Linden, who increased Toradol to 30mg  IV every 6 hours for four doses and he was okay with changing IV Dilaudid to 1-2mg  every 2 hours as needed for pain. Will continue to monitor.

## 2016-12-12 NOTE — Evaluation (Signed)
Physical Therapy Evaluation Patient Details Name: Harold Robinson MRN: [id number] DOB: November 23, 1971 Today's Date: 12/12/2016   History of Present Illness  Pt s/p L tibial plateau fx and ORIF repair  Clinical Impression  Pt admitted as above and presenting with functional mobility limitations 2* pain and NWB status L LE.  Pt should progress to dc home with assist of family.    Follow Up Recommendations No PT follow up    Equipment Recommendations  None recommended by PT    Recommendations for Other Services OT consult     Precautions / Restrictions Precautions Precautions: Fall Restrictions Weight Bearing Restrictions: Yes LLE Weight Bearing: Non weight bearing      Mobility  Bed Mobility Overal bed mobility: Needs Assistance Bed Mobility: Supine to Sit     Supine to sit: Min assist     General bed mobility comments: cues for sequence and min assist to manage L LE  Transfers Overall transfer level: Needs assistance Equipment used: Rolling walker (2 wheeled) Transfers: Sit to/from Stand Sit to Stand: Min assist         General transfer comment: cues for LE management and use of UEs to self assist  Ambulation/Gait Ambulation/Gait assistance: Min assist Ambulation Distance (Feet): 68 Feet Assistive device: Rolling walker (2 wheeled) Gait Pattern/deviations: Step-to pattern;Decreased step length - right;Decreased step length - left;Shuffle;Trunk flexed Gait velocity: decr Gait velocity interpretation: Below normal speed for age/gender General Gait Details: cues for posture, stride length and position from RW.    Stairs            Wheelchair Mobility    Modified Rankin (Stroke Patients Only)       Balance Overall balance assessment: No apparent balance deficits (not formally assessed)                                           Pertinent Vitals/Pain Pain Assessment: 0-10 Pain Score: 3  Pain Location: L knee Pain Descriptors /  Indicators: Sore Pain Intervention(s): Limited activity within patient's tolerance;Monitored during session;Premedicated before session;Ice applied    Home Living Family/patient expects to be discharged to:: Private residence Living Arrangements: Spouse/significant other Available Help at Discharge: Family Type of Home: House Home Access: Stairs to enter Entrance Stairs-Rails: Psychiatric nurse of Steps: 3 Home Layout: Able to live on main level with bedroom/bathroom Home Equipment: Crutches Additional Comments: Wife states can borrow RW from aunt    Prior Function Level of Independence: Independent               Hand Dominance        Extremity/Trunk Assessment   Upper Extremity Assessment Upper Extremity Assessment: Overall WFL for tasks assessed    Lower Extremity Assessment Lower Extremity Assessment: LLE deficits/detail    Cervical / Trunk Assessment Cervical / Trunk Assessment: Normal  Communication   Communication: No difficulties  Cognition Arousal/Alertness: Awake/alert Behavior During Therapy: WFL for tasks assessed/performed Overall Cognitive Status: Within Functional Limits for tasks assessed                      General Comments      Exercises     Assessment/Plan    PT Assessment Patient needs continued PT services  PT Problem List Decreased strength;Decreased range of motion;Decreased activity tolerance;Decreased mobility;Decreased knowledge of use of DME;Obesity;Pain  PT Treatment Interventions DME instruction;Gait training;Stair training;Functional mobility training;Therapeutic activities;Therapeutic exercise;Patient/family education    PT Goals (Current goals can be found in the Care Plan section)  Acute Rehab PT Goals Patient Stated Goal: Regain IND PT Goal Formulation: With patient Time For Goal Achievement: 12/14/16 Potential to Achieve Goals: Good    Frequency Min 6X/week   Barriers to  discharge        Co-evaluation               End of Session Equipment Utilized During Treatment: Gait belt Activity Tolerance: Patient tolerated treatment well Patient left: in chair;with call bell/phone within reach;with family/visitor present Nurse Communication: Mobility status         Time: 1130-1157 PT Time Calculation (min) (ACUTE ONLY): 27 min   Charges:   PT Evaluation $PT Eval Low Complexity: 1 Procedure PT Treatments $Gait Training: 8-22 mins   PT G Codes:        Dinora Hemm Jan 11, 2017, 12:43 PM

## 2016-12-13 NOTE — Progress Notes (Signed)
Physical Therapy Treatment Patient Details Name: OTHMAN COCCHIOLA MRN: [id number] DOB: [birth year] Today's Date: 12/13/2016    History of Present Illness Pt s/p L tibial plateau fx and ORIF repair    PT Comments    Pt continues motivated and progressing steadily with mobility but struggling with on/off bed.  Follow Up Recommendations  No PT follow up     Equipment Recommendations  None recommended by PT    Recommendations for Other Services OT consult     Precautions / Restrictions Precautions Precautions: Fall Precaution Comments: Per Dr Ninfa Linden verbally, KI not required and pt can move knee to comfort level Restrictions Weight Bearing Restrictions: Yes LLE Weight Bearing: Non weight bearing    Mobility  Bed Mobility Overal bed mobility: Needs Assistance Bed Mobility: Supine to Sit;Sit to Supine     Supine to sit: Min assist Sit to supine: Min assist   General bed mobility comments: cues for sequence and min assist to manage L LE  Transfers Overall transfer level: Needs assistance Equipment used: Rolling walker (2 wheeled) Transfers: Sit to/from Stand Sit to Stand: Min guard         General transfer comment: cues for LE management and use of UEs to self assist  Ambulation/Gait Ambulation/Gait assistance: Min assist;Min guard Ambulation Distance (Feet): 130 Feet Assistive device: Rolling walker (2 wheeled) Gait Pattern/deviations: Step-to pattern;Decreased step length - right;Decreased step length - left;Shuffle;Trunk flexed Gait velocity: decr Gait velocity interpretation: Below normal speed for age/gender General Gait Details: Multiple short standing rests breaks and cues for posture, stride length and position from RW. Pt doing well maintaining NWB on L LE    Stairs            Wheelchair Mobility    Modified Rankin (Stroke Patients Only)       Balance Overall balance assessment: No apparent balance deficits (not formally assessed)                                   Cognition Arousal/Alertness: Awake/alert Behavior During Therapy: WFL for tasks assessed/performed Overall Cognitive Status: Within Functional Limits for tasks assessed                      Exercises General Exercises - Lower Extremity Ankle Circles/Pumps: AROM;Both;15 reps;Supine (Reviewed need to maintain knee ext during recovery)    General Comments        Pertinent Vitals/Pain Pain Assessment: 0-10 Pain Score: 4  Pain Location: L knee Pain Descriptors / Indicators: Sore Pain Intervention(s): Limited activity within patient's tolerance;Monitored during session;Premedicated before session;Ice applied    Home Living                      Prior Function            PT Goals (current goals can now be found in the care plan section) Acute Rehab PT Goals Patient Stated Goal: Regain IND PT Goal Formulation: With patient Time For Goal Achievement: 12/14/16 Potential to Achieve Goals: Good Progress towards PT goals: Progressing toward goals    Frequency    Min 6X/week      PT Plan Current plan remains appropriate    Co-evaluation             End of Session Equipment Utilized During Treatment: Gait belt Activity Tolerance: Patient tolerated treatment well Patient left: in bed;with call bell/phone within reach;with family/visitor  present     Time: 1012-1045 PT Time Calculation (min) (ACUTE ONLY): 33 min  Charges:  $Gait Training: 23-37 mins                    G Codes:      Reniyah Gootee 12-25-16, 12:16 PM

## 2016-12-13 NOTE — Progress Notes (Signed)
Patient ID: Harold Robinson, male   DOB: Feb 26, 1971, 46 y.o.   MRN: [id number] Patient making slow progress with therapy. Examination the dressing is clean and dry there is no redness or cellulitis on the skin. Continue physical therapy possible discharge to home tomorrow.

## 2016-12-14 ENCOUNTER — Encounter (HOSPITAL_COMMUNITY): Payer: Self-pay | Admitting: Orthopaedic Surgery

## 2016-12-14 MED ORDER — METHOCARBAMOL 500 MG PO TABS: 500.0000 mg | ORAL_TABLET | Freq: Four times a day (QID) | ORAL | 0 refills | Status: DC | PRN

## 2016-12-14 MED ORDER — OXYCODONE-ACETAMINOPHEN 5-325 MG PO TABS: 1.0000 | ORAL_TABLET | ORAL | 0 refills | Status: DC | PRN

## 2016-12-14 NOTE — Progress Notes (Signed)
Pt to d/c home. No DME or PT follow-up needs. AVS reviewed and "My Chart" discussed with pt. Pt capable of verbalizing medications, signs and symptoms of infection, and follow-up appointments. Remains hemodynamically stable. No signs and symptoms of distress. Educated pt to return to ER in the case of SOB, dizziness, or chest pain.

## 2016-12-14 NOTE — Discharge Summary (Signed)
Patient ID: MAI NICOLICH MRN: [id number] DOB/AGE: 05/06/1971 46 y.o.  Admit date: 12/11/2016 Discharge date: 12/14/2016  Admission Diagnoses:  Principal Problem:   Tibial plateau fracture, left, closed, initial encounter Active Problems:   Closed fracture of left tibial plateau   Discharge Diagnoses:  Same  Past Medical History:  Diagnosis Date  . Anxiety   . Arthritis    neck  . 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 Esophagitis   . Hiatal hernia   . Hypertension   . Neck pain   . Pleurisy     Surgeries: Procedure(s): OPEN REDUCTION INTERNAL FIXATION (ORIF) LEFT TIBIAL PLATEAU FRACTURE on 12/11/2016   Consultants:   Discharged Condition: Improved  Hospital Course: RAJU ROUTH is an 46 y.o. male who was admitted 12/11/2016 for operative treatment ofTibial plateau fracture, left, closed, initial encounter. Patient has severe unremitting pain that affects sleep, daily activities, and work/hobbies. After pre-op clearance the patient was taken to the operating room on 12/11/2016 and underwent  Procedure(s): OPEN REDUCTION INTERNAL FIXATION (ORIF) LEFT TIBIAL PLATEAU FRACTURE.    Patient was given perioperative antibiotics: Anti-infectives    Start     Dose/Rate Route Frequency Ordered Stop   12/12/16 0600  ceFAZolin (ANCEF) IVPB 2g/100 mL premix     2 g 200 mL/hr over 30 Minutes Intravenous On call to O.R. 12/11/16 1402 12/11/16 1656   12/11/16 2300  ceFAZolin (ANCEF) IVPB 1 g/50 mL premix     1 g 100 mL/hr over 30 Minutes Intravenous Every 6 hours 12/11/16 2051 12/12/16 1147       Patient was given sequential compression devices, early ambulation, and chemoprophylaxis to prevent DVT.  Patient benefited maximally from hospital stay and there were no complications.    Recent vital signs: Patient Vitals for the past 24 hrs:  BP Temp Temp src Pulse Resp SpO2  12/14/16 0528 (!) 144/74 99.7 F (37.6 C) Oral 64 20 92 %  12/13/16 2120 133/66 99.4 F  (37.4 C) Oral 68 20 93 %  12/13/16 1456 136/62 98.6 F (37 C) Oral (!) 57 19 90 %     Recent laboratory studies: No results for input(s): WBC, HGB, HCT, PLT, NA, K, CL, CO2, BUN, CREATININE, GLUCOSE, INR, CALCIUM in the last 72 hours.  Invalid input(s): PT, 2   Discharge Medications:   Allergies as of 12/14/2016   No Known Allergies     Medication List    STOP taking these medications   HYDROmorphone 4 MG tablet Commonly known as:  DILAUDID     TAKE these medications   albuterol 108 (90 Base) MCG/ACT inhaler Commonly known as:  PROVENTIL HFA;VENTOLIN HFA Inhale 2 puffs into the lungs every 4 (four) hours as needed for wheezing.   celecoxib 200 MG capsule Commonly known as:  CELEBREX Take 1 capsule (200 mg total) by mouth daily.   clidinium-chlordiazePOXIDE 5-2.5 MG capsule Commonly known as:  LIBRAX Take one capsule by mouth every 6-8 hours for abdominal cramping   cyanocobalamin 1000 MCG/ML injection Commonly known as:  (VITAMIN B-12) Inject 1 mL (1,000 mcg total) into the muscle once.   docusate sodium 100 MG capsule Commonly known as:  COLACE Take 100 mg by mouth daily.   docusate sodium 100 MG capsule Commonly known as:  COLACE Take 1 capsule (100 mg total) by mouth every 12 (twelve) hours.   methocarbamol 500 MG tablet Commonly known as:  ROBAXIN Take 1 tablet (500 mg total) by  mouth every 6 (six) hours as needed for muscle spasms.   metoprolol succinate 50 MG 24 hr tablet Commonly known as:  TOPROL-XL TAKE 1 TABLET (50 MG TOTAL) BY MOUTH DAILY.   metoprolol succinate 50 MG 24 hr tablet Commonly known as:  TOPROL-XL TAKE 1 TABLET (50 MG TOTAL) BY MOUTH DAILY.   oxyCODONE-acetaminophen 5-325 MG tablet Commonly known as:  ROXICET Take 1-2 tablets by mouth every 4 (four) hours as needed.   polyethylene glycol packet Commonly known as:  MIRALAX / GLYCOLAX Take 17 g by mouth daily.   sertraline 50 MG tablet Commonly known as:  ZOLOFT TAKE 1 & 1/2  TABLETS BY MOUTH DAILY What changed:  See the new instructions.   tamsulosin 0.4 MG Caps capsule Commonly known as:  FLOMAX Take 1 capsule (0.4 mg total) by mouth daily.   vitamin C 500 MG tablet Commonly known as:  ASCORBIC ACID Take 1 tablet (500 mg total) by mouth daily.       Diagnostic Studies: Dg Ankle Complete Left  Result Date: 12/05/2016 CLINICAL DATA:  Patient status post heavy machine falling on him with left ankle pain. EXAM: LEFT ANKLE COMPLETE - 3+ VIEW COMPARISON:  None. FINDINGS: There is radiolucency projected over the metatarsals on the lateral view only. There is no acute fracture dislocation within the left ankle. IMPRESSION: Linear radiolucency projected over the metatarsals on the lateral view only. Further evaluation with left foot film is recommended. Electronically Signed   By: Abelardo Diesel M.D.   On: 12/05/2016 16:56   Ct Knee Left Wo Contrast  Result Date: 12/05/2016 CLINICAL DATA:  Hay loader fell on patient.  Left knee pain. EXAM: CT OF THE  KNEE WITHOUT CONTRAST TECHNIQUE: Multidetector CT imaging of the knee was performed according to the standard protocol. Multiplanar CT image reconstructions were also generated. COMPARISON:  None. FINDINGS: Bones/Joint/Cartilage Acute, closed, lateral tibial plateau fracture along the anterior half with 10 mm of depression noted along the posterior central aspect of the fracture. Fracture at the base of the medial tibial spine with minimal comminution. Suprapatellar lipohemarthrosis. Ligaments Suboptimally assessed by CT. ACL appears somewhat thickened but appears to maintain its orientation. ACL sprain not entirely excluded. PCL appears intact. MCL appears intact. Mild thickening and edema along the course of the LCL. Muscles and Tendons No intramuscular hematoma.  Intact extensor mechanism. Soft tissues Periarticular soft tissue swelling anteriorly. IMPRESSION: Acute, lateral tibial plateau fracture with up to 10 mm of  depression noted centrally and posteriorly. Associated lipohemarthrosis. Ligaments are suboptimally assessed by CT. The ACL and LCL appear somewhat thickened and sprains are not entirely excluded. Electronically Signed   By: Ashley Royalty M.D.   On: 12/05/2016 18:21   Dg Knee Complete 4 Views Left  Result Date: 12/11/2016 CLINICAL DATA:  Left tibial fracture which the patient suffered on 12/05/2016 when a hay loader fell onto his left knee. Initial encounter. EXAM: LEFT KNEE - COMPLETE 4+ VIEW; DG C-ARM 1-60 MIN-NO REPORT COMPARISON:  CT left knee 12/05/2016. FINDINGS: We are provided with 3 fluoroscopic intraoperative spot views of the left knee. Images demonstrate lateral plate and screws in place for fixation of a depressed lateral tibial plateau fracture. Position and alignment are markedly improved. No acute abnormality. Hardware is intact. IMPRESSION: ORIF lateral tibial plateau fracture.  No acute finding. Electronically Signed   By: Inge Rise M.D.   On: 12/11/2016 19:50   Dg Knee Complete 4 Views Left  Result Date: 12/05/2016 CLINICAL DATA:  Patient's working on a heavy machine with the machine falling on his hip knee and ankle today. EXAM: LEFT KNEE - COMPLETE 4+ VIEW COMPARISON:  None. FINDINGS: There is intra-articular displaced fracture of the lateral tibial plateau. Fluid fluid level is identified within the suprapatellar space. IMPRESSION: Fracture of proximal tibia. Electronically Signed   By: Abelardo Diesel M.D.   On: 12/05/2016 16:53   Dg Foot Complete Left  Result Date: 12/05/2016 CLINICAL DATA:  Left foot pain after injury. EXAM: LEFT FOOT - COMPLETE 3+ VIEW COMPARISON:  None. FINDINGS: There is no evidence of fracture or dislocation. There is no evidence of arthropathy or other focal bone abnormality. Soft tissues are unremarkable. IMPRESSION: Normal left foot. Electronically Signed   By: Marijo Conception, M.D.   On: 12/05/2016 18:16   Dg C-arm 1-60 Min-no Report  Result Date:  12/11/2016 There is no Radiologist interpretation  for this exam.  Dg Hip Unilat W Or Wo Pelvis 2-3 Views Left  Result Date: 12/05/2016 CLINICAL DATA:  Patient was working on heavy machinery today with the machine falling on hand complaining of left hip, knee and ankle pain. EXAM: DG HIP (WITH OR WITHOUT PELVIS) 2-3V LEFT COMPARISON:  None. FINDINGS: There is no evidence of hip fracture or dislocation. There is no evidence of arthropathy or other focal bone abnormality. IMPRESSION: Negative. Electronically Signed   By: Abelardo Diesel M.D.   On: 12/05/2016 16:53    Disposition: 01-Home or Self Care  Discharge Instructions    Call MD / Call 911    Complete by:  As directed    If you experience chest pain or shortness of breath, CALL 911 and be transported to the hospital emergency room.  If you develope a fever above 101 F, pus (white drainage) or increased drainage or redness at the wound, or calf pain, call your surgeon's office.   Constipation Prevention    Complete by:  As directed    Drink plenty of fluids.  Prune juice may be helpful.  You may use a stool softener, such as Colace (over the counter) 100 mg twice a day.  Use MiraLax (over the counter) for constipation as needed.   Diet - low sodium heart healthy    Complete by:  As directed    Discharge patient    Complete by:  As directed    Discharge disposition:  01-Home or Self Care   Discharge patient date:  12/14/2016   Increase activity slowly as tolerated    Complete by:  As directed       Follow-up Information    Mcarthur Rossetti, MD. Schedule an appointment as soon as possible for a visit in 2 week(s).   Specialty:  Orthopedic Surgery Contact information: [place] [address] [phone]            Signed: Mcarthur Rossetti 12/14/2016, 7:24 AM

## 2016-12-14 NOTE — Progress Notes (Signed)
Physical Therapy Treatment Patient Details Name: Harold Robinson MRN: [id number] DOB: 08/07/1971 Today's Date: 12/14/2016    History of Present Illness Pt s/p L tibial plateau fx and ORIF repair    PT Comments    Pt doing well, agreeable to amb but did not want to "over do it " and be too fatigued to get into the house; pt declines stair pratice, see below; pt performing self ROM L knee and verbalizes Dr. Trevor Mace recommendations  Follow Up Recommendations  No PT follow up     Equipment Recommendations  None recommended by PT    Recommendations for Other Services OT consult     Precautions / Restrictions Precautions Precautions: Fall Precaution Comments: Per Dr Ninfa Linden verbally, KI not required and pt can move knee to comfort level Restrictions Weight Bearing Restrictions: Yes LLE Weight Bearing: Non weight bearing    Mobility  Bed Mobility   Bed Mobility: Supine to Sit     Supine to sit: Supervision;Modified independent (Device/Increase time)     General bed mobility comments: pt able to self assist LLE with incr time  Transfers Overall transfer level: Needs assistance Equipment used: Rolling walker (2 wheeled) Transfers: Sit to/from Stand Sit to Stand: Supervision         General transfer comment: for safety  Ambulation/Gait Ambulation/Gait assistance: Min guard Ambulation Distance (Feet): 60 Feet Assistive device: Rolling walker (2 wheeled) Gait Pattern/deviations: Trunk flexed Gait velocity: decr   General Gait Details: occasional safety and RW positional cues, pt self corrects intermittently   Stairs Stairs:  (pt declined, verbalized technique with crutches)          Wheelchair Mobility    Modified Rankin (Stroke Patients Only)       Balance                                    Cognition Arousal/Alertness: Awake/alert Behavior During Therapy: WFL for tasks assessed/performed Overall Cognitive Status: Within  Functional Limits for tasks assessed                      Exercises      General Comments        Pertinent Vitals/Pain Pain Assessment: 0-10 Pain Score: 5  Pain Location: L knee Pain Descriptors / Indicators: Sore Pain Intervention(s): Monitored during session    Home Living                      Prior Function            PT Goals (current goals can now be found in the care plan section) Acute Rehab PT Goals Patient Stated Goal: Regain IND PT Goal Formulation: With patient Time For Goal Achievement: 12/14/16 Potential to Achieve Goals: Good Progress towards PT goals: Progressing toward goals    Frequency    Min 6X/week      PT Plan Current plan remains appropriate    Co-evaluation             End of Session   Activity Tolerance: Patient tolerated treatment well Patient left: in chair;with call bell/phone within reach;with family/visitor present     Time: [id number] PT Time Calculation (min) (ACUTE ONLY): 11 min  Charges:  $Gait Training: 8-22 mins                    G Codes:  [facility] 12/14/2016, 11:39 AM

## 2016-12-14 NOTE — Progress Notes (Signed)
Patient ID: Harold Robinson, male   DOB: 05/09/1971, 46 y.o.   MRN: [id number]  Doing well.  Left knee stable.  Vitals stable.  Can be discharged to home today.

## 2016-12-25 ENCOUNTER — Other Ambulatory Visit: Payer: Self-pay | Admitting: Family Medicine

## 2016-12-28 ENCOUNTER — Ambulatory Visit (INDEPENDENT_AMBULATORY_CARE_PROVIDER_SITE_OTHER): Payer: BC Managed Care – PPO | Admitting: Orthopaedic Surgery

## 2016-12-28 ENCOUNTER — Ambulatory Visit (INDEPENDENT_AMBULATORY_CARE_PROVIDER_SITE_OTHER): Payer: Self-pay

## 2016-12-28 DIAGNOSIS — S82142A Displaced bicondylar fracture of left tibia, initial encounter for closed fracture: Secondary | ICD-10-CM

## 2016-12-28 DIAGNOSIS — M25562 Pain in left knee: Secondary | ICD-10-CM | POA: Diagnosis not present

## 2016-12-28 MED ORDER — OXYCODONE-ACETAMINOPHEN 5-325 MG PO TABS: 1.0000 | ORAL_TABLET | Freq: Four times a day (QID) | ORAL | 0 refills | Status: DC | PRN

## 2016-12-28 NOTE — Progress Notes (Signed)
Office Visit Note   Patient: Harold Robinson           Date of Birth: 04-26-1971           MRN: [id number] Visit Date: 12/28/2016              Requested by: [name], MD [place], [place] [address] PCP: [name], MD   Assessment & Plan: Visit Diagnoses:  1. Acute pain of left knee   2. Tibial plateau fracture, left, closed, initial encounter     Plan: He can stop the knee immobilizer this standpoint. He'll work independently on range of motion of his left knee. We did refill his Percocet. We'll see him back in a month to see how is doing overall. We'll have an AP and lateral of his left knee at that visit. He'll remain nonweightbearing on that left knee until further notice.  Follow-Up Instructions: Return in about 4 weeks (around 01/25/2017).   Orders:  Orders Placed This Encounter  Procedures  . XR Knee 1-2 Views Left   Meds ordered this encounter  Medications  . oxyCODONE-acetaminophen (ROXICET) 5-325 MG tablet    Sig: Take 1-2 tablets by mouth every 6 (six) hours as needed.    Dispense:  60 tablet    Refill:  0      Procedures: No procedures performed   Clinical Data: No additional findings.   Subjective: Chief Complaint  Patient presents with  . Left Knee - Routine Post Op    Patient ambulates with crutches and knee immobilizer. In quite a bit of pain still. He is very uncomfortable in the immobilizer.   The patient is 2 weeks status post open reduction internal fixation of a left tibial plateau fracture. This is his first postoperative visit. HPI  Review of Systems   Objective: Vital Signs: There were no vitals taken for this visit.  Physical Exam  Ortho Exam Examination of his left knee shows a contact dermatitis from his dressing. The actual incision itself looks good. The sutures are removed and Steri-Strips applied. He's got limited range of motion of his knee secondary to pain and using a knee  immobilizer. Specialty Comments:  No specialty comments available.  Imaging: Xr Knee 1-2 Views Left  Result Date: 12/28/2016 2 views of the left knee shows intact hardware along the left lateral tibial plateau. The fracture line is visible in the AP plane and in the lateral. The hardware is in good position. There is no interval evidence of healing yet.    PMFS History: Patient Active Problem List   Diagnosis Date Noted  . Closed fracture of left tibial plateau 12/11/2016  . Tibial plateau fracture, left, closed, initial encounter 12/05/2016  . Vitamin B 12 deficiency 11/27/2013  . History of depression 05/26/2013  . COSTOCHONDRITIS 05/26/2010  . GERD 08/20/2009  . BACK PAIN, LUMBAR 08/20/2009  . Essential hypertension 06/04/2009  . CHEST PAIN 06/04/2009   Past Medical History:  Diagnosis Date  . Anxiety   . Arthritis    neck  . 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 Esophagitis   . Hiatal hernia   . Hypertension   . Neck pain   . Pleurisy     Family History  Problem Relation Age of Onset  . Heart disease Father   . Heart disease Paternal Aunt   . Heart disease Paternal Uncle   . Hypertension Mother   . Colon  cancer Neg Hx   . Esophageal cancer Neg Hx   . Rectal cancer Neg Hx   . Stomach cancer Neg Hx     Past Surgical History:  Procedure Laterality Date  . ORIF TIBIA PLATEAU Left 12/11/2016   Procedure: OPEN REDUCTION INTERNAL FIXATION (ORIF) LEFT TIBIAL PLATEAU FRACTURE;  Surgeon: Mcarthur Rossetti, MD;  Location: [facility];  Service: Orthopedics;  Laterality: Left;  . 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 Smoking status: Never Smoker  . Smokeless tobacco: Never Used  . Alcohol use No  . Drug use: No  . Sexual activity: Not on file

## 2017-01-27 ENCOUNTER — Ambulatory Visit (INDEPENDENT_AMBULATORY_CARE_PROVIDER_SITE_OTHER): Payer: Self-pay

## 2017-01-27 ENCOUNTER — Encounter (INDEPENDENT_AMBULATORY_CARE_PROVIDER_SITE_OTHER): Payer: Self-pay | Admitting: Orthopaedic Surgery

## 2017-01-27 ENCOUNTER — Ambulatory Visit (INDEPENDENT_AMBULATORY_CARE_PROVIDER_SITE_OTHER): Payer: BC Managed Care – PPO | Admitting: Physician Assistant

## 2017-01-27 VITALS — Ht 71.0 in | Wt 222.0 lb

## 2017-01-27 DIAGNOSIS — S82142D Displaced bicondylar fracture of left tibia, subsequent encounter for closed fracture with routine healing: Secondary | ICD-10-CM | POA: Diagnosis not present

## 2017-01-27 NOTE — Progress Notes (Signed)
Office Visit Note   Patient: Harold Robinson           Date of Birth: [birth year]           MRN: [id number] Visit Date: 01/27/2017              Requested by: [name], MD [place], [place] [address] PCP: [name], MD   Assessment & Plan: Visit Diagnoses:  1. Closed fracture of left tibial plateau with routine healing, subsequent encounter     Plan: A bearing as tolerated left leg. No jumping or running high impact activities. We'll see him back in 1 month at that time obtain AP lateral views of the left knee. Scar tissue mobilization encouraged. We'll keep him out of work in till March 26 and most likely return him to full duties at that time.  Follow-Up Instructions: Return in about 4 weeks (around 02/24/2017).   Orders:  Orders Placed This Encounter  Procedures  . XR KNEE 3 VIEW LEFT   No orders of the defined types were placed in this encounter.     Procedures: No procedures performed   Clinical Data: No additional findings.   Subjective: Chief Complaint  Patient presents with  . Left Knee - Routine Post Op    ORIF left tibial plateau fracture 12/11/16. ~6.5 weeks post op. He is currently nonweightbearing with crutches. He is not taking anything for pain.     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 He is overall doing very well he is very pleased with the results thus for. He has no complaints. Review of Systems   Objective: Vital Signs: Ht 5\' 11"  (1.803 m)   Wt 222 lb (100.7 kg)   BMI 30.96 kg/m   Physical Exam  Ortho Exam If knee has excellent range of motion the knee no effusion no abnormal warmth he is developing a slight keloid over the surgical incision site. Otherwise incision site is without any signs of infection. Calf supple nontender. Specialty Comments:  No specialty comments available.  Imaging: Xr Knee 3 View Left  Result  Date: 01/27/2017 AP and lateral views of the left knee shows the fracture site to be well healed. There is no hardware failure. The joint line appears well restored. No other fractures or bony abnormalities seen.    PMFS History: Patient Active Problem List   Diagnosis Date Noted  . Closed fracture of left tibial plateau 12/11/2016  . Tibial plateau fracture, left, closed, initial encounter 12/05/2016  . Vitamin B 12 deficiency 11/27/2013  . History of depression 05/26/2013  . COSTOCHONDRITIS 05/26/2010  . GERD 08/20/2009  . BACK PAIN, LUMBAR 08/20/2009  . Essential hypertension 06/04/2009  . CHEST PAIN 06/04/2009   Past Medical History:  Diagnosis Date  . Anxiety   . Arthritis    neck  . 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 Esophagitis   . Hiatal hernia   . Hypertension   . Neck pain   . Pleurisy     Family History  Problem Relation Age of Onset  . Heart disease Father   . Heart disease Paternal Aunt   . Heart disease Paternal Uncle   . Hypertension Mother   . Colon cancer Neg Hx   . Esophageal cancer Neg Hx   . Rectal cancer Neg Hx   . Stomach cancer Neg Hx  Past Surgical History:  Procedure Laterality Date  . ORIF TIBIA PLATEAU Left 12/11/2016   Procedure: OPEN REDUCTION INTERNAL FIXATION (ORIF) LEFT TIBIAL PLATEAU FRACTURE;  Surgeon: Mcarthur Rossetti, MD;  Location: [facility];  Service: Orthopedics;  Laterality: Left;  . 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 Smoking status: Never Smoker  . Smokeless tobacco: Never Used  . Alcohol use No  . Drug use: No  . Sexual activity: Not on file

## 2017-02-24 ENCOUNTER — Ambulatory Visit (INDEPENDENT_AMBULATORY_CARE_PROVIDER_SITE_OTHER): Payer: BC Managed Care – PPO | Admitting: Physician Assistant

## 2017-02-24 ENCOUNTER — Ambulatory Visit (INDEPENDENT_AMBULATORY_CARE_PROVIDER_SITE_OTHER): Payer: Self-pay

## 2017-02-24 DIAGNOSIS — S82142D Displaced bicondylar fracture of left tibia, subsequent encounter for closed fracture with routine healing: Secondary | ICD-10-CM

## 2017-02-24 DIAGNOSIS — M25562 Pain in left knee: Secondary | ICD-10-CM

## 2017-02-24 NOTE — Progress Notes (Signed)
Office Visit Note   Patient: Harold Robinson           Date of Birth: [birth year]           MRN: [id number] Visit Date: 02/24/2017              Requested by: [name], MD [place], [place] [id number] PCP: [name], MD   Assessment & Plan: Visit Diagnoses:  1. Acute pain of left knee   2. Closed fracture of left tibial plateau with routine healing, subsequent encounter     Plan: This point time I discussed with [name] that he may have some pain and swelling for at least next 6 months to a year. However if he develops any mechanical symptoms or any signs of infection he is to return. He'll work on Forensic scientist. We'll let him go back to work full duties as of April 2 without restrictions. Questions are encouraged and answered at length today. Open patella knee sleeve to help with overall patellar tracking and pain.   Follow-Up Instructions: Return if symptoms worsen or fail to improve.   Orders:  Orders Placed This Encounter  Procedures  . XR Knee 1-2 Views Left   No orders of the defined types were placed in this encounter.     Procedures: No procedures performed   Clinical Data: No additional findings.   Subjective: No chief complaint on file.   HPI [name] returns today 11 weeks status post left tibial plateau fracture open reduction internal fixation. He is overall trending towards improvement. He has some popping peripatellar region at times. Having no true mechanical symptoms in the otherwise. Review of Systems   Objective: Vital Signs: There were no vitals taken for this visit.  Physical Exam  Ortho Exam Left knee has full extension and full flexion. Patella does track slightly laterally. No instability valgus varus stressing of the knee. Surgical incision is well-healed. Calf supple nontender. Specialty Comments:  No specialty comments available.  Imaging: Xr Knee 1-2 Views Left  Result Date: 02/24/2017 Left knee 2  views: Tibial Plateau fractures well-healed. Overall good position alignment of knee. No acute fractures. Knee overall well preserved. No hardware failure    PMFS History: Patient Active Problem List   Diagnosis Date Noted  . Closed fracture of left tibial plateau 12/11/2016  . Tibial plateau fracture, left, closed, initial encounter 12/05/2016  . Vitamin B 12 deficiency 11/27/2013  . History of depression 05/26/2013  . COSTOCHONDRITIS 05/26/2010  . GERD 08/20/2009  . BACK PAIN, LUMBAR 08/20/2009  . Essential hypertension 06/04/2009  . CHEST PAIN 06/04/2009   Past Medical History:  Diagnosis Date  . Anxiety   . Arthritis    neck  . 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 Esophagitis   . Hiatal hernia   . Hypertension   . Neck pain   . Pleurisy     Family History  Problem Relation Age of Onset  . Heart disease Father   . Heart disease Paternal Aunt   . Heart disease Paternal Uncle   . Hypertension Mother   . Colon cancer Neg Hx   . Esophageal cancer Neg Hx   . Rectal cancer Neg Hx   . Stomach cancer Neg Hx     Past Surgical History:  Procedure Laterality Date  . ORIF TIBIA PLATEAU Left 12/11/2016   Procedure: OPEN REDUCTION INTERNAL FIXATION (ORIF) LEFT TIBIAL PLATEAU FRACTURE;  Surgeon: Harrell Gave  Kerry Fort, MD;  Location: [facility];  Service: Orthopedics;  Laterality: Left;  . WISDOM TOOTH EXTRACTION     Social History   Occupational History  . WELDER [employer]   Social History Main Topics  . Smoking status: Never Smoker  . Smokeless tobacco: Never Used  . Alcohol use No  . Drug use: No  . Sexual activity: Not on file

## 2017-08-17 ENCOUNTER — Encounter: Payer: Self-pay | Admitting: Family Medicine

## 2017-08-17 ENCOUNTER — Ambulatory Visit (INDEPENDENT_AMBULATORY_CARE_PROVIDER_SITE_OTHER): Payer: BC Managed Care – PPO | Admitting: Family Medicine

## 2017-08-17 VITALS — BP 132/80 | HR 62 | Temp 98.6°F | Ht 70.5 in | Wt 225.0 lb

## 2017-08-17 DIAGNOSIS — Z0001 Encounter for general adult medical examination with abnormal findings: Secondary | ICD-10-CM

## 2017-08-17 DIAGNOSIS — Z0000 Encounter for general adult medical examination without abnormal findings: Secondary | ICD-10-CM

## 2017-08-17 DIAGNOSIS — B351 Tinea unguium: Secondary | ICD-10-CM | POA: Diagnosis not present

## 2017-08-17 LAB — LIPID PANEL
CHOL/HDL RATIO: 6
Cholesterol: 193 mg/dL (ref 0–200)
HDL: 33.4 mg/dL — ABNORMAL LOW (ref >39.00)
LDL CALC: 136 mg/dL — AB (ref 0–99)
NonHDL: 159.93
Triglycerides: 122 mg/dL (ref 0.0–149.0)
VLDL: 24.4 mg/dL (ref 0.0–40.0)

## 2017-08-17 LAB — CBC WITH DIFFERENTIAL/PLATELET
BASOS ABS: 0 10*3/uL (ref 0.0–0.1)
Basophils Relative: 0.5 % (ref 0.0–3.0)
EOS ABS: 0.2 10*3/uL (ref 0.0–0.7)
Eosinophils Relative: 3.5 % (ref 0.0–5.0)
HEMATOCRIT: 47.4 % (ref 39.0–52.0)
Hemoglobin: 15.8 g/dL (ref 13.0–17.0)
LYMPHS PCT: 28.5 % (ref 12.0–46.0)
Lymphs Abs: 1.8 10*3/uL (ref 0.7–4.0)
MCHC: 33.4 g/dL (ref 30.0–36.0)
MCV: 87.8 fl (ref 78.0–100.0)
Monocytes Absolute: 0.5 10*3/uL (ref 0.1–1.0)
Monocytes Relative: 7.8 % (ref 3.0–12.0)
Neutro Abs: 3.7 10*3/uL (ref 1.4–7.7)
Neutrophils Relative %: 59.7 % (ref 43.0–77.0)
Platelets: 248 10*3/uL (ref 150.0–400.0)
RBC: 5.39 Mil/uL (ref 4.22–5.81)
RDW: 14.6 % (ref 11.5–15.5)
WBC: 6.2 10*3/uL (ref 4.0–10.5)

## 2017-08-17 LAB — BASIC METABOLIC PANEL
BUN: 14 mg/dL (ref 6–23)
CHLORIDE: 101 meq/L (ref 96–112)
CO2: 29 mEq/L (ref 19–32)
CREATININE: 1.14 mg/dL (ref 0.40–1.50)
Calcium: 9.5 mg/dL (ref 8.4–10.5)
GFR: 73.82 mL/min (ref >60.00)
GLUCOSE: 98 mg/dL (ref 70–99)
Potassium: 4.9 mEq/L (ref 3.5–5.1)
Sodium: 138 mEq/L (ref 135–145)

## 2017-08-17 LAB — HEPATIC FUNCTION PANEL
ALBUMIN: 4.4 g/dL (ref 3.5–5.2)
ALK PHOS: 65 U/L (ref 39–117)
ALT: 20 U/L (ref 0–53)
AST: 14 U/L (ref 0–37)
BILIRUBIN DIRECT: 0.2 mg/dL (ref 0.0–0.3)
BILIRUBIN TOTAL: 1.3 mg/dL — AB (ref 0.2–1.2)
TOTAL PROTEIN: 6.8 g/dL (ref 6.0–8.3)

## 2017-08-17 LAB — TSH: TSH: 1.13 u[IU]/mL (ref 0.35–4.50)

## 2017-08-17 MED ORDER — TERBINAFINE HCL 250 MG PO TABS: 250.0000 mg | ORAL_TABLET | Freq: Every day | ORAL | 0 refills | Status: DC

## 2017-08-17 NOTE — Progress Notes (Signed)
Subjective:     Patient ID: Harold Robinson, male   DOB: February 04, 1971, 46 y.o.   MRN: [id number]  HPI Patient seen for complete physical. 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 He had surgery and long recovery but is back to full work at this time without any difficulties. His other chronic problems include history of hypertension and GERD. He's had some chronic intermittent lumbar back pain. History of depression currently stable on sertraline.  Tetanus up-to-date. Patient is nonsmoker. No regular alcohol use. His chronic medications are sertraline and metoprolol and blood pressures been stable. No headaches. No chest pains.  He has had some thickening and brittle nail changes involving several nails of the right foot. No associated pain. He is interested in options for treatment  Past Medical History:  Diagnosis Date  . Anxiety   . Arthritis    neck  . 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 Esophagitis   . Hiatal hernia   . Hypertension   . Neck pain   . Pleurisy    Past Surgical History:  Procedure Laterality Date  . ORIF TIBIA PLATEAU Left 12/11/2016   Procedure: OPEN REDUCTION INTERNAL FIXATION (ORIF) LEFT TIBIAL PLATEAU FRACTURE;  Surgeon: Mcarthur Rossetti, MD;  Location: [facility];  Service: Orthopedics;  Laterality: Left;  . WISDOM TOOTH EXTRACTION      reports that he has never smoked. He has never used smokeless tobacco. He reports that he does not drink alcohol or use drugs. family history includes Heart disease in his father, paternal aunt, and paternal uncle; Hypertension in his mother. No Known Allergies   Review of Systems  Constitutional: Negative for activity change, appetite change, fatigue and fever.  HENT: Negative for congestion, ear pain and trouble swallowing.   Eyes: Negative for pain and visual disturbance.  Respiratory: Negative for cough, shortness of breath and wheezing.    Cardiovascular: Negative for chest pain and palpitations.  Gastrointestinal: Negative for abdominal distention, abdominal pain, blood in stool, constipation, diarrhea, nausea, rectal pain and vomiting.  Genitourinary: Negative for dysuria, hematuria and testicular pain.  Musculoskeletal: Negative for arthralgias and joint swelling.  Skin: Negative for rash.  Neurological: Negative for dizziness, syncope and headaches.  Hematological: Negative for adenopathy.  Psychiatric/Behavioral: Negative for confusion and dysphoric mood.       Objective:   Physical Exam  Constitutional: He is oriented to person, place, and time. He appears well-developed and well-nourished. No distress.  HENT:  Head: Normocephalic and atraumatic.  Right Ear: External ear normal.  Left Ear: External ear normal.  Mouth/Throat: Oropharynx is clear and moist.  Eyes: Pupils are equal, round, and reactive to light. Conjunctivae and EOM are normal.  Neck: Normal range of motion. Neck supple. No thyromegaly present.  Cardiovascular: Normal rate, regular rhythm and normal heart sounds.   No murmur heard. Pulmonary/Chest: No respiratory distress. He has no wheezes. He has no rales.  Abdominal: Soft. Bowel sounds are normal. He exhibits no distension and no mass. There is no tenderness. There is no rebound and no guarding.  Musculoskeletal: He exhibits no edema.  Lymphadenopathy:    He has no cervical adenopathy.  Neurological: He is alert and oriented to person, place, and time. He displays normal reflexes. No cranial nerve deficit.  Skin: No rash noted.  Patient has brittle nail changes involving the right great toe as well as the fourth and fifth toes.  Psychiatric: He has a normal mood and affect.       Assessment:     Physical exam. Patient had closed left tibial plateau fracture requiring surgery and is healed well from that. Initial blood pressure elevated here today but repeat after rest 132/80. He has  onychomycosis involving several toenails the right foot.    Plan:     -He plans to get flu vaccine through work -Screening lab work obtained -We discussed options for treating his nail fungus. If hepatic function normal consider Lamisil 250 mg once daily for 3 months.  [name] MD [facility]

## 2017-08-22 ENCOUNTER — Other Ambulatory Visit: Payer: Self-pay | Admitting: Family Medicine

## 2018-01-15 ENCOUNTER — Other Ambulatory Visit: Payer: Self-pay | Admitting: Family Medicine

## 2018-05-13 ENCOUNTER — Encounter: Payer: Self-pay | Admitting: Family Medicine

## 2018-05-13 ENCOUNTER — Ambulatory Visit (INDEPENDENT_AMBULATORY_CARE_PROVIDER_SITE_OTHER): Payer: BC Managed Care – PPO | Admitting: Family Medicine

## 2018-05-13 VITALS — BP 130/80 | HR 55 | Temp 98.1°F | Ht 71.0 in | Wt 232.4 lb

## 2018-05-13 DIAGNOSIS — R400 Somnolence: Secondary | ICD-10-CM | POA: Diagnosis not present

## 2018-05-13 DIAGNOSIS — Z0001 Encounter for general adult medical examination with abnormal findings: Secondary | ICD-10-CM | POA: Diagnosis not present

## 2018-05-13 DIAGNOSIS — R0683 Snoring: Secondary | ICD-10-CM | POA: Diagnosis not present

## 2018-05-13 DIAGNOSIS — Z0000 Encounter for general adult medical examination without abnormal findings: Secondary | ICD-10-CM

## 2018-05-13 LAB — TSH: TSH: 0.92 u[IU]/mL (ref 0.35–4.50)

## 2018-05-13 LAB — CBC WITH DIFFERENTIAL/PLATELET
BASOS ABS: 0 10*3/uL (ref 0.0–0.1)
Basophils Relative: 0.6 % (ref 0.0–3.0)
Eosinophils Absolute: 0.3 10*3/uL (ref 0.0–0.7)
Eosinophils Relative: 3.4 % (ref 0.0–5.0)
HCT: 45.7 % (ref 39.0–52.0)
Hemoglobin: 15.3 g/dL (ref 13.0–17.0)
LYMPHS ABS: 2 10*3/uL (ref 0.7–4.0)
Lymphocytes Relative: 26.3 % (ref 12.0–46.0)
MCHC: 33.5 g/dL (ref 30.0–36.0)
MCV: 89.5 fl (ref 78.0–100.0)
MONOS PCT: 7.2 % (ref 3.0–12.0)
Monocytes Absolute: 0.6 10*3/uL (ref 0.1–1.0)
NEUTROS ABS: 4.8 10*3/uL (ref 1.4–7.7)
NEUTROS PCT: 62.5 % (ref 43.0–77.0)
PLATELETS: 287 10*3/uL (ref 150.0–400.0)
RBC: 5.11 Mil/uL (ref 4.22–5.81)
RDW: 14.5 % (ref 11.5–15.5)
WBC: 7.7 10*3/uL (ref 4.0–10.5)

## 2018-05-13 LAB — HEPATIC FUNCTION PANEL
ALT: 24 U/L (ref 0–53)
AST: 13 U/L (ref 0–37)
Albumin: 4.5 g/dL (ref 3.5–5.2)
Alkaline Phosphatase: 80 U/L (ref 39–117)
BILIRUBIN DIRECT: 0.2 mg/dL (ref 0.0–0.3)
TOTAL PROTEIN: 7.2 g/dL (ref 6.0–8.3)
Total Bilirubin: 1 mg/dL (ref 0.2–1.2)

## 2018-05-13 LAB — LIPID PANEL
CHOL/HDL RATIO: 7
Cholesterol: 219 mg/dL — ABNORMAL HIGH (ref 0–200)
HDL: 31.3 mg/dL — ABNORMAL LOW (ref >39.00)
LDL Cholesterol: 150 mg/dL — ABNORMAL HIGH (ref 0–99)
NonHDL: 188.16
TRIGLYCERIDES: 190 mg/dL — AB (ref 0.0–149.0)
VLDL: 38 mg/dL (ref 0.0–40.0)

## 2018-05-13 LAB — BASIC METABOLIC PANEL
BUN: 12 mg/dL (ref 6–23)
CO2: 30 mEq/L (ref 19–32)
Calcium: 9.8 mg/dL (ref 8.4–10.5)
Chloride: 101 mEq/L (ref 96–112)
Creatinine, Ser: 1.14 mg/dL (ref 0.40–1.50)
GFR: 73.57 mL/min (ref >60.00)
Glucose, Bld: 98 mg/dL (ref 70–99)
POTASSIUM: 5.2 meq/L — AB (ref 3.5–5.1)
Sodium: 139 mEq/L (ref 135–145)

## 2018-05-13 MED ORDER — SERTRALINE HCL 100 MG PO TABS: 100.0000 mg | ORAL_TABLET | Freq: Every day | ORAL | 3 refills | Status: DC

## 2018-05-13 MED ORDER — METOPROLOL SUCCINATE ER 50 MG PO TB24: ORAL_TABLET | ORAL | 3 refills | Status: DC

## 2018-05-13 NOTE — Patient Instructions (Signed)
Coronary Calcium Scan A coronary calcium scan is an imaging test used to look for deposits of calcium and other fatty materials (plaques) in the inner lining of the blood vessels of the heart (coronary arteries). These deposits of calcium and plaques can partly clog and narrow the coronary arteries without producing any symptoms or warning signs. This puts a person at risk for a heart attack. This test can detect these deposits before symptoms develop. Tell a health care provider about:  Any allergies you have.  All medicines you are taking, including vitamins, herbs, eye drops, creams, and over-the-counter medicines.  Any problems you or family members have had with anesthetic medicines.  Any blood disorders you have.  Any surgeries you have had.  Any medical conditions you have.  Whether you are pregnant or may be pregnant. What are the risks? Generally, this is a safe procedure. However, problems may occur, including:  Harm to a pregnant woman and her unborn baby. This test involves the use of radiation. Radiation exposure can be dangerous to a pregnant woman and her unborn baby. If you are pregnant, you generally should not have this procedure done.  Slight increase in the risk of cancer. This is because of the radiation involved in the test.  What happens before the procedure? No preparation is needed for this procedure. What happens during the procedure?  You will undress and remove any jewelry around your neck or chest.  You will put on a hospital gown.  Sticky electrodes will be placed on your chest. The electrodes will be connected to an electrocardiogram (ECG) machine to record a tracing of the electrical activity of your heart.  A CT scanner will take pictures of your heart. During this time, you will be asked to lie still and hold your breath for 2-3 seconds while a picture of your heart is being taken. The procedure may vary among health care providers and  hospitals. What happens after the procedure?  You can get dressed.  You can return to your normal activities.  It is up to you to get the results of your test. Ask your health care provider, or the department that is doing the test, when your results will be ready. Summary  A coronary calcium scan is an imaging test used to look for deposits of calcium and other fatty materials (plaques) in the inner lining of the blood vessels of the heart (coronary arteries).  Generally, this is a safe procedure. Tell your health care provider if you are pregnant or may be pregnant.  No preparation is needed for this procedure.  A CT scanner will take pictures of your heart.  You can return to your normal activities after the scan is done. This information is not intended to replace advice given to you by your health care provider. Make sure you discuss any questions you have with your health care provider. Document Released: 05/21/2008 Document Revised: 10/12/2016 Document Reviewed: 10/12/2016 Elsevier Interactive Patient Education  2017 [employer].

## 2018-05-13 NOTE — Progress Notes (Signed)
Subjective:     Patient ID: Harold Robinson, male   DOB: December 04, 1971, 47 y.o.   MRN: [id number]  HPI Patient seen for physical exam. He has history of hypertension, GERD, history depression. He's on sertraline 75 mg daily and would like to increase to 100 mg. No suicidal ideation. Blood pressure been well controlled on metoprolol.  Patient is nonsmoker. 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 Very physical work. No recent chest pains. Father had coronary disease age 57. Also grandfather that died of coronary disease around age 52.  Wife states that he snores frequently and also frequently wakes up at night sometimes gasping for air.  He is not aware of this. Has some daytime somnolence. Increasing fatigue  Past Medical History:  Diagnosis Date  . Anxiety   . Arthritis    neck  . 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 Esophagitis   . Hiatal hernia   . Hypertension   . Neck pain   . Pleurisy    Past Surgical History:  Procedure Laterality Date  . ORIF TIBIA PLATEAU Left 12/11/2016   Procedure: OPEN REDUCTION INTERNAL FIXATION (ORIF) LEFT TIBIAL PLATEAU FRACTURE;  Surgeon: Mcarthur Rossetti, MD;  Location: [facility];  Service: Orthopedics;  Laterality: Left;  . WISDOM TOOTH EXTRACTION      reports that he has never smoked. He has never used smokeless tobacco. He reports that he does not drink alcohol or use drugs. family history includes Heart disease in his father, paternal aunt, and paternal uncle; Hypertension in his mother. No Known Allergies   Review of Systems  Constitutional: Positive for fatigue. Negative for activity change, appetite change and fever.  HENT: Negative for congestion, ear pain and trouble swallowing.   Eyes: Negative for pain and visual disturbance.  Respiratory: Negative for cough, shortness of breath and wheezing.   Cardiovascular: Negative for chest pain and palpitations.   Gastrointestinal: Negative for abdominal distention, abdominal pain, blood in stool, constipation, diarrhea, nausea, rectal pain and vomiting.  Genitourinary: Negative for dysuria, hematuria and testicular pain.  Musculoskeletal: Negative for arthralgias and joint swelling.  Skin: Negative for rash.  Neurological: Negative for dizziness, syncope and headaches.  Hematological: Negative for adenopathy.  Psychiatric/Behavioral: Positive for dysphoric mood. Negative for agitation, confusion and suicidal ideas.       Objective:   Physical Exam  Constitutional: He is oriented to person, place, and time. He appears well-developed and well-nourished. No distress.  HENT:  Head: Normocephalic and atraumatic.  Right Ear: External ear normal.  Left Ear: External ear normal.  Mouth/Throat: Oropharynx is clear and moist.  Eyes: Pupils are equal, round, and reactive to light. Conjunctivae and EOM are normal.  Neck: Normal range of motion. Neck supple. No thyromegaly present.  Cardiovascular: Normal rate, regular rhythm and normal heart sounds.  No murmur heard. Pulmonary/Chest: No respiratory distress. He has no wheezes. He has no rales.  Abdominal: Soft. Bowel sounds are normal. He exhibits no distension and no mass. There is no tenderness. There is no rebound and no guarding.  Musculoskeletal: He exhibits no edema.  Lymphadenopathy:    He has no cervical adenopathy.  Neurological: He is alert and oriented to person, place, and time. He displays normal reflexes. No cranial nerve deficit.  Skin: No rash noted.  Psychiatric: He has a normal mood and affect.       Assessment:  Physical exam. Patient has hypertension well controlled. History of recurrent depression. Concern for possible obstructive sleep apnea. Epworth sleepiness scale score of 12    Plan:     -Recommend set up referral to rule out sleep apnea -Increase sertraline 100 mg daily at bedtime -Obtain screening lab work -We  discussed possible CT coronary calcium score to help stratify risk further.  He is not interested at this time. -Consider more aggressive treatment of lipids with goal LDL less than 70  [name] MD [facility] at [facility]

## 2018-05-16 ENCOUNTER — Other Ambulatory Visit: Payer: Self-pay | Admitting: *Deleted

## 2018-05-16 DIAGNOSIS — E785 Hyperlipidemia, unspecified: Secondary | ICD-10-CM

## 2018-05-16 MED ORDER — ATORVASTATIN CALCIUM 20 MG PO TABS: 20.0000 mg | ORAL_TABLET | Freq: Every day | ORAL | 1 refills | Status: DC

## 2018-07-08 ENCOUNTER — Institutional Professional Consult (permissible substitution): Payer: BC Managed Care – PPO | Admitting: Pulmonary Disease

## 2018-07-14 ENCOUNTER — Other Ambulatory Visit: Payer: Self-pay | Admitting: Family Medicine

## 2018-07-18 ENCOUNTER — Other Ambulatory Visit: Payer: Self-pay

## 2018-11-18 ENCOUNTER — Other Ambulatory Visit: Payer: Self-pay | Admitting: Family Medicine

## 2019-05-12 ENCOUNTER — Other Ambulatory Visit: Payer: Self-pay | Admitting: Family Medicine

## 2019-05-22 ENCOUNTER — Other Ambulatory Visit: Payer: Self-pay | Admitting: Family Medicine

## 2019-06-11 ENCOUNTER — Other Ambulatory Visit: Payer: Self-pay | Admitting: Family Medicine

## 2019-08-14 ENCOUNTER — Other Ambulatory Visit: Payer: Self-pay | Admitting: Family Medicine

## 2019-08-15 ENCOUNTER — Telehealth: Payer: Self-pay | Admitting: Family Medicine

## 2019-08-15 NOTE — Telephone Encounter (Signed)
Patient's wife Lavella Lemons called is asking for patient to switch from Dr.Burchette to [place]. Lavella Lemons said it's too far for patient to drive and see Dr.Burchette.  Dr.Cody is closer to his home.  Can patient switch from Dr. Elease Hashimoto to [place]?

## 2019-08-15 NOTE — Telephone Encounter (Signed)
Called patient and have scheduled him a physical on 08/21/19 at 3pm. Patient verbalized an understanding.

## 2019-08-15 NOTE — Telephone Encounter (Signed)
Yes. No problem

## 2019-08-21 ENCOUNTER — Ambulatory Visit (INDEPENDENT_AMBULATORY_CARE_PROVIDER_SITE_OTHER): Payer: BC Managed Care – PPO | Admitting: Family Medicine

## 2019-08-21 ENCOUNTER — Encounter: Payer: Self-pay | Admitting: Family Medicine

## 2019-08-21 ENCOUNTER — Other Ambulatory Visit: Payer: Self-pay

## 2019-08-21 ENCOUNTER — Telehealth: Payer: Self-pay | Admitting: Family Medicine

## 2019-08-21 VITALS — BP 160/90 | HR 73 | Temp 97.6°F | Wt 242.3 lb

## 2019-08-21 DIAGNOSIS — Z0000 Encounter for general adult medical examination without abnormal findings: Secondary | ICD-10-CM

## 2019-08-21 MED ORDER — METOPROLOL SUCCINATE ER 50 MG PO TB24: ORAL_TABLET | ORAL | 3 refills | Status: DC

## 2019-08-21 MED ORDER — ATORVASTATIN CALCIUM 20 MG PO TABS: 20.0000 mg | ORAL_TABLET | Freq: Every day | ORAL | 3 refills | Status: DC

## 2019-08-21 MED ORDER — SERTRALINE HCL 50 MG PO TABS: 75.0000 mg | ORAL_TABLET | Freq: Every day | ORAL | 1 refills | Status: DC

## 2019-08-21 MED ORDER — VALSARTAN 160 MG PO TABS: 160.0000 mg | ORAL_TABLET | Freq: Every day | ORAL | 3 refills | Status: DC

## 2019-08-21 NOTE — Telephone Encounter (Signed)
We already [facility] transfer- he lives over that way which makes total sense

## 2019-08-21 NOTE — Telephone Encounter (Signed)
Pt would like to transfer his healthcare needs to Dr. Waunita Schooner from Dr. Elease Hashimoto state that this location is closer to his house.  Is this transfer okay?

## 2019-08-21 NOTE — Progress Notes (Signed)
Subjective:     Patient ID: Harold Robinson, male   DOB: 08/18/1971, 48 y.o.   MRN: [id number]  HPI Patient is here for physical exam.  His chronic problems include history of hypertension, history of recurrent depression, history of GERD.  He has recently had some cervical neck pains.  Denies specific injury.  He has gained about 10 pounds of weight since last year.  Sometimes drinks up to 3-4 regular colas per day.  Is on metoprolol for hypertension.  He takes Lipitor for hyperlipidemia.  Family history significant for father having stroke in his early 75s.  There is no family history of diabetes.  No family history of premature CAD.  Patient declines flu vaccinations.  Tetanus due 2023. Patient is a non-smoker  Past Medical History:  Diagnosis Date  . Anxiety   . Arthritis    neck  . 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 Esophagitis   . Hiatal hernia   . Hypertension   . Neck pain   . Pleurisy    Past Surgical History:  Procedure Laterality Date  . ORIF TIBIA PLATEAU Left 12/11/2016   Procedure: OPEN REDUCTION INTERNAL FIXATION (ORIF) LEFT TIBIAL PLATEAU FRACTURE;  Surgeon: Mcarthur Rossetti, MD;  Location: [facility];  Service: Orthopedics;  Laterality: Left;  . WISDOM TOOTH EXTRACTION      reports that he has never smoked. He has never used smokeless tobacco. He reports that he does not drink alcohol or use drugs. family history includes Heart disease in his father, paternal aunt, and paternal uncle; Hypertension in his mother. No Known Allergies  Review of Systems  Constitutional: Negative for fatigue.  HENT: Negative for trouble swallowing.   Eyes: Negative for visual disturbance.  Respiratory: Negative for cough, chest tightness and shortness of breath.   Cardiovascular: Negative for chest pain, palpitations and leg swelling.  Gastrointestinal: Negative for abdominal pain.  Endocrine: Negative for polydipsia and polyuria.  Musculoskeletal:  Positive for back pain and neck pain.  Neurological: Negative for dizziness, syncope, weakness, light-headedness and headaches.       Objective:   Physical Exam Constitutional:      General: He is not in acute distress.    Appearance: He is well-developed.  HENT:     Head: Normocephalic and atraumatic.     Right Ear: External ear normal.     Left Ear: External ear normal.  Eyes:     Conjunctiva/sclera: Conjunctivae normal.     Pupils: Pupils are equal, round, and reactive to light.  Neck:     Musculoskeletal: Normal range of motion and neck supple.     Thyroid: No thyromegaly.  Cardiovascular:     Rate and Rhythm: Normal rate and regular rhythm.     Heart sounds: Normal heart sounds. No murmur.  Pulmonary:     Effort: No respiratory distress.     Breath sounds: No wheezing or rales.  Abdominal:     General: Bowel sounds are normal. There is no distension.     Palpations: Abdomen is soft. There is no mass.     Tenderness: There is no abdominal tenderness. There is no guarding or rebound.  Lymphadenopathy:     Cervical: No cervical adenopathy.  Skin:    Findings: No rash.  Neurological:     Mental Status: He is alert and oriented to person, place, and time.     Cranial Nerves: No cranial nerve deficit.     Deep Tendon  Reflexes: Reflexes normal.        Assessment:     #1 physical exam.  Patient has had substantial weight gain over the past year and blood pressure is poorly controlled today.    Plan:     -We recommended weight loss and discussed strategies -Obtain follow-up labs -Add valsartan 160 mg daily and continue Toprol-XL -We recommended following up either here or with new practice over at [facility] within the next 1 month to reassess blood pressure -Flu vaccine offered and patient declines  [name] MD [facility] at [facility]

## 2019-08-21 NOTE — Telephone Encounter (Signed)
I don't see why this would be problematic for Dr. Einar Pheasant as long as Dr. Elease Hashimoto is agreeable. She is out on maternity leave so okay to schedule once she returns.

## 2019-08-22 LAB — HEPATIC FUNCTION PANEL
ALT: 35 U/L (ref 0–53)
AST: 15 U/L (ref 0–37)
Albumin: 4.5 g/dL (ref 3.5–5.2)
Alkaline Phosphatase: 75 U/L (ref 39–117)
Bilirubin, Direct: 0.2 mg/dL (ref 0.0–0.3)
Total Bilirubin: 1.3 mg/dL — ABNORMAL HIGH (ref 0.2–1.2)
Total Protein: 6.9 g/dL (ref 6.0–8.3)

## 2019-08-22 LAB — BASIC METABOLIC PANEL
BUN: 13 mg/dL (ref 6–23)
CO2: 31 mEq/L (ref 19–32)
Calcium: 9.5 mg/dL (ref 8.4–10.5)
Chloride: 101 mEq/L (ref 96–112)
Creatinine, Ser: 1.08 mg/dL (ref 0.40–1.50)
GFR: 73.27 mL/min (ref >60.00)
Glucose, Bld: 85 mg/dL (ref 70–99)
Potassium: 4.3 mEq/L (ref 3.5–5.1)
Sodium: 140 mEq/L (ref 135–145)

## 2019-08-22 LAB — CBC WITH DIFFERENTIAL/PLATELET
Basophils Absolute: 0 10*3/uL (ref 0.0–0.1)
Basophils Relative: 0.7 % (ref 0.0–3.0)
Eosinophils Absolute: 0.2 10*3/uL (ref 0.0–0.7)
Eosinophils Relative: 3.7 % (ref 0.0–5.0)
HCT: 44 % (ref 39.0–52.0)
Hemoglobin: 14.9 g/dL (ref 13.0–17.0)
Lymphocytes Relative: 27.4 % (ref 12.0–46.0)
Lymphs Abs: 1.8 10*3/uL (ref 0.7–4.0)
MCHC: 33.9 g/dL (ref 30.0–36.0)
MCV: 89.8 fl (ref 78.0–100.0)
Monocytes Absolute: 0.6 10*3/uL (ref 0.1–1.0)
Monocytes Relative: 8.8 % (ref 3.0–12.0)
Neutro Abs: 3.8 10*3/uL (ref 1.4–7.7)
Neutrophils Relative %: 59.4 % (ref 43.0–77.0)
Platelets: 226 10*3/uL (ref 150.0–400.0)
RBC: 4.9 Mil/uL (ref 4.22–5.81)
RDW: 14.2 % (ref 11.5–15.5)
WBC: 6.4 10*3/uL (ref 4.0–10.5)

## 2019-08-22 LAB — LIPID PANEL
Cholesterol: 156 mg/dL (ref 0–200)
HDL: 32.1 mg/dL — ABNORMAL LOW (ref >39.00)
LDL Cholesterol: 86 mg/dL (ref 0–99)
NonHDL: 123.63
Total CHOL/HDL Ratio: 5
Triglycerides: 188 mg/dL — ABNORMAL HIGH (ref 0.0–149.0)
VLDL: 37.6 mg/dL (ref 0.0–40.0)

## 2019-08-22 LAB — TSH: TSH: 1.08 u[IU]/mL (ref 0.35–4.50)

## 2019-08-22 NOTE — Telephone Encounter (Signed)
Pt was called and given the below information.

## 2019-08-29 ENCOUNTER — Telehealth: Payer: Self-pay

## 2019-08-29 NOTE — Telephone Encounter (Signed)
Called wife of patient and confirmed his current medications. Wife verbalized an understanding and stated that she wants to make sure that what she picked up from the pharmacy is correct.  Copied from [place] [id number]. Topic: General - Inquiry >> Aug 29, 2019  9:56 AM Richardo Priest, NT wrote: Reason for CRM: Patient's wife called in and is wondering if someone can give her a call back in regards to medication. Please advise. Call back is [phone].

## 2019-10-03 NOTE — Telephone Encounter (Signed)
[place] with transfer, will just need establish care appointment

## 2020-02-18 ENCOUNTER — Other Ambulatory Visit: Payer: Self-pay | Admitting: Family Medicine

## 2020-02-27 ENCOUNTER — Ambulatory Visit (INDEPENDENT_AMBULATORY_CARE_PROVIDER_SITE_OTHER): Payer: BC Managed Care – PPO | Admitting: Family Medicine

## 2020-02-27 ENCOUNTER — Other Ambulatory Visit: Payer: Self-pay

## 2020-02-27 ENCOUNTER — Encounter: Payer: Self-pay | Admitting: Family Medicine

## 2020-02-27 ENCOUNTER — Telehealth: Payer: Self-pay

## 2020-02-27 NOTE — Progress Notes (Addendum)
Patient presented for new patient appointment with cough, wheezing and new onset sinus congestion and symptoms. Which were not disclosed on his screening questionaire  Asked patient how he responded to the Covid Screening questions and he responded saying he doesn't have covid. Explained that we have safety protocols and that we are in a pandemic and without negative covid testing it is difficult to know if it is allergies or sinus infection or virus.   Offered to still see patient, but patient left frustrated without being evaluated. No charge for this visit.

## 2020-02-27 NOTE — Telephone Encounter (Signed)
See my documentation of the events on the visit notes.

## 2020-02-27 NOTE — Telephone Encounter (Signed)
Interaction in the room with the patient, patient's wife and Dr Einar Pheasant was overheard while I was cleaning the room across the hallway due to the tone/increasing sound of voices. Overheard patient's wife stating that patient's symptoms were allergy related and he gets this every year. Heard Dr Einar Pheasant saying "That is why the pandemic has spread because people think it is allergy symptoms which could also be COVID virus." Dr Einar Pheasant also said that by not telling the truth to the front desk personnel upon making the appointment and when checking in this morning, is disrespectful to the office and our policy. Dr Einar Pheasant said that there was a patient that came in and did not tell the truth upon checking in for her visit and had symptoms and ended up been tested positive for COVID later that day and then Dr Einar Pheasant stated she got COVID from that patient. 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

## 2020-02-27 NOTE — Telephone Encounter (Signed)
I was alerted after patient left the building then returned back to check in cursing, stating, "Now what am I supposed to do?".  As patient left the exam of his own accord and it was now 83mis after his schedule appt time when he returned and we currently did not have any openings, Dr. Einar Pheasant offered to work him in for a virtual later that day or patient could go to urgent care.   When I got back up to the front to address his behavior and offer appointment care options at this point, he had already left.   Patient did not establish care today.

## 2020-03-01 ENCOUNTER — Telehealth: Payer: Self-pay | Admitting: Family Medicine

## 2020-03-01 NOTE — Telephone Encounter (Signed)
Spouse called wanting to transfer care from Dr Einar Pheasant to Dr Damita Dunnings.  She stated pt needs to be seen asap.  OK to schedule??  Spouse aware dr Damita Dunnings is out of ooffice

## 2020-03-03 NOTE — Telephone Encounter (Signed)
Please talk to me about this.  I am closed to new patient/transfer appointments.

## 2020-03-04 NOTE — Telephone Encounter (Signed)
Pt did not establish with me. OK to transfer to any open provider.

## 2020-03-06 NOTE — Telephone Encounter (Signed)
Spoke with patients wife and made husband an acute appointment to be seen tomorrow with Dr. Diona Browner for his respiratory symptoms.  Has had a negative COVID test this week and is cleared to come in the office.     Patient's wife aware that Dr. Damita Dunnings is not accepting new patients and is agreeable for patient to be set up with Webb Silversmith, NP who has verbalized being happy to accept him as a new patient.   Next available [facility] appointment with Webb Silversmith for 04/09/20.  Will have Robin reach out to patient's wife to set up appointment.   Robin,  Please schedule patient for next available NP appt with [facility] in May.  He will be coming in tomorrow (03/07/20) for acute visit to see Dr. Diona Browner until he can be seen for transfer of care.   Thanks, [employer]

## 2020-03-06 NOTE — Telephone Encounter (Signed)
Best number  [phone] Kenney Houseman (spouse) Called to schedule appointment She is aware dr Damita Dunnings is not taking new patients and will wait for [facility] to call her to schedule appointments

## 2020-03-07 ENCOUNTER — Other Ambulatory Visit: Payer: Self-pay

## 2020-03-07 ENCOUNTER — Ambulatory Visit: Payer: BC Managed Care – PPO | Admitting: Family Medicine

## 2020-03-07 ENCOUNTER — Ambulatory Visit (INDEPENDENT_AMBULATORY_CARE_PROVIDER_SITE_OTHER)
Admission: RE | Admit: 2020-03-07 | Discharge: 2020-03-07 | Disposition: A | Discharge status: Home / Self Care | Payer: BC Managed Care – PPO | Source: Ambulatory Visit | Attending: Family Medicine | Admitting: Family Medicine

## 2020-03-07 ENCOUNTER — Encounter: Payer: Self-pay | Admitting: Family Medicine

## 2020-03-07 VITALS — BP 152/94 | HR 63 | Temp 98.4°F | Ht 71.0 in | Wt 248.2 lb

## 2020-03-07 DIAGNOSIS — R0602 Shortness of breath: Secondary | ICD-10-CM

## 2020-03-07 DIAGNOSIS — R221 Localized swelling, mass and lump, neck: Secondary | ICD-10-CM | POA: Diagnosis not present

## 2020-03-07 DIAGNOSIS — I10 Essential (primary) hypertension: Secondary | ICD-10-CM | POA: Diagnosis not present

## 2020-03-07 MED ORDER — PREDNISONE 20 MG PO TABS: ORAL_TABLET | ORAL | 0 refills | Status: DC

## 2020-03-07 MED ORDER — ALBUTEROL SULFATE HFA 108 (90 BASE) MCG/ACT IN AERS: 2.0000 | INHALATION_SPRAY | Freq: Four times a day (QID) | RESPIRATORY_TRACT | 1 refills | Status: DC | PRN

## 2020-03-07 MED ORDER — MONTELUKAST SODIUM 10 MG PO TABS: 10.0000 mg | ORAL_TABLET | Freq: Every day | ORAL | 3 refills | Status: DC

## 2020-03-07 NOTE — Assessment & Plan Note (Signed)
Elevated in office .. if remains elevated despite improved SOB.Marland Kitchen will need med adjustment. Given likely asthma issue, may want to change to ACEI instead of BBlocker which may be contributing.

## 2020-03-07 NOTE — Patient Instructions (Addendum)
Check blood pressure daily at home in next 1-2 weeks. We may need to change blood pressure medication if wheezing not improving as expected.  Start Singulair and zyrtec at bedtime.  Complete prednisone course.  Use albuterol inhaler every 4-6 hours as needed. We will call with final X-ray results. We will call to set up neck ultrasound.

## 2020-03-07 NOTE — Assessment & Plan Note (Addendum)
No S/S of infection. Neg COVID19 test. CXR clear. Most likely reactive airway/asthma... will treat with prednisone taper, albuterol inhaler. Start Singulair and daily antihistamine.  Much less likely cardiac cause.. but consider eval if persistent SOB and CP given strong family history. No S/S of fluid overload. No S/S of DVT/PE
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
## 2020-03-07 NOTE — Assessment & Plan Note (Signed)
Noted incidentally on exam.. refer for US neck.

## 2020-03-07 NOTE — Telephone Encounter (Signed)
Appointment 5/6 Spouse aware

## 2020-03-15 ENCOUNTER — Ambulatory Visit
Admission: RE | Admit: 2020-03-15 | Discharge: 2020-03-15 | Disposition: A | Discharge status: Home / Self Care | Payer: BC Managed Care – PPO | Source: Ambulatory Visit | Attending: Family Medicine | Admitting: Family Medicine

## 2020-03-15 DIAGNOSIS — R221 Localized swelling, mass and lump, neck: Secondary | ICD-10-CM

## 2020-03-18 ENCOUNTER — Telehealth: Payer: Self-pay | Admitting: *Deleted

## 2020-03-18 NOTE — Telephone Encounter (Signed)
Called and spoke with Harold Robinson (wife) about [name]'s ultrasound results.  See result note. While on the phone Harold Robinson states they really liked Dr. Diona Browner and wanted to know if she would take them on as patients.  It would be [name] and their son.  Please advise.

## 2020-03-20 NOTE — Telephone Encounter (Signed)
Tanya notified as instructed by telephone.

## 2020-03-20 NOTE — Telephone Encounter (Signed)
I have a full patient load especially given I am part-time...  I would prefer not to accept 3 new patients at this time.

## 2020-04-11 ENCOUNTER — Encounter: Payer: BC Managed Care – PPO | Admitting: Internal Medicine

## 2020-04-15 ENCOUNTER — Encounter: Payer: Self-pay | Admitting: Internal Medicine

## 2020-04-15 ENCOUNTER — Other Ambulatory Visit: Payer: Self-pay

## 2020-04-15 ENCOUNTER — Ambulatory Visit: Payer: BC Managed Care – PPO | Admitting: Internal Medicine

## 2020-04-15 VITALS — BP 160/84 | HR 58 | Temp 97.3°F | Ht 71.0 in | Wt 247.0 lb

## 2020-04-15 DIAGNOSIS — I10 Essential (primary) hypertension: Secondary | ICD-10-CM

## 2020-04-15 DIAGNOSIS — D170 Benign lipomatous neoplasm of skin and subcutaneous tissue of head, face and neck: Secondary | ICD-10-CM

## 2020-04-15 DIAGNOSIS — F39 Unspecified mood [affective] disorder: Secondary | ICD-10-CM | POA: Insufficient documentation

## 2020-04-15 DIAGNOSIS — E7800 Pure hypercholesterolemia, unspecified: Secondary | ICD-10-CM

## 2020-04-15 DIAGNOSIS — F329 Major depressive disorder, single episode, unspecified: Secondary | ICD-10-CM

## 2020-04-15 DIAGNOSIS — F419 Anxiety disorder, unspecified: Secondary | ICD-10-CM

## 2020-04-15 DIAGNOSIS — M1990 Unspecified osteoarthritis, unspecified site: Secondary | ICD-10-CM | POA: Insufficient documentation

## 2020-04-15 DIAGNOSIS — F32A Depression, unspecified: Secondary | ICD-10-CM

## 2020-04-15 DIAGNOSIS — R0609 Other forms of dyspnea: Secondary | ICD-10-CM

## 2020-04-15 DIAGNOSIS — E785 Hyperlipidemia, unspecified: Secondary | ICD-10-CM | POA: Insufficient documentation

## 2020-04-15 DIAGNOSIS — R0600 Dyspnea, unspecified: Secondary | ICD-10-CM

## 2020-04-15 DIAGNOSIS — M47812 Spondylosis without myelopathy or radiculopathy, cervical region: Secondary | ICD-10-CM

## 2020-04-15 MED ORDER — LOSARTAN POTASSIUM 50 MG PO TABS: 50.0000 mg | ORAL_TABLET | Freq: Every day | ORAL | 0 refills | Status: DC

## 2020-04-15 MED ORDER — SERTRALINE HCL 100 MG PO TABS: 100.0000 mg | ORAL_TABLET | Freq: Every day | ORAL | 1 refills | Status: DC

## 2020-04-15 MED ORDER — ALBUTEROL SULFATE HFA 108 (90 BASE) MCG/ACT IN AERS: 2.0000 | INHALATION_SPRAY | Freq: Four times a day (QID) | RESPIRATORY_TRACT | 1 refills | Status: DC | PRN

## 2020-04-15 NOTE — Assessment & Plan Note (Signed)
Uncontrolled Encouraged DASH diet and exercise for weight loss RX for Losartan 50 mg daily Continue Metoprolol  RTC in 2 weeks for follow up HTN

## 2020-04-15 NOTE — Assessment & Plan Note (Signed)
Stable on Sertraline, refilled today

## 2020-04-15 NOTE — Assessment & Plan Note (Signed)
Continue Atorvastatin Encouraged him to consume a low fat diet 

## 2020-04-15 NOTE — Assessment & Plan Note (Signed)
Continue NSAID's OTC as needed

## 2020-04-15 NOTE — Patient Instructions (Signed)
Lipoma  A lipoma is a noncancerous (benign) tumor that is made up of fat cells. This is a very common type of soft-tissue growth. Lipomas are usually found under the skin (subcutaneous). They may occur in any tissue of the body that contains fat. Common areas for lipomas to appear include the back, arms, shoulders, buttocks, and thighs. Lipomas grow slowly, and they are usually painless. Most lipomas do not cause problems and do not require treatment. What are the causes? The cause of this condition is not known. What increases the risk? You are more likely to develop this condition if:  You are 40-60 years old.  You have a family history of lipomas. What are the signs or symptoms? A lipoma usually appears as a small, round bump under the skin. In most cases, the lump will:  Feel soft or rubbery.  Not cause pain or other symptoms. However, if a lipoma is located in an area where it pushes on nerves, it can become painful or cause other symptoms. How is this diagnosed? A lipoma can usually be diagnosed with a physical exam. You may also have tests to confirm the diagnosis and to rule out other conditions. Tests may include:  Imaging tests, such as a CT scan or an MRI.  Removal of a tissue sample to be looked at under a microscope (biopsy). How is this treated? Treatment for this condition depends on the size of the lipoma and whether it is causing any symptoms.  For small lipomas that are not causing problems, no treatment is needed.  If a lipoma is bigger or it causes problems, surgery may be done to remove the lipoma. Lipomas can also be removed to improve appearance. Most often, the procedure is done after applying a medicine that numbs the area (local anesthetic).  Liposuction may be done to reduce the size of the lipoma before it is removed through surgery, or it may be done to remove the lipoma. Lipomas are removed with this method in order to limit incision size and scarring. A  liposuction tube is inserted through a small incision into the lipoma, and the contents of the lipoma are removed through the tube with suction. Follow these instructions at home:  Watch your lipoma for any changes.  Keep all follow-up visits as told by your health care provider. This is important. Contact a health care provider if:  Your lipoma becomes larger or hard.  Your lipoma becomes painful, red, or increasingly swollen. These could be signs of infection or a more serious condition. Get help right away if:  You develop tingling or numbness in an area near the lipoma. This could indicate that the lipoma is causing nerve damage. Summary  A lipoma is a noncancerous tumor that is made up of fat cells.  Most lipomas do not cause problems and do not require treatment.  If a lipoma is bigger or it causes problems, surgery may be done to remove the lipoma.  Contact a health care provider if your lipoma becomes larger or hard, or if it becomes painful, red, or increasingly swollen. Pain, redness, and swelling could be signs of infection or a more serious condition. This information is not intended to replace advice given to you by your health care provider. Make sure you discuss any questions you have with your health care provider. Document Revised: 07/10/2019 Document Reviewed: 07/10/2019 Elsevier Patient Education  2020 Elsevier Inc.  
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
## 2020-04-29 ENCOUNTER — Ambulatory Visit (INDEPENDENT_AMBULATORY_CARE_PROVIDER_SITE_OTHER): Payer: BC Managed Care – PPO | Admitting: Internal Medicine

## 2020-04-29 ENCOUNTER — Encounter: Payer: Self-pay | Admitting: Internal Medicine

## 2020-04-29 ENCOUNTER — Other Ambulatory Visit: Payer: Self-pay

## 2020-04-29 DIAGNOSIS — I10 Essential (primary) hypertension: Secondary | ICD-10-CM

## 2020-04-29 LAB — BASIC METABOLIC PANEL
BUN: 18 mg/dL (ref 6–23)
CO2: 25 mEq/L (ref 19–32)
Calcium: 8.8 mg/dL (ref 8.4–10.5)
Chloride: 104 mEq/L (ref 96–112)
Creatinine, Ser: 1.1 mg/dL (ref 0.40–1.50)
GFR: 71.52 mL/min (ref >60.00)
Glucose, Bld: 125 mg/dL — ABNORMAL HIGH (ref 70–99)
Potassium: 3.8 mEq/L (ref 3.5–5.1)
Sodium: 138 mEq/L (ref 135–145)

## 2020-04-29 MED ORDER — LOSARTAN POTASSIUM 50 MG PO TABS: 75.0000 mg | ORAL_TABLET | Freq: Every day | ORAL | 0 refills | Status: DC

## 2020-04-29 NOTE — Progress Notes (Signed)
Subjective:    Patient ID: Harold Robinson, male    DOB: May 21, 1971, 49 y.o.   MRN: [id number]  HPI  Patient presents to the clinic today for 2-week follow-up of hypertension.  At his last visit he was started on Losartan in addition to his Metoprolol.  He has been taking the medication as prescribed and denies adverse side effects.  His BP today is 146/78.  ECG from 12/2016 reviewed.  Review of Systems  Past Medical History:  Diagnosis Date  . Anxiety   . Arthritis    neck  . 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 Esophagitis   . Hiatal hernia   . Hyperlipidemia   . Hypertension   . Neck pain   . Pleurisy     Current Outpatient Medications  Medication Sig Dispense Refill  . albuterol (VENTOLIN HFA) 108 (90 Base) MCG/ACT inhaler Inhale 2 puffs into the lungs every 6 (six) hours as needed for wheezing or shortness of breath. 18 g 1  . atorvastatin (LIPITOR) 20 MG tablet Take 1 tablet (20 mg total) by mouth daily. 90 tablet 3  . losartan (COZAAR) 50 MG tablet Take 1 tablet (50 mg total) by mouth daily. 30 tablet 0  . metoprolol succinate (TOPROL-XL) 50 MG 24 hr tablet TAKE 1 TABLET (50 MG TOTAL) BY MOUTH DAILY. 90 tablet 3  . montelukast (SINGULAIR) 10 MG tablet Take 1 tablet (10 mg total) by mouth at bedtime. 30 tablet 3  . sertraline (ZOLOFT) 100 MG tablet Take 1 tablet (100 mg total) by mouth daily. 90 tablet 1   No current facility-administered medications for this visit.    No Known Allergies  Family History  Problem Relation Age of Onset  . Heart disease Father   . Stroke Father   . Heart disease Paternal Aunt   . Heart disease Paternal Uncle   . Hypertension Mother   . Breast cancer Sister   . Colon cancer Neg Hx   . Esophageal cancer Neg Hx   . Rectal cancer Neg Hx   . Stomach cancer Neg Hx     Social History   Socioeconomic History  . Marital status: Married    Spouse name: Not on file  . Number of children: 1  . Years of  education: Not on file  . Highest education level: Not on file  Occupational History  . Occupation: Company secretary: [employer]  Tobacco Use  . Smoking status: Never Smoker  . Smokeless tobacco: Never Used  Substance and Sexual Activity  . Alcohol use: No  . Drug use: No  . Sexual activity: Not on file  Other Topics Concern  . Not on file  Social History Narrative  . Not on file   Social Determinants of Health   Financial Resource Strain:   . Difficulty of Paying Living Expenses:   Food Insecurity:   . Worried About Charity fundraiser in the Last Year:   . Arboriculturist in the Last Year:   Transportation Needs:   . Film/video editor (Medical):   Marland Kitchen Lack of Transportation (Non-Medical):   Physical Activity:   . Days of Exercise per Week:   . Minutes of Exercise per Session:   Stress:   . Feeling of Stress :   Social Connections:   . Frequency of Communication with Friends and Family:   . Frequency of Social Gatherings with Friends  and Family:   . Attends Religious Services:   . Active Member of Clubs or Organizations:   . Attends Archivist Meetings:   Marland Kitchen Marital Status:   Intimate Partner Violence:   . Fear of Current or Ex-Partner:   . Emotionally Abused:   Marland Kitchen Physically Abused:   . Sexually Abused:      Constitutional: Denies fever, malaise, fatigue, headache or abrupt weight changes.  Respiratory: Denies difficulty breathing, shortness of breath, cough or sputum production.   Cardiovascular: Denies chest pain, chest tightness, palpitations or swelling in the hands or feet.  Neurological: Denies dizziness, difficulty with memory, difficulty with speech or problems with balance and coordination.    No other specific complaints in a complete review of systems (except as listed in HPI above).     Objective:   Physical Exam   BP (!) 146/78   Pulse 62   Temp 97.9 F (36.6 C) (Temporal)   Wt 248 lb (112.5 kg)   SpO2 97%   BMI  34.59 kg/m   Wt Readings from Last 3 Encounters:  04/15/20 247 lb (112 kg)  03/07/20 248 lb 4 oz (112.6 kg)  02/27/20 250 lb 8 oz (113.6 kg)    General: Appears his stated age, obese, in NAD. HEENT: Head: normal shape and size; Eyes: sclera white, no icterus, conjunctiva pink, PERRLA and EOMs intact;  Cardiovascular: Normal rate and rhythm, with rare extra beat. S1,S2 noted.  No murmur, rubs or gallops noted. No JVD or BLE edema.  Pulmonary/Chest: Normal effort and positive vesicular breath sounds. No respiratory distress. No wheezes, rales or ronchi noted.  Neurological: Alert and oriented.     BMET    Component Value Date/Time   NA 140 08/21/2019 1535   K 4.3 08/21/2019 1535   CL 101 08/21/2019 1535   CO2 31 08/21/2019 1535   GLUCOSE 85 08/21/2019 1535   BUN 13 08/21/2019 1535   CREATININE 1.08 08/21/2019 1535   CALCIUM 9.5 08/21/2019 1535   GFRNONAA >60 12/09/2016 1048   GFRAA >60 12/09/2016 1048    Lipid Panel     Component Value Date/Time   CHOL 156 08/21/2019 1535   TRIG 188.0 (H) 08/21/2019 1535   HDL 32.10 (L) 08/21/2019 1535   CHOLHDL 5 08/21/2019 1535   VLDL 37.6 08/21/2019 1535   LDLCALC 86 08/21/2019 1535    CBC    Component Value Date/Time   WBC 6.4 08/21/2019 1535   RBC 4.90 08/21/2019 1535   HGB 14.9 08/21/2019 1535   HCT 44.0 08/21/2019 1535   PLT 226.0 08/21/2019 1535   MCV 89.8 08/21/2019 1535   MCH 28.2 12/09/2016 1048   MCHC 33.9 08/21/2019 1535   RDW 14.2 08/21/2019 1535   LYMPHSABS 1.8 08/21/2019 1535   MONOABS 0.6 08/21/2019 1535   EOSABS 0.2 08/21/2019 1535   BASOSABS 0.0 08/21/2019 1535    Hgb A1C No results found for: HGBA1C         Assessment & Plan:   Webb Silversmith, NP This visit occurred during the SARS-CoV-2 public health emergency.  Safety protocols were in place, including screening questions prior to the visit, additional usage of staff PPE, and extensive cleaning of exam room while observing appropriate contact  time as indicated for disinfecting solutions.

## 2020-04-29 NOTE — Patient Instructions (Signed)

## 2020-04-29 NOTE — Assessment & Plan Note (Signed)
Improved but not at goal Increase Losartan to 75 mg daily Reinforced DASH diet and exercise for weight loss Continue Metoprolol  Update me in 10 days with BP reading, make an appt for your annual exam

## 2020-05-07 ENCOUNTER — Other Ambulatory Visit: Payer: Self-pay | Admitting: Internal Medicine

## 2020-05-07 DIAGNOSIS — I10 Essential (primary) hypertension: Secondary | ICD-10-CM

## 2020-05-15 ENCOUNTER — Ambulatory Visit (INDEPENDENT_AMBULATORY_CARE_PROVIDER_SITE_OTHER): Payer: BC Managed Care – PPO | Admitting: Critical Care Medicine

## 2020-05-15 ENCOUNTER — Encounter: Payer: Self-pay | Admitting: Critical Care Medicine

## 2020-05-15 ENCOUNTER — Other Ambulatory Visit: Payer: Self-pay

## 2020-05-15 VITALS — BP 150/80 | HR 60 | Temp 98.4°F | Ht 70.47 in | Wt 250.4 lb

## 2020-05-15 DIAGNOSIS — K219 Gastro-esophageal reflux disease without esophagitis: Secondary | ICD-10-CM | POA: Diagnosis not present

## 2020-05-15 DIAGNOSIS — R0600 Dyspnea, unspecified: Secondary | ICD-10-CM

## 2020-05-15 DIAGNOSIS — R053 Chronic cough: Secondary | ICD-10-CM

## 2020-05-15 DIAGNOSIS — R05 Cough: Secondary | ICD-10-CM

## 2020-05-15 DIAGNOSIS — R0609 Other forms of dyspnea: Secondary | ICD-10-CM

## 2020-05-15 MED ORDER — FLUTICASONE-SALMETEROL 250-50 MCG/DOSE IN AEPB
1.0000 | INHALATION_SPRAY | Freq: Two times a day (BID) | RESPIRATORY_TRACT | 11 refills | Status: DC
Start: 2020-05-15 — End: 2020-08-23

## 2020-05-15 MED ORDER — FLUTICASONE PROPIONATE 50 MCG/ACT NA SUSP
2.0000 | Freq: Every day | NASAL | 11 refills | Status: DC
Start: 2020-05-15 — End: 2021-06-05

## 2020-05-15 MED ORDER — ALBUTEROL SULFATE HFA 108 (90 BASE) MCG/ACT IN AERS: 2.0000 | INHALATION_SPRAY | RESPIRATORY_TRACT | 11 refills | Status: DC | PRN

## 2020-05-15 NOTE — Patient Instructions (Addendum)
Thank you for visiting Dr. Carlis Robinson at [facility]. We recommend the following: Orders Placed This Encounter  Procedures  . Pulmonary function test   Orders Placed This Encounter  Procedures  . Pulmonary function test    Standing Status:   Future    Standing Expiration Date:   05/15/2021    Order Specific Question:   Where should this test be performed?    Answer:   [facility]    Order Specific Question:   Full PFT: includes the following: basic spirometry, spirometry pre & post bronchodilator, diffusion capacity (DLCO), lung volumes    Answer:   Full PFT    Meds ordered this encounter  Medications  . Fluticasone-Salmeterol (ADVAIR DISKUS) 250-50 MCG/DOSE AEPB    Sig: Inhale 1 puff into the lungs 2 (two) times daily.    Dispense:  60 each    Refill:  11  . albuterol (VENTOLIN HFA) 108 (90 Base) MCG/ACT inhaler    Sig: Inhale 2 puffs into the lungs every 4 (four) hours as needed for wheezing or shortness of breath.    Dispense:  18 g    Refill:  11  . fluticasone (FLONASE) 50 MCG/ACT nasal spray    Sig: Place 2 sprays into both nostrils daily.    Dispense:  16 g    Refill:  11    Return in about 2 months (around 07/15/2020). after PFTs.    Please do your part to reduce the spread of COVID-19.  Food Choices for Gastroesophageal Reflux Disease, Adult When you have gastroesophageal reflux disease (GERD), the foods you eat and your eating habits are very important. Choosing the right foods can help ease your discomfort. Think about working with a nutrition specialist (dietitian) to help you make good choices. What are tips for following this plan?  Meals  Choose healthy foods that are low in fat, such as fruits, vegetables, whole grains, low-fat dairy products, and lean meat, fish, and poultry.  Eat small meals often instead of 3 large meals a day. Eat your meals slowly, and in a place where you are relaxed. Avoid bending over or lying down until 2-3 hours after  eating.  Avoid eating meals 2-3 hours before bed.  Avoid drinking a lot of liquid with meals.  Cook foods using methods other than frying. Bake, grill, or broil food instead.  Avoid or limit: ? Chocolate. ? Peppermint or spearmint. ? Alcohol. ? Pepper. ? Black and decaffeinated coffee. ? Black and decaffeinated tea. ? Bubbly (carbonated) soft drinks. ? Caffeinated energy drinks and soft drinks.  Limit high-fat foods such as: ? Fatty meat or fried foods. ? Whole milk, cream, butter, or ice cream. ? Nuts and nut butters. ? Pastries, donuts, and sweets made with butter or shortening.  Avoid foods that cause symptoms. These foods may be different for everyone. Common foods that cause symptoms include: ? Tomatoes. ? Oranges, lemons, and limes. ? Peppers. ? Spicy food. ? Onions and garlic. ? Vinegar. Lifestyle  Maintain a healthy weight. Ask your doctor what weight is healthy for you. If you need to lose weight, work with your doctor to do so safely.  Exercise for at least 30 minutes for 5 or more days each week, or as told by your doctor.  Wear loose-fitting clothes.  Do not smoke. If you need help quitting, ask your doctor.  Sleep with the head of your bed higher than your feet. Use a wedge under the mattress or blocks under the bed  frame to raise the head of the bed. Summary  When you have gastroesophageal reflux disease (GERD), food and lifestyle choices are very important in easing your symptoms.  Eat small meals often instead of 3 large meals a day. Eat your meals slowly, and in a place where you are relaxed.  Limit high-fat foods such as fatty meat or fried foods.  Avoid bending over or lying down until 2-3 hours after eating.  Avoid peppermint and spearmint, caffeine, alcohol, and chocolate. This information is not intended to replace advice given to you by your health care provider. Make sure you discuss any questions you have with your health care  provider. Document Revised: 03/16/2019 Document Reviewed: 12/29/2016 Elsevier Patient Education  [place].

## 2020-05-15 NOTE — Progress Notes (Signed)
Synopsis: Referred in June 2021 for DOE by Jearld Fenton, NP.  Subjective:   PATIENT ID: Harold Robinson GENDER: male DOB: 1971/05/17, MRN: [id number]  Chief Complaint  Patient presents with  . Consult    shortness of breath with exertion, dry cough for over 6 months    Harold Robinson is a 49 y/o gentleman who presents for evaluation of shortness of breath for the past 6 months.  He is accompanied today by his wife.  This began about 1.5 years ago, but has been significantly worse for the last 6 months.  It is mostly dyspnea on exertion, but he occasionally has symptoms at rest.  He has had wheezing recently and has an occasional dry cough, mostly when he is short of breath.  His symptoms are worse when he is in extreme hot or cold temperatures.  His symptoms occur every day and do not seem to be associated with certain environmental exposures.  Overall his symptoms are better with rest.  He has been prescribed Singulair few weeks ago with mild improvement in his symptoms as well as albuterol as needed.  The albuterol causes noticeable improvement in his shortness of breath, but wears off quickly.  He is currently using albuterol several times per day.  He is a never smoker/vaper.  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  It has been more than 20 years since he has been in a combine, and he is usually in well ventilated areas.  He has chronic sinus drainage and congestion.  He uses Vicks VapoRub nightly to relieve his congestion.  He has occasional heartburn, especially if he eats late.  He frequently drinks caffeine and carbonated beverages, but minimal alcohol or mint.  He  coughs when eating.  He does not take anything for acid reflux.  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  He does not think that this has contributed to difficulty swallowing. He has a family history of asthma and allergies in his mother, but no other history of lung disease.           Past Medical History:  Diagnosis Date  . Anxiety   . Arthritis    neck  . 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 Esophagitis   . Hiatal hernia   . Hyperlipidemia   . Hypertension   . Neck pain   . Pleurisy      Family History  Problem Relation Age of Onset  . Heart disease Father   . Stroke Father   . Heart disease Paternal Aunt   . Heart disease Paternal Uncle   . Hypertension Mother   . Asthma Mother   . Allergies Mother   . Breast cancer Sister   . Colon cancer Neg Hx   . Esophageal cancer Neg Hx   . Rectal cancer Neg Hx   . Stomach cancer Neg Hx      Past Surgical History:  Procedure Laterality Date  . ORIF TIBIA PLATEAU Left 12/11/2016   Procedure: OPEN REDUCTION INTERNAL FIXATION (ORIF) LEFT TIBIAL PLATEAU FRACTURE;  Surgeon: Mcarthur Rossetti,  MD;  Location: [facility];  Service: Orthopedics;  Laterality: Left;  . WISDOM TOOTH EXTRACTION      Social History   Socioeconomic History  . Marital status: Married    Spouse name: Not on file  . Number of children: 1  . Years of education: Not on file  . Highest education level: Not on file  Occupational History  . Occupation: Company secretary: [employer]  Tobacco Use  . Smoking status: Never Smoker  . Smokeless tobacco: Never Used  Substance and Sexual Activity  . Alcohol use: No  . Drug use: No  . Sexual activity: Not on file  Other Topics Concern  . Not on file  Social History Narrative  . Not on file   Social Determinants of Health   Financial Resource Strain:   . Difficulty of Paying Living Expenses:   Food Insecurity:   .  Worried About Charity fundraiser in the Last Year:   . Arboriculturist in the Last Year:   Transportation Needs:   . Film/video editor (Medical):   Marland Kitchen Lack of Transportation (Non-Medical):   Physical Activity:   . Days of Exercise per Week:   . Minutes of Exercise per Session:   Stress:   . Feeling of Stress :   Social Connections:   . Frequency of Communication with Friends and Family:   . Frequency of Social Gatherings with Friends and Family:   . Attends Religious Services:   . Active Member of Clubs or Organizations:   . Attends Archivist Meetings:   Marland Kitchen Marital Status:   Intimate Partner Violence:   . Fear of Current or Ex-Partner:   . Emotionally Abused:   Marland Kitchen Physically Abused:   . Sexually Abused:      No Known Allergies   Immunization History  Administered Date(s) Administered  . Tdap 06/13/2012    Outpatient Medications Prior to Visit  Medication Sig Dispense Refill  . atorvastatin (LIPITOR) 20 MG tablet Take 1 tablet (20 mg total) by mouth daily. 90 tablet 3  . losartan (COZAAR) 50 MG tablet Take 1.5 tablets (75 mg total) by mouth daily. 45 tablet 0  . metoprolol succinate (TOPROL-XL) 50 MG 24 hr tablet TAKE 1 TABLET (50 MG TOTAL) BY MOUTH DAILY. 90 tablet 3  . montelukast (SINGULAIR) 10 MG tablet Take 1 tablet (10 mg total) by mouth at bedtime. 30 tablet 3  . sertraline (ZOLOFT) 100 MG tablet Take 1 tablet (100 mg total) by mouth daily. 90 tablet 1  . albuterol (VENTOLIN HFA) 108 (90 Base) MCG/ACT inhaler Inhale 2 puffs into the lungs every 6 (six) hours as needed for wheezing or shortness of breath. 18 g 1   No facility-administered medications prior to visit.    Review of Systems  Constitutional: Negative for chills, fever and weight loss.  HENT: Positive for congestion.        Ear fullness, postnasal drip  Respiratory: Positive for cough, shortness of breath and wheezing. Negative for sputum production.   Cardiovascular: Negative for chest  pain and leg swelling.  Gastrointestinal: Positive for heartburn. Negative for nausea and vomiting.  Musculoskeletal: Positive for neck pain. Negative for myalgias.  Skin: Negative for rash.  Endo/Heme/Allergies: Positive for environmental allergies.     Objective:   Vitals:   05/15/20 1628  BP: (!) 150/80  Pulse: 60  Temp: 98.4 F (36.9 C)  TempSrc: Oral  SpO2: 93%  Weight:  250 lb 6.4 oz (113.6 kg)  Height: 5' 10.47" (1.79 m)   93% on   RA BMI Readings from Last 3 Encounters:  05/15/20 35.45 kg/m  04/29/20 34.59 kg/m  04/15/20 34.45 kg/m   Wt Readings from Last 3 Encounters:  05/15/20 250 lb 6.4 oz (113.6 kg)  04/29/20 248 lb (112.5 kg)  04/15/20 247 lb (112 kg)    Physical Exam Vitals reviewed.  Constitutional:      General: He is not in acute distress.    Appearance: He is obese. He is not ill-appearing.  HENT:     Head: Normocephalic and atraumatic.     Nose:     Comments: No significant nasal erythema or drainage    Mouth/Throat:     Comments: Mallampati 4 Eyes:     General: No scleral icterus. Neck:     Comments: Soft mobile mass in the left submandibular region, several centimeters in diameter. Cardiovascular:     Rate and Rhythm: Normal rate and regular rhythm.     Heart sounds: No murmur.  Pulmonary:     Comments: Breathing comfortably on room air, no conversational dyspnea.  Speaking in full sentences.  No significant coughing.  Clear to auscultation bilaterally. Abdominal:     General: There is no distension.     Palpations: Abdomen is soft.  Musculoskeletal:        General: No swelling or deformity.     Cervical back: Neck supple.  Skin:    General: Skin is warm and dry.     Findings: No rash.  Neurological:     General: No focal deficit present.     Mental Status: He is alert.     Coordination: Coordination normal.  Psychiatric:        Mood and Affect: Mood normal.        Behavior: Behavior normal.      CBC    Component Value  Date/Time   WBC 6.4 08/21/2019 1535   RBC 4.90 08/21/2019 1535   HGB 14.9 08/21/2019 1535   HCT 44.0 08/21/2019 1535   PLT 226.0 08/21/2019 1535   MCV 89.8 08/21/2019 1535   MCH 28.2 12/09/2016 1048   MCHC 33.9 08/21/2019 1535   RDW 14.2 08/21/2019 1535   LYMPHSABS 1.8 08/21/2019 1535   MONOABS 0.6 08/21/2019 1535   EOSABS 0.2 08/21/2019 1535   BASOSABS 0.0 08/21/2019 1535    CHEMISTRY No results for input(s): NA, K, CL, CO2, GLUCOSE, BUN, CREATININE, CALCIUM, MG, PHOS in the last 168 hours. Estimated Creatinine Clearance: 105.6 mL/min (by C-G formula based on SCr of 1.1 mg/dL).   Chest Imaging- films reviewed: CXR, 2 view 03/07/2020-no opacities or masses  CT coronary 05/31/2009, lung images reviewed-motion artifact, no significant nodules or reticular changes.  Mild atelectasis in the right base.  Pulmonary Functions Testing Results: No flowsheet data found.      Assessment & Plan:     ICD-10-CM   1. DOE (dyspnea on exertion)  R06.00 Pulmonary function test    CANCELED: CT Chest High Resolution  2. Chronic cough  R05 Pulmonary function test    CANCELED: CT Chest High Resolution  3. Dyspnea on exertion  R06.00 albuterol (VENTOLIN HFA) 108 (90 Base) MCG/ACT inhaler  4. Gastroesophageal reflux disease, unspecified whether esophagitis present  K21.9      Chronic DOE, wheezing, cough-- concern for moderate persistent asthma. Unclear if allergic vs occupational. -PFTs to confirm diagnosis -If confirmed will check IgE and eosinophils -Trial of Advair  twice daily.  Instructed to rinse his mouth after every use. Continue albuterol every 4 hours as needed. -Educated him on my concern for being in high risk situations with uncontrolled asthma.  This increases the need for an appropriate control medication. -We will defer HRCT chest unless findings concerning for restriction. -May need hypersensitivity pneumonitis panel given history of hay exposure  Allergic  rhinosinusitis -Continue Singulair daily -Start Flonase 2 sprays bilaterally once daily  GERD -Discussed appropriate dietary modifications, especially avoiding eating late and eating low fat content meals when eating late.  Reduce carbonation caffeine intake -Maintaining a healthy weight as a long-term goal would be appropriate -Likely needs barium esophagram   RTC in 1 to 2 months after PFTs.   Current Outpatient Medications:  .  albuterol (VENTOLIN HFA) 108 (90 Base) MCG/ACT inhaler, Inhale 2 puffs into the lungs every 4 (four) hours as needed for wheezing or shortness of breath., Disp: 18 g, Rfl: 11 .  atorvastatin (LIPITOR) 20 MG tablet, Take 1 tablet (20 mg total) by mouth daily., Disp: 90 tablet, Rfl: 3 .  fluticasone (FLONASE) 50 MCG/ACT nasal spray, Place 2 sprays into both nostrils daily., Disp: 16 g, Rfl: 11 .  Fluticasone-Salmeterol (ADVAIR DISKUS) 250-50 MCG/DOSE AEPB, Inhale 1 puff into the lungs 2 (two) times daily., Disp: 60 each, Rfl: 11 .  losartan (COZAAR) 50 MG tablet, Take 1.5 tablets (75 mg total) by mouth daily., Disp: 45 tablet, Rfl: 0 .  metoprolol succinate (TOPROL-XL) 50 MG 24 hr tablet, TAKE 1 TABLET (50 MG TOTAL) BY MOUTH DAILY., Disp: 90 tablet, Rfl: 3 .  montelukast (SINGULAIR) 10 MG tablet, Take 1 tablet (10 mg total) by mouth at bedtime., Disp: 30 tablet, Rfl: 3 .  sertraline (ZOLOFT) 100 MG tablet, Take 1 tablet (100 mg total) by mouth daily., Disp: 90 tablet, Rfl: 1    Julian Hy, DO [facility] 05/15/2020 5:49 PM

## 2020-05-29 ENCOUNTER — Other Ambulatory Visit: Payer: Self-pay | Admitting: Family Medicine

## 2020-05-30 NOTE — Telephone Encounter (Signed)
This was never prescribed by you, you have seen pt recently, please advise if okay to refill

## 2020-06-08 ENCOUNTER — Other Ambulatory Visit (HOSPITAL_COMMUNITY)
Admission: RE | Admit: 2020-06-08 | Discharge: 2020-06-08 | Disposition: A | Discharge status: Home / Self Care | Payer: BC Managed Care – PPO | Source: Ambulatory Visit | Attending: Critical Care Medicine | Admitting: Critical Care Medicine

## 2020-06-08 DIAGNOSIS — Z20822 Contact with and (suspected) exposure to covid-19: Secondary | ICD-10-CM | POA: Insufficient documentation

## 2020-06-08 DIAGNOSIS — Z01812 Encounter for preprocedural laboratory examination: Secondary | ICD-10-CM | POA: Diagnosis not present

## 2020-06-08 LAB — SARS CORONAVIRUS 2 (TAT 6-24 HRS): SARS Coronavirus 2: NEGATIVE

## 2020-06-11 ENCOUNTER — Ambulatory Visit (INDEPENDENT_AMBULATORY_CARE_PROVIDER_SITE_OTHER): Payer: BC Managed Care – PPO | Admitting: Critical Care Medicine

## 2020-06-11 ENCOUNTER — Other Ambulatory Visit: Payer: Self-pay

## 2020-06-11 DIAGNOSIS — R0600 Dyspnea, unspecified: Secondary | ICD-10-CM

## 2020-06-11 DIAGNOSIS — R053 Chronic cough: Secondary | ICD-10-CM

## 2020-06-11 DIAGNOSIS — R0609 Other forms of dyspnea: Secondary | ICD-10-CM

## 2020-06-11 LAB — PULMONARY FUNCTION TEST
DL/VA % pred: 98 %
DL/VA: 4.43 ml/min/mmHg/L
DLCO cor % pred: 72 %
DLCO cor: 22.36 ml/min/mmHg
DLCO unc % pred: 72 %
DLCO unc: 22.36 ml/min/mmHg
FEF 25-75 Post: 3.76 L/sec
FEF 25-75 Pre: 3.56 L/sec
FEF2575-%Change-Post: 5 %
FEF2575-%Pred-Post: 101 %
FEF2575-%Pred-Pre: 95 %
FEV1-%Change-Post: 5 %
FEV1-%Pred-Post: 81 %
FEV1-%Pred-Pre: 76 %
FEV1-Post: 3.37 L
FEV1-Pre: 3.19 L
FEV1FVC-%Change-Post: 3 %
FEV1FVC-%Pred-Pre: 105 %
FEV6-%Change-Post: 1 %
FEV6-%Pred-Post: 76 %
FEV6-%Pred-Pre: 75 %
FEV6-Post: 3.93 L
FEV6-Pre: 3.88 L
FEV6FVC-%Pred-Post: 103 %
FEV6FVC-%Pred-Pre: 103 %
FVC-%Change-Post: 2 %
FVC-%Pred-Post: 74 %
FVC-%Pred-Pre: 73 %
FVC-Post: 3.96 L
FVC-Pre: 3.88 L
Post FEV1/FVC ratio: 85 %
Post FEV6/FVC ratio: 100 %
Pre FEV1/FVC ratio: 82 %
Pre FEV6/FVC Ratio: 100 %
RV % pred: 106 %
RV: 2.16 L
TLC % pred: 82 %
TLC: 5.88 L

## 2020-06-11 NOTE — Progress Notes (Signed)
PFT completed today.  

## 2020-07-01 ENCOUNTER — Telehealth: Payer: Self-pay | Admitting: Critical Care Medicine

## 2020-07-01 NOTE — Telephone Encounter (Signed)
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  States he has been out in the heat and felt weak.  He is feeling better today.  I advised him to stay hydrated and use his inhaler as he states it is helping.  Advised that Dr. Carlis Abbott is out of town until 07/07/20 and will send this to her to review PFT and will call him with results after it has been reviewed.  Pt. Verbalized understanding.  Advised to call back with worsening s/s.  Dr. Carlis Abbott,  Patient is wondering about his PFT results.  Please advise when you return.

## 2020-07-02 NOTE — Telephone Encounter (Signed)
Message will be sent to the APP of the day since Dr. Carlis Abbott is out of the office this week.  Harold Robinson - please advise. Thanks.

## 2020-07-02 NOTE — Telephone Encounter (Signed)
ATC pt. Unable to leave VM. WCB.  

## 2020-07-02 NOTE — Telephone Encounter (Signed)
Patient was seen for a pulmonary consult on May 15, 2020 for cough shortness of breath and wheezing concerning for asthma.  Patient was started on Advair.  Breathing test done on June 11, 2020 showed mild restriction with no airflow obstruction his FEV1 was 81%, ratio 85 and FVC 74% there was no significant bronchodilator response and DLCO was slightly decreased at 72%.  This could be consistent with asthma however will need to discuss at follow-up visit along with clinical exam to discuss how he is doing.  Started here that he did not have a good weekend if needs a sooner follow-up please set up an an office visit.  Please make sure he has a follow-up with Dr. Carlis Abbott as recommended Dr. Carlis Abbott will also go over his pulmonary function testing on follow-up visit in detail  Please contact office for sooner follow up if symptoms do not improve or worsen or seek emergency care

## 2020-07-03 NOTE — Telephone Encounter (Signed)
Spoke with the pt and notified of response per TP  He verbalized understanding  He states feels ok to wait for appt with Dr Carlis Abbott 08/23/20  Nothing further needed

## 2020-07-04 ENCOUNTER — Other Ambulatory Visit: Payer: Self-pay | Admitting: Internal Medicine

## 2020-07-04 DIAGNOSIS — I10 Essential (primary) hypertension: Secondary | ICD-10-CM

## 2020-07-08 NOTE — Telephone Encounter (Signed)
Agree, the PFT results need to be considered in the context of how he has been doing. If he is acutely worsening he can be seen sooner as an acute visit.   Julian Hy, DO 07/08/20 5:13 PM  Pulmonary & Critical Care

## 2020-07-29 ENCOUNTER — Other Ambulatory Visit: Payer: Self-pay | Admitting: Internal Medicine

## 2020-07-29 DIAGNOSIS — I10 Essential (primary) hypertension: Secondary | ICD-10-CM

## 2020-08-13 ENCOUNTER — Other Ambulatory Visit: Payer: BC Managed Care – PPO

## 2020-08-13 ENCOUNTER — Other Ambulatory Visit: Payer: Self-pay

## 2020-08-13 DIAGNOSIS — Z20822 Contact with and (suspected) exposure to covid-19: Secondary | ICD-10-CM

## 2020-08-14 LAB — NOVEL CORONAVIRUS, NAA: SARS-CoV-2, NAA: NOT DETECTED

## 2020-08-20 ENCOUNTER — Other Ambulatory Visit: Payer: Self-pay

## 2020-08-20 ENCOUNTER — Other Ambulatory Visit: Payer: BC Managed Care – PPO

## 2020-08-20 DIAGNOSIS — Z20822 Contact with and (suspected) exposure to covid-19: Secondary | ICD-10-CM

## 2020-08-22 LAB — NOVEL CORONAVIRUS, NAA: SARS-CoV-2, NAA: NOT DETECTED

## 2020-08-22 LAB — SARS-COV-2, NAA 2 DAY TAT

## 2020-08-23 ENCOUNTER — Other Ambulatory Visit: Payer: Self-pay

## 2020-08-23 ENCOUNTER — Ambulatory Visit: Payer: BC Managed Care – PPO | Admitting: Critical Care Medicine

## 2020-08-23 ENCOUNTER — Encounter: Payer: Self-pay | Admitting: Critical Care Medicine

## 2020-08-23 VITALS — BP 130/70 | HR 70 | Temp 97.5°F | Ht 71.0 in | Wt 242.8 lb

## 2020-08-23 DIAGNOSIS — R0602 Shortness of breath: Secondary | ICD-10-CM

## 2020-08-23 DIAGNOSIS — J4540 Moderate persistent asthma, uncomplicated: Secondary | ICD-10-CM

## 2020-08-23 DIAGNOSIS — Z8616 Personal history of COVID-19: Secondary | ICD-10-CM | POA: Diagnosis not present

## 2020-08-23 MED ORDER — FLUTICASONE-SALMETEROL 500-50 MCG/DOSE IN AEPB: 1.0000 | INHALATION_SPRAY | Freq: Two times a day (BID) | RESPIRATORY_TRACT | 11 refills | Status: DC

## 2020-08-23 NOTE — Progress Notes (Signed)
Synopsis: Referred in June 2021 for DOE by Jearld Fenton, NP.  Subjective:   PATIENT ID: Harold Robinson GENDER: male DOB: 05-26-1971, MRN: [id number]  Chief Complaint  Patient presents with  . Follow-up    Patient states he feels the same as last visit. PFT done 06/11/20.  Did a home test for Covid and it was positive for him and his wife on 1st of August. Wheezing bad per wife. Sometimes dry cough    Harold Robinson is a 49 year old gentleman who presents for follow-up of chronic shortness of breath.  He is accompanied today by his wife.  His breathing is about the same since he was last seen.  At his last visit he was started on Advair twice daily.  He was on this for about 1.5 months before contracting Covid in early August, with some improvement in his symptoms.  He was diagnosed on a home test.  He has had persistent symptoms of shortness of breath, wheezing, cough since that time.  He has had increased wheezing, even when sitting still-his wife reports that he has a rattling sound in his chest.  His symptoms of coughing shortness of breath are worse when the weather is hot.  He continues to feel weak since he had Covid a little over a month ago.  He continues taking Singulair daily and uses albuterol multiple times per day, with improvement in his symptoms.  He continues to have some sinus symptoms and postnasal drip.  The Flonase is helping, especially using at night.  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  He works as a Building control surveyor, but only uses respiratory protection with some metals that are higher risk.  He does less welding recently than he did earlier in his career.    OV 05/15/20: Harold Robinson is a 49 y/o gentleman who presents for evaluation of shortness of breath for the past 6 months.  He is accompanied today by his wife.  This began about 1.5 years ago, but has been significantly worse for the last 6 months.  It is mostly dyspnea on exertion, but he  occasionally has symptoms at rest.  He has had wheezing recently and has an occasional dry cough, mostly when he is short of breath.  His symptoms are worse when he is in extreme hot or cold temperatures.  His symptoms occur every day and do not seem to be associated with certain environmental exposures.  Overall his symptoms are better with rest.  He has been prescribed Singulair few weeks ago with mild improvement in his symptoms as well as albuterol as needed.  The albuterol causes noticeable improvement in his shortness of breath, but wears off quickly.  He is currently using albuterol several times per day.  He is a never smoker/vaper.  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  It has been more than 20 years since he has been in a combine, and he is usually in well ventilated areas.  He has chronic sinus drainage and congestion.  He uses Vicks VapoRub nightly to relieve his congestion.  He has occasional heartburn, especially if he eats late.  He frequently drinks caffeine  and carbonated beverages, but minimal alcohol or mint.  He coughs when eating.  He does not take anything for acid reflux.  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  He does not think that this has contributed to difficulty swallowing. He has a family history of asthma and allergies in his mother, but no other history of lung disease.      Past Medical History:  Diagnosis Date  . Anxiety   . Arthritis    neck  . 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 Esophagitis   . Hiatal hernia   . Hyperlipidemia   . Hypertension   . Neck pain   .  Pleurisy      Family History  Problem Relation Age of Onset  . Heart disease Father   . Stroke Father   . Heart disease Paternal Aunt   . Heart disease Paternal Uncle   . Hypertension Mother   . Asthma Mother   . Allergies Mother   . Breast cancer Sister   . Colon cancer Neg Hx   . Esophageal cancer Neg Hx   . Rectal cancer Neg Hx   . Stomach cancer Neg Hx      Past Surgical History:  Procedure Laterality Date  . ORIF TIBIA PLATEAU Left 12/11/2016   Procedure: OPEN REDUCTION INTERNAL FIXATION (ORIF) LEFT TIBIAL PLATEAU FRACTURE;  Surgeon: Mcarthur Rossetti, MD;  Location: [facility];  Service: Orthopedics;  Laterality: Left;  . WISDOM TOOTH EXTRACTION      Social History   Socioeconomic History  . Marital status: Married    Spouse name: Not on file  . Number of children: 1  . Years of education: Not on file  . Highest education level: Not on file  Occupational History  . Occupation: Company secretary: [employer]  Tobacco Use  . Smoking status: Never Smoker  . Smokeless tobacco: Never Used  Vaping Use  . Vaping Use: Never used  Substance and Sexual Activity  . Alcohol use: No  . Drug use: No  . Sexual activity: Not on file  Other Topics Concern  . Not on file  Social History Narrative  . Not on file   Social Determinants of Health   Financial Resource Strain:   . Difficulty of Paying Living Expenses: Not on file  Food Insecurity:   . Worried About Charity fundraiser in the Last Year: Not on file  . Ran Out of Food in the Last Year: Not on file  Transportation Needs:   . Lack of Transportation (Medical): Not on file  . Lack of Transportation (Non-Medical): Not on file  Physical Activity:   . Days of Exercise per Week: Not on file  . Minutes of Exercise per Session: Not on file  Stress:   . Feeling of Stress : Not on file  Social Connections:   . Frequency of Communication with Friends and Family: Not on file  . Frequency of Social  Gatherings with Friends and Family: Not on file  . Attends Religious Services: Not on file  . Active Member of Clubs or Organizations: Not on file  . Attends Archivist Meetings: Not on file  . Marital Status: Not on file  Intimate Partner Violence:   . Fear of Current or Ex-Partner: Not on file  . Emotionally Abused: Not on file  . Physically Abused: Not  on file  . Sexually Abused: Not on file     No Known Allergies   Immunization History  Administered Date(s) Administered  . Tdap 06/13/2012    Outpatient Medications Prior to Visit  Medication Sig Dispense Refill  . albuterol (VENTOLIN HFA) 108 (90 Base) MCG/ACT inhaler Inhale 2 puffs into the lungs every 4 (four) hours as needed for wheezing or shortness of breath. 18 g 11  . atorvastatin (LIPITOR) 20 MG tablet Take 1 tablet (20 mg total) by mouth daily. 90 tablet 3  . fluticasone (FLONASE) 50 MCG/ACT nasal spray Place 2 sprays into both nostrils daily. 16 g 11  . Fluticasone-Salmeterol (ADVAIR DISKUS) 250-50 MCG/DOSE AEPB Inhale 1 puff into the lungs 2 (two) times daily. 60 each 11  . losartan (COZAAR) 50 MG tablet Take 1.5 tablets (75 mg total) by mouth daily. MUST SCHEDULE PHYSICAL 45 tablet 0  . metoprolol succinate (TOPROL-XL) 50 MG 24 hr tablet TAKE 1 TABLET (50 MG TOTAL) BY MOUTH DAILY. 90 tablet 3  . montelukast (SINGULAIR) 10 MG tablet TAKE 1 TABLET BY MOUTH EVERYDAY AT BEDTIME 90 tablet 1  . sertraline (ZOLOFT) 100 MG tablet Take 1 tablet (100 mg total) by mouth daily. 90 tablet 1   No facility-administered medications prior to visit.    Review of Systems  Constitutional: Negative for chills, fever and weight loss.  HENT: Positive for congestion.        Ear fullness, postnasal drip  Respiratory: Positive for cough, shortness of breath and wheezing. Negative for sputum production.   Cardiovascular: Negative for chest pain and leg swelling.  Gastrointestinal: Positive for heartburn. Negative for nausea and  vomiting.  Musculoskeletal: Positive for neck pain. Negative for myalgias.  Skin: Negative for rash.  Endo/Heme/Allergies: Positive for environmental allergies.     Objective:   Vitals:   08/23/20 1021  BP: 130/70  Pulse: 70  Temp: (!) 97.5 F (36.4 C)  TempSrc: Temporal  SpO2: 98%  Weight: 242 lb 12.8 oz (110.1 kg)  Height: 5\' 11"  (1.803 m)   98% on   RA BMI Readings from Last 3 Encounters:  08/23/20 33.86 kg/m  05/15/20 35.45 kg/m  04/29/20 34.59 kg/m   Wt Readings from Last 3 Encounters:  08/23/20 242 lb 12.8 oz (110.1 kg)  05/15/20 250 lb 6.4 oz (113.6 kg)  04/29/20 248 lb (112.5 kg)    Physical Exam Vitals reviewed.  Constitutional:      General: He is not in acute distress.    Appearance: He is not ill-appearing.     Comments: Fatigued appearing  HENT:     Head: Normocephalic and atraumatic.  Eyes:     General: No scleral icterus. Cardiovascular:     Rate and Rhythm: Normal rate and regular rhythm.     Heart sounds: No murmur heard.   Pulmonary:     Comments: CTAB, breathing comfortably on RA, no conversationan dyspnea Abdominal:     General: There is no distension.     Palpations: Abdomen is soft.  Musculoskeletal:        General: No swelling or deformity.     Cervical back: Neck supple.  Lymphadenopathy:     Cervical: No cervical adenopathy.  Skin:    General: Skin is warm and dry.     Findings: No rash.  Neurological:     General: No focal deficit present.     Mental Status: He is alert.     Coordination: Coordination normal.  Psychiatric:  Mood and Affect: Mood normal.        Behavior: Behavior normal.      CBC    Component Value Date/Time   WBC 6.4 08/21/2019 1535   RBC 4.90 08/21/2019 1535   HGB 14.9 08/21/2019 1535   HCT 44.0 08/21/2019 1535   PLT 226.0 08/21/2019 1535   MCV 89.8 08/21/2019 1535   MCH 28.2 12/09/2016 1048   MCHC 33.9 08/21/2019 1535   RDW 14.2 08/21/2019 1535   LYMPHSABS 1.8 08/21/2019 1535    MONOABS 0.6 08/21/2019 1535   EOSABS 0.2 08/21/2019 1535   BASOSABS 0.0 08/21/2019 1535    CHEMISTRY No results for input(s): NA, K, CL, CO2, GLUCOSE, BUN, CREATININE, CALCIUM, MG, PHOS in the last 168 hours. CrCl cannot be calculated (Patient's most recent lab result is older than the maximum 21 days allowed.).   Chest Imaging- films reviewed: CXR, 2 view 03/07/2020-no opacities or masses  CT coronary 05/31/2009, lung images reviewed-motion artifact, no significant nodules or reticular changes.  Mild atelectasis in the right base.  Pulmonary Functions Testing Results: PFT Results Latest Ref Rng & Units 06/11/2020  FVC-Pre L 3.88  FVC-Predicted Pre % 73  FVC-Post L 3.96  FVC-Predicted Post % 74  Pre FEV1/FVC % % 82  Post FEV1/FCV % % 85  FEV1-Pre L 3.19  FEV1-Predicted Pre % 76  FEV1-Post L 3.37  DLCO uncorrected ml/min/mmHg 22.36  DLCO UNC% % 72  DLCO corrected ml/min/mmHg 22.36  DLCO COR %Predicted % 72  DLVA Predicted % 98  TLC L 5.88  TLC % Predicted % 82  RV % Predicted % 106   2021- No significant obstruction or bronchodilator reversibility.  No significant restriction.  Mild diffusion impairment.  Flow volume loop suggests restriction.     Assessment & Plan:     ICD-10-CM   1. History of COVID-19  Z86.16 Pulmonary function test  2. Shortness of breath  R06.02 Pulmonary function test    CT Chest High Resolution  3. Moderate persistent allergic asthma  J45.40 Pulmonary function test     Chronic DOE, wheezing, cough-- concern for moderate persistent asthma, although PFTs performed prior to his Covid infection did not demonstrate obstructive lung disease.  He had some improvement with ICS-LABA, but has worsening symptoms since having Covid. -Needs PFTs and HRCT chest 3 months after Covid infection (November 2021) -Continue Advair twice daily-increase to high-dose Advair. Continue albuterol every 4 hours as needed. 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 -May need hypersensitivity pneumonitis panel given history of hay exposure. -Recommend flu vaccination, pneumonia vaccinations, and Covid vaccination.  He understands that he remains at risk for repeat Covid infection, although his risk is reduced.  History of Covid infection, persistent symptoms -HRCT chest, PFTs 3 months after infection  Allergic rhinosinusitis -Continue Singulair daily -Continue Flonase 2 sprays bilaterally once daily   Not discussed today: GERD -Discussed appropriate dietary modifications, especially avoiding eating late and eating low fat content meals when eating late.  Reduce carbonation caffeine intake -Maintaining a healthy weight as a long-term goal would be appropriate -Likely needs barium esophagram   RTC in 3 months with Dr. Erin Fulling.   Current Outpatient Medications:  .  albuterol (VENTOLIN HFA) 108 (90 Base) MCG/ACT inhaler, Inhale 2 puffs into the lungs every 4 (four) hours as needed for wheezing or shortness of breath., Disp: 18 g, Rfl: 11 .  atorvastatin (LIPITOR) 20  MG tablet, Take 1 tablet (20 mg total) by mouth daily., Disp: 90 tablet, Rfl: 3 .  fluticasone (FLONASE) 50 MCG/ACT nasal spray, Place 2 sprays into both nostrils daily., Disp: 16 g, Rfl: 11 .  Fluticasone-Salmeterol (ADVAIR DISKUS) 250-50 MCG/DOSE AEPB, Inhale 1 puff into the lungs 2 (two) times daily., Disp: 60 each, Rfl: 11 .  losartan (COZAAR) 50 MG tablet, Take 1.5 tablets (75 mg total) by mouth daily. MUST SCHEDULE PHYSICAL, Disp: 45 tablet, Rfl: 0 .  metoprolol succinate (TOPROL-XL) 50 MG 24 hr tablet, TAKE 1 TABLET (50 MG TOTAL) BY MOUTH DAILY., Disp: 90 tablet, Rfl: 3 .  montelukast (SINGULAIR) 10 MG tablet, TAKE 1 TABLET BY MOUTH EVERYDAY AT BEDTIME, Disp: 90 tablet, Rfl: 1 .  sertraline (ZOLOFT) 100 MG tablet, Take 1 tablet (100 mg total) by mouth daily., Disp: 90 tablet, Rfl: 1    Julian Hy,  DO [facility] 08/23/2020 10:44 AM

## 2020-08-23 NOTE — Patient Instructions (Addendum)
Thank you for visiting Dr. Carlis Abbott at [facility]. We recommend the following: Orders Placed This Encounter  Procedures  . CT Chest High Resolution  . Pulmonary function test   Orders Placed This Encounter  Procedures  . CT Chest High Resolution    November 2021    Standing Status:   Future    Standing Expiration Date:   08/23/2021    Order Specific Question:   Preferred imaging location?    Answer:   [facility]    Order Specific Question:   Radiology Contrast Protocol - do NOT remove file path    Answer:   [email or web address]  . Pulmonary function test    Standing Status:   Future    Standing Expiration Date:   08/23/2021    Scheduling Instructions:     November 2021    Order Specific Question:   Where should this test be performed?    Answer:   [facility]    Order Specific Question:   Full PFT: includes the following: basic spirometry, spirometry pre & post bronchodilator, diffusion capacity (DLCO), lung volumes    Answer:   Full PFT    Meds ordered this encounter  Medications  . Fluticasone-Salmeterol (ADVAIR DISKUS) 500-50 MCG/DOSE AEPB    Sig: Inhale 1 puff into the lungs in the morning and at bedtime.    Dispense:  60 each    Refill:  11    Return in about 2 months (around 10/23/2020). with me.    Please do your part to reduce the spread of COVID-19.

## 2020-08-25 ENCOUNTER — Other Ambulatory Visit: Payer: Self-pay | Admitting: Internal Medicine

## 2020-08-25 DIAGNOSIS — I10 Essential (primary) hypertension: Secondary | ICD-10-CM

## 2020-08-27 ENCOUNTER — Other Ambulatory Visit: Payer: BC Managed Care – PPO

## 2020-08-27 ENCOUNTER — Other Ambulatory Visit: Payer: Self-pay

## 2020-08-27 DIAGNOSIS — Z20822 Contact with and (suspected) exposure to covid-19: Secondary | ICD-10-CM

## 2020-08-29 LAB — SARS-COV-2, NAA 2 DAY TAT

## 2020-08-29 LAB — NOVEL CORONAVIRUS, NAA: SARS-CoV-2, NAA: NOT DETECTED

## 2020-09-03 ENCOUNTER — Other Ambulatory Visit: Payer: BC Managed Care – PPO

## 2020-09-03 DIAGNOSIS — Z20822 Contact with and (suspected) exposure to covid-19: Secondary | ICD-10-CM

## 2020-09-04 LAB — SARS-COV-2, NAA 2 DAY TAT

## 2020-09-04 LAB — NOVEL CORONAVIRUS, NAA: SARS-CoV-2, NAA: NOT DETECTED

## 2020-09-09 ENCOUNTER — Other Ambulatory Visit: Payer: Self-pay | Admitting: Family Medicine

## 2020-09-10 ENCOUNTER — Other Ambulatory Visit: Payer: BC Managed Care – PPO

## 2020-09-10 DIAGNOSIS — Z20822 Contact with and (suspected) exposure to covid-19: Secondary | ICD-10-CM

## 2020-09-11 LAB — NOVEL CORONAVIRUS, NAA: SARS-CoV-2, NAA: NOT DETECTED

## 2020-09-11 LAB — SARS-COV-2, NAA 2 DAY TAT

## 2020-09-17 ENCOUNTER — Other Ambulatory Visit: Payer: BC Managed Care – PPO

## 2020-09-17 DIAGNOSIS — Z20822 Contact with and (suspected) exposure to covid-19: Secondary | ICD-10-CM

## 2020-09-18 LAB — SARS-COV-2, NAA 2 DAY TAT

## 2020-09-18 LAB — NOVEL CORONAVIRUS, NAA: SARS-CoV-2, NAA: NOT DETECTED

## 2020-09-22 ENCOUNTER — Other Ambulatory Visit: Payer: Self-pay | Admitting: Internal Medicine

## 2020-09-22 DIAGNOSIS — I10 Essential (primary) hypertension: Secondary | ICD-10-CM

## 2020-09-24 ENCOUNTER — Other Ambulatory Visit: Payer: BC Managed Care – PPO

## 2020-09-24 DIAGNOSIS — Z20822 Contact with and (suspected) exposure to covid-19: Secondary | ICD-10-CM

## 2020-09-25 LAB — SARS-COV-2, NAA 2 DAY TAT

## 2020-09-25 LAB — NOVEL CORONAVIRUS, NAA: SARS-CoV-2, NAA: NOT DETECTED

## 2020-10-01 ENCOUNTER — Other Ambulatory Visit: Payer: BC Managed Care – PPO

## 2020-10-15 ENCOUNTER — Other Ambulatory Visit: Payer: BC Managed Care – PPO

## 2020-10-15 DIAGNOSIS — Z20822 Contact with and (suspected) exposure to covid-19: Secondary | ICD-10-CM

## 2020-10-16 ENCOUNTER — Other Ambulatory Visit: Payer: Self-pay

## 2020-10-16 ENCOUNTER — Ambulatory Visit (INDEPENDENT_AMBULATORY_CARE_PROVIDER_SITE_OTHER)
Admission: RE | Admit: 2020-10-16 | Discharge: 2020-10-16 | Disposition: A | Discharge status: Home / Self Care | Payer: BC Managed Care – PPO | Source: Ambulatory Visit | Attending: Critical Care Medicine | Admitting: Critical Care Medicine

## 2020-10-16 DIAGNOSIS — R0602 Shortness of breath: Secondary | ICD-10-CM

## 2020-10-16 LAB — NOVEL CORONAVIRUS, NAA: SARS-CoV-2, NAA: NOT DETECTED

## 2020-10-16 LAB — SARS-COV-2, NAA 2 DAY TAT

## 2020-10-20 ENCOUNTER — Other Ambulatory Visit: Payer: Self-pay | Admitting: Internal Medicine

## 2020-10-20 DIAGNOSIS — I10 Essential (primary) hypertension: Secondary | ICD-10-CM

## 2020-10-22 ENCOUNTER — Other Ambulatory Visit: Payer: BC Managed Care – PPO

## 2020-10-22 DIAGNOSIS — Z20822 Contact with and (suspected) exposure to covid-19: Secondary | ICD-10-CM

## 2020-10-23 ENCOUNTER — Telehealth: Payer: Self-pay

## 2020-10-23 ENCOUNTER — Other Ambulatory Visit: Payer: Self-pay

## 2020-10-23 ENCOUNTER — Encounter: Payer: Self-pay | Admitting: Critical Care Medicine

## 2020-10-23 ENCOUNTER — Ambulatory Visit: Payer: BC Managed Care – PPO | Admitting: Critical Care Medicine

## 2020-10-23 VITALS — BP 140/88 | HR 64 | Temp 99.1°F | Ht 71.0 in | Wt 242.0 lb

## 2020-10-23 DIAGNOSIS — R0609 Other forms of dyspnea: Secondary | ICD-10-CM

## 2020-10-23 DIAGNOSIS — J3089 Other allergic rhinitis: Secondary | ICD-10-CM | POA: Diagnosis not present

## 2020-10-23 DIAGNOSIS — R0600 Dyspnea, unspecified: Secondary | ICD-10-CM

## 2020-10-23 DIAGNOSIS — R062 Wheezing: Secondary | ICD-10-CM | POA: Diagnosis not present

## 2020-10-23 LAB — NOVEL CORONAVIRUS, NAA: SARS-CoV-2, NAA: NOT DETECTED

## 2020-10-23 LAB — SARS-COV-2, NAA 2 DAY TAT

## 2020-10-23 MED ORDER — AMOXICILLIN-POT CLAVULANATE 875-125 MG PO TABS: 1.0000 | ORAL_TABLET | Freq: Two times a day (BID) | ORAL | 0 refills | Status: DC

## 2020-10-23 NOTE — Patient Instructions (Addendum)
Thank you for visiting Dr. Carlis Abbott at [facility]. We recommend the following: Orders Placed This Encounter  Procedures  . IgE  . CBC w/Diff   Orders Placed This Encounter  Procedures  . IgE    Standing Status:   Future    Standing Expiration Date:   10/23/2021  . CBC w/Diff    Standing Status:   Future    Standing Expiration Date:   10/23/2021    Meds ordered this encounter  Medications  . amoxicillin-clavulanate (AUGMENTIN) 875-125 MG tablet    Sig: Take 1 tablet by mouth 2 (two) times daily.    Dispense:  20 tablet    Refill:  0    Return in about 2 months (around 12/23/2020). with Dr. Erin Fulling (30 minutes visit).    Please do your part to reduce the spread of COVID-19.

## 2020-10-23 NOTE — Telephone Encounter (Signed)
ATC patient at [phone] about getting scheduled for PFT on Friday 10/25/20 voicemail not set up to leave message. Called wife to see if I could get him scheduled at [facility] if they call back please schedule patient for PFT as he has already had Covid test done and will still be in the 3 day window.

## 2020-10-23 NOTE — Progress Notes (Signed)
Synopsis: Referred in June 2021 for DOE by Harold Fenton, NP.  Subjective:   PATIENT ID: Harold Robinson GENDER: male DOB: [birth year], MRN: [id number]  Chief Complaint  Patient presents with  . Follow-up    Unable to do PFT until covid test reults, feels good overall. CT done 10/17/20    Mr. Harold Robinson is a 49 year old gentleman who presents for follow-up of chronic wheezing and shortness of breath.  He was switched to high-dose Advair at his last visit.  He was originally seen prior to having Covid and had benefit when he was started on LABA-ICS despite having no obstruction on PFTs.  He contracted Covid in August 2021 and has had uncontrolled symptoms since.  Underwent HRCT chest on 11/11.  He was scheduled for PFTs today, but his Covid test has not returned.  Overall his breathing is not doing well.  He still wheezing and requiring his albuterol almost every day.  He does notice benefit when he uses it.  He has been having choking episodes that wake him up at night.  He is having postnasal drip requiring throat clearing.  No sneezing, itchy nose or eyes, throat or soft palate itching.  His main complaint is ongoing sinus congestion.  He continues on Flonase and montelukast.  He has not been on antihistamine.   OV 08/23/20: Mr. Harold Robinson is a 49 year old gentleman who presents for follow-up of chronic shortness of breath.  He is accompanied today by his wife.  His breathing is about the same since he was last seen.  At his last visit he was started on Advair twice daily.  He was on this for about 1.5 months before contracting Covid in early August, with some improvement in his symptoms.  He was diagnosed on a home test.  He has had persistent symptoms of shortness of breath, wheezing, cough since that time.  He has had increased wheezing, even when sitting still-his wife reports that he has a rattling sound in his chest.  His symptoms of coughing shortness of breath are worse when the weather is hot.  He  continues to feel weak since he had Covid a little over a month ago.  He continues taking Singulair daily and uses albuterol multiple times per day, with improvement in his symptoms.  He continues to have some sinus symptoms and postnasal drip.  The Flonase is helping, especially using at night.  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  He works as a Building control surveyor, but only uses respiratory protection with some metals that are higher risk.  He does less welding recently than he did earlier in his career.   OV 05/15/20: Mr. Harold Robinson is a 49 y/o gentleman who presents for evaluation of shortness of breath for the past 6 months.  He is accompanied today by his wife.  This began about 1.5 years ago, but has been significantly worse for the last 6 months.  It is mostly dyspnea on exertion, but he occasionally has symptoms at rest.  He has had wheezing recently and has an occasional dry cough, mostly when he is short of breath.  His symptoms are worse when he is in extreme hot or cold temperatures.  His symptoms occur every day and do not seem to be associated with certain environmental exposures.  Overall his symptoms are better with rest.  He has been prescribed Singulair few weeks ago with mild improvement in his symptoms as well as albuterol as needed.  The  albuterol causes noticeable improvement in his shortness of breath, but wears off quickly.  He is currently using albuterol several times per day.  He is a never smoker/vaper.  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  It has been more than 20 years since he has been in a combine, and he is usually in well  ventilated areas.  He has chronic sinus drainage and congestion.  He uses Vicks VapoRub nightly to relieve his congestion.  He has occasional heartburn, especially if he eats late.  He frequently drinks caffeine and carbonated beverages, but minimal alcohol or mint.  He coughs when eating.  He does not take anything for acid reflux.  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  He does not think that this has contributed to difficulty swallowing. He has a family history of asthma and allergies in his mother, but no other history of lung disease.    Past Medical History:  Diagnosis Date  . Anxiety   . Arthritis    neck  . 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 Esophagitis   . Hiatal hernia   . Hyperlipidemia   . Hypertension   . Neck pain   . Pleurisy      Family History  Problem Relation Age of Onset  . Heart disease Father   . Stroke Father   . Heart disease Paternal Aunt   . Heart disease Paternal Uncle   . Hypertension Mother   . Asthma Mother   . Allergies Mother   . Breast cancer Sister   . Colon cancer Neg Hx   . Esophageal cancer Neg Hx   . Rectal cancer Neg Hx   . Stomach cancer Neg Hx      Past Surgical History:  Procedure Laterality Date  . ORIF TIBIA PLATEAU Left 12/11/2016   Procedure: OPEN REDUCTION INTERNAL FIXATION (ORIF) LEFT TIBIAL PLATEAU FRACTURE;  Surgeon: Mcarthur Rossetti, MD;  Location: [facility];  Service: Orthopedics;  Laterality: Left;  . WISDOM TOOTH EXTRACTION      Social History   Socioeconomic History  . Marital status: Married    Spouse name: Not on file  . Number of children: 1  . Years of education: Not on file  . Highest education level: Not on file  Occupational History  . Occupation: Company secretary: [employer]  Tobacco Use  . Smoking status: Never Smoker  . Smokeless tobacco: Never Used  Vaping Use  . Vaping Use: Never used  Substance and Sexual  Activity  . Alcohol use: No  . Drug use: No  . Sexual activity: Not on file  Other Topics Concern  . Not on file  Social History Narrative  . Not on file   Social Determinants of Health   Financial Resource Strain:   . Difficulty of Paying Living Expenses: Not on file  Food Insecurity:   . Worried About Charity fundraiser in the Last Year: Not on file  . Ran Out of Food in the Last Year: Not on file  Transportation Needs:   .  Lack of Transportation (Medical): Not on file  . Lack of Transportation (Non-Medical): Not on file  Physical Activity:   . Days of Exercise per Week: Not on file  . Minutes of Exercise per Session: Not on file  Stress:   . Feeling of Stress : Not on file  Social Connections:   . Frequency of Communication with Friends and Family: Not on file  . Frequency of Social Gatherings with Friends and Family: Not on file  . Attends Religious Services: Not on file  . Active Member of Clubs or Organizations: Not on file  . Attends Archivist Meetings: Not on file  . Marital Status: Not on file  Intimate Partner Violence:   . Fear of Current or Ex-Partner: Not on file  . Emotionally Abused: Not on file  . Physically Abused: Not on file  . Sexually Abused: Not on file     No Known Allergies   Immunization History  Administered Date(s) Administered  . Tdap 06/13/2012    Outpatient Medications Prior to Visit  Medication Sig Dispense Refill  . albuterol (VENTOLIN HFA) 108 (90 Base) MCG/ACT inhaler Inhale 2 puffs into the lungs every 4 (four) hours as needed for wheezing or shortness of breath. 18 g 11  . atorvastatin (LIPITOR) 20 MG tablet Take 1 tablet (20 mg total) by mouth daily. 90 tablet 3  . fluticasone (FLONASE) 50 MCG/ACT nasal spray Place 2 sprays into both nostrils daily. 16 g 11  . Fluticasone-Salmeterol (ADVAIR DISKUS) 500-50 MCG/DOSE AEPB Inhale 1 puff into the lungs in the morning and at bedtime. 60 each 11  . losartan (COZAAR) 50 MG  tablet TAKE 1.5 TABLETS (75 MG TOTAL) BY MOUTH DAILY. MUST SCHEDULE PHYSICAL 45 tablet 0  . metoprolol succinate (TOPROL-XL) 50 MG 24 hr tablet TAKE 1 TABLET (50 MG TOTAL) BY MOUTH DAILY. 90 tablet 3  . montelukast (SINGULAIR) 10 MG tablet TAKE 1 TABLET BY MOUTH EVERYDAY AT BEDTIME 90 tablet 1  . sertraline (ZOLOFT) 100 MG tablet Take 1 tablet (100 mg total) by mouth daily. 90 tablet 1   No facility-administered medications prior to visit.    Review of Systems  Constitutional: Negative for chills, fever and weight loss.  HENT: Positive for congestion.        Ear fullness, postnasal drip  Respiratory: Positive for cough, shortness of breath and wheezing. Negative for sputum production.   Cardiovascular: Negative for chest pain and leg swelling.  Gastrointestinal: Positive for heartburn. Negative for nausea and vomiting.  Musculoskeletal: Positive for neck pain. Negative for myalgias.  Skin: Negative for rash.  Endo/Heme/Allergies: Positive for environmental allergies.     Objective:   Vitals:   10/23/20 0955  BP: 140/88  Pulse: 64  Temp: 99.1 F (37.3 C)  TempSrc: Temporal  SpO2: 95%  Weight: 242 lb (109.8 kg)  Height: 5\' 11"  (1.803 m)   95% on   RA BMI Readings from Last 3 Encounters:  10/23/20 33.75 kg/m  08/23/20 33.86 kg/m  05/15/20 35.45 kg/m   Wt Readings from Last 3 Encounters:  10/23/20 242 lb (109.8 kg)  08/23/20 242 lb 12.8 oz (110.1 kg)  05/15/20 250 lb 6.4 oz (113.6 kg)    Physical Exam Vitals reviewed.  Constitutional:      General: He is not in acute distress.    Appearance: He is not ill-appearing.  HENT:     Head: Normocephalic and atraumatic.     Nose:     Comments: Mild erythema and  inflammation of turbinates, no drainage. Congested sounding voice. Eyes:     General: No scleral icterus. Cardiovascular:     Rate and Rhythm: Normal rate and regular rhythm.     Heart sounds: No murmur heard.   Pulmonary:     Comments: CTAB, breathing  comfortably on RA. No conversational dyspnea. Abdominal:     General: There is no distension.     Palpations: Abdomen is soft.     Tenderness: There is no abdominal tenderness.  Musculoskeletal:        General: No deformity.     Cervical back: Neck supple.  Skin:    General: Skin is warm and dry.     Findings: No rash.  Neurological:     General: No focal deficit present.     Mental Status: He is alert.     Coordination: Coordination normal.  Psychiatric:        Mood and Affect: Mood normal.        Behavior: Behavior normal.      CBC    Component Value Date/Time   WBC 6.4 08/21/2019 1535   RBC 4.90 08/21/2019 1535   HGB 14.9 08/21/2019 1535   HCT 44.0 08/21/2019 1535   PLT 226.0 08/21/2019 1535   MCV 89.8 08/21/2019 1535   MCH 28.2 12/09/2016 1048   MCHC 33.9 08/21/2019 1535   RDW 14.2 08/21/2019 1535   LYMPHSABS 1.8 08/21/2019 1535   MONOABS 0.6 08/21/2019 1535   EOSABS 0.2 08/21/2019 1535   BASOSABS 0.0 08/21/2019 1535    CHEMISTRY No results for input(s): NA, K, CL, CO2, GLUCOSE, BUN, CREATININE, CALCIUM, MG, PHOS in the last 168 hours. CrCl cannot be calculated (Patient's most recent lab result is older than the maximum 21 days allowed.).   Chest Imaging- films reviewed: CXR, 2 view 03/07/2020-no opacities or masses  CT coronary 05/31/2009, lung images reviewed-motion artifact, no significant nodules or reticular changes.  Mild atelectasis in the right base.  HRCT chest 10/17/20-mild airway thickening. No scarring. No significant mediastinal or hilar adenopathy.  Pulmonary Functions Testing Results: PFT Results Latest Ref Rng & Units 06/11/2020  FVC-Pre L 3.88  FVC-Predicted Pre % 73  FVC-Post L 3.96  FVC-Predicted Post % 74  Pre FEV1/FVC % % 82  Post FEV1/FCV % % 85  FEV1-Pre L 3.19  FEV1-Predicted Pre % 76  FEV1-Post L 3.37  DLCO uncorrected ml/min/mmHg 22.36  DLCO UNC% % 72  DLCO corrected ml/min/mmHg 22.36  DLCO COR %Predicted % 72  DLVA Predicted  % 98  TLC L 5.88  TLC % Predicted % 82  RV % Predicted % 106   10/2020-   06/2020- No significant obstruction or bronchodilator reversibility.  No significant restriction.  Mild diffusion impairment.  Flow volume loop suggests restriction.     Assessment & Plan:     ICD-10-CM   1. Non-seasonal allergic rhinitis, unspecified trigger  J30.89 IgE    CBC w/Diff  2. DOE (dyspnea on exertion)  R06.00 IgE    CBC w/Diff  3. Wheezing  R06.2 IgE    CBC w/Diff     Chronic DOE, wheezing, cough-- concern for moderate persistent asthma, although PFTs performed prior to his Covid infection did not demonstrate obstructive lung disease.  He had some improvement with ICS-LABA, but has worsening symptoms since having Covid. Seems like uncontrolled sinusitis is contributing. -Needs PFTs to be rescheduled this week due to Covid test not resulting in time  -HRCT chest reviewed during visit today demonstrating airway  thickening could be consistent with asthma.  No parenchymal lung disease. -Continue high-dose Advair twice daily.  Rinse after every use. -Continue montelukast.  Adding cetirizine daily. -Checking CBC with differential and IgE -Has previously refused vaccinations.  Undergoes weekly Covid testing for his job.  History of Covid infection, persistent symptoms -HRCT chest w/o post Covid scarring -Reschedule PFTs  Allergic rhinosinusitis- persistently uncontrolled for month. -Continue Singulair daily.  Adding cetirizine daily. -Continue Flonase 2 sprays bilaterally once daily. -May need referral to allergist if not improving. -Due to persistence of symptoms, trial of Augmentin x10 days.   Not discussed today: GERD -Discussed appropriate dietary modifications, especially avoiding eating late and eating low fat content meals when eating late.  Reduce carbonation caffeine intake -Maintaining a healthy weight as a long-term goal would be appropriate -Likely needs barium esophagram   RTC  in 2 months with Dr. Erin Fulling.   Current Outpatient Medications:  .  albuterol (VENTOLIN HFA) 108 (90 Base) MCG/ACT inhaler, Inhale 2 puffs into the lungs every 4 (four) hours as needed for wheezing or shortness of breath., Disp: 18 g, Rfl: 11 .  atorvastatin (LIPITOR) 20 MG tablet, Take 1 tablet (20 mg total) by mouth daily., Disp: 90 tablet, Rfl: 3 .  fluticasone (FLONASE) 50 MCG/ACT nasal spray, Place 2 sprays into both nostrils daily., Disp: 16 g, Rfl: 11 .  Fluticasone-Salmeterol (ADVAIR DISKUS) 500-50 MCG/DOSE AEPB, Inhale 1 puff into the lungs in the morning and at bedtime., Disp: 60 each, Rfl: 11 .  losartan (COZAAR) 50 MG tablet, TAKE 1.5 TABLETS (75 MG TOTAL) BY MOUTH DAILY. MUST SCHEDULE PHYSICAL, Disp: 45 tablet, Rfl: 0 .  metoprolol succinate (TOPROL-XL) 50 MG 24 hr tablet, TAKE 1 TABLET (50 MG TOTAL) BY MOUTH DAILY., Disp: 90 tablet, Rfl: 3 .  montelukast (SINGULAIR) 10 MG tablet, TAKE 1 TABLET BY MOUTH EVERYDAY AT BEDTIME, Disp: 90 tablet, Rfl: 1 .  sertraline (ZOLOFT) 100 MG tablet, Take 1 tablet (100 mg total) by mouth daily., Disp: 90 tablet, Rfl: 1 .  amoxicillin-clavulanate (AUGMENTIN) 875-125 MG tablet, Take 1 tablet by mouth 2 (two) times daily., Disp: 20 tablet, Rfl: 0    Julian Hy, DO [facility] 10/23/2020 10:22 AM

## 2020-10-24 ENCOUNTER — Other Ambulatory Visit: Payer: Self-pay | Admitting: Critical Care Medicine

## 2020-10-24 DIAGNOSIS — R0600 Dyspnea, unspecified: Secondary | ICD-10-CM

## 2020-10-24 DIAGNOSIS — R0609 Other forms of dyspnea: Secondary | ICD-10-CM

## 2020-10-24 NOTE — Telephone Encounter (Signed)
Patient is now scheduled for PFT on 10/25/20 at 1pm. Nothing further needed at this time.

## 2020-10-25 ENCOUNTER — Other Ambulatory Visit: Payer: Self-pay | Admitting: Internal Medicine

## 2020-10-25 ENCOUNTER — Ambulatory Visit (INDEPENDENT_AMBULATORY_CARE_PROVIDER_SITE_OTHER): Payer: BC Managed Care – PPO | Admitting: Critical Care Medicine

## 2020-10-25 ENCOUNTER — Other Ambulatory Visit: Payer: Self-pay

## 2020-10-25 DIAGNOSIS — J3089 Other allergic rhinitis: Secondary | ICD-10-CM

## 2020-10-25 DIAGNOSIS — R0600 Dyspnea, unspecified: Secondary | ICD-10-CM | POA: Diagnosis not present

## 2020-10-25 DIAGNOSIS — F32A Depression, unspecified: Secondary | ICD-10-CM

## 2020-10-25 DIAGNOSIS — F419 Anxiety disorder, unspecified: Secondary | ICD-10-CM

## 2020-10-25 DIAGNOSIS — R062 Wheezing: Secondary | ICD-10-CM

## 2020-10-25 DIAGNOSIS — R0609 Other forms of dyspnea: Secondary | ICD-10-CM

## 2020-10-25 LAB — PULMONARY FUNCTION TEST
DL/VA % pred: 97 %
DL/VA: 4.36 ml/min/mmHg/L
DLCO cor % pred: 82 %
DLCO cor: 25.37 ml/min/mmHg
DLCO unc % pred: 82 %
DLCO unc: 25.37 ml/min/mmHg
FEF 25-75 Post: 3.51 L/sec
FEF 25-75 Pre: 4.14 L/sec
FEF2575-%Change-Post: -15 %
FEF2575-%Pred-Post: 95 %
FEF2575-%Pred-Pre: 112 %
FEV1-%Change-Post: 0 %
FEV1-%Pred-Post: 82 %
FEV1-%Pred-Pre: 82 %
FEV1-Post: 3.4 L
FEV1-Pre: 3.4 L
FEV1FVC-%Change-Post: 0 %
FEV1FVC-%Pred-Pre: 108 %
FEV6-%Change-Post: 0 %
FEV6-%Pred-Post: 79 %
FEV6-%Pred-Pre: 78 %
FEV6-Post: 4.06 L
FEV6-Pre: 4.02 L
FEV6FVC-%Pred-Post: 103 %
FEV6FVC-%Pred-Pre: 103 %
FVC-%Change-Post: 0 %
FVC-%Pred-Post: 76 %
FVC-%Pred-Pre: 76 %
FVC-Post: 4.06 L
FVC-Pre: 4.02 L
Post FEV1/FVC ratio: 84 %
Post FEV6/FVC ratio: 100 %
Pre FEV1/FVC ratio: 85 %
Pre FEV6/FVC Ratio: 100 %
RV % pred: 52 %
RV: 1.08 L
TLC % pred: 73 %
TLC: 5.23 L

## 2020-10-25 LAB — CBC WITH DIFFERENTIAL/PLATELET
Basophils Absolute: 0 10*3/uL (ref 0.0–0.1)
Basophils Relative: 0.5 % (ref 0.0–3.0)
Eosinophils Absolute: 0.2 10*3/uL (ref 0.0–0.7)
Eosinophils Relative: 3.8 % (ref 0.0–5.0)
HCT: 44.7 % (ref 39.0–52.0)
Hemoglobin: 15.3 g/dL (ref 13.0–17.0)
Lymphocytes Relative: 26.7 % (ref 12.0–46.0)
Lymphs Abs: 1.7 10*3/uL (ref 0.7–4.0)
MCHC: 34.1 g/dL (ref 30.0–36.0)
MCV: 86.6 fl (ref 78.0–100.0)
Monocytes Absolute: 0.5 10*3/uL (ref 0.1–1.0)
Monocytes Relative: 8.3 % (ref 3.0–12.0)
Neutro Abs: 3.9 10*3/uL (ref 1.4–7.7)
Neutrophils Relative %: 60.7 % (ref 43.0–77.0)
Platelets: 231 10*3/uL (ref 150.0–400.0)
RBC: 5.16 Mil/uL (ref 4.22–5.81)
RDW: 14.1 % (ref 11.5–15.5)
WBC: 6.5 10*3/uL (ref 4.0–10.5)

## 2020-10-25 NOTE — Progress Notes (Signed)
PFT done today. 

## 2020-10-25 NOTE — Addendum Note (Signed)
Addended by: Suzzanne Cloud E on: 10/25/2020 01:55 PM   Modules accepted: Orders

## 2020-10-27 ENCOUNTER — Other Ambulatory Visit: Payer: Self-pay | Admitting: Critical Care Medicine

## 2020-10-27 DIAGNOSIS — R0600 Dyspnea, unspecified: Secondary | ICD-10-CM

## 2020-10-27 DIAGNOSIS — R0609 Other forms of dyspnea: Secondary | ICD-10-CM

## 2020-10-27 NOTE — Progress Notes (Signed)
Please let Harold Robinson know that so far his PFTs and blood work do not explain his shortness of breath. He does not have PFTs or blood work that would suggest that injectable asthma medicines would be beneficial. He does not have anemia or anything else that would explain this. The next thing to check is an echocardiogram to make sure his heart is not what is causing him to have significant shortness of breath.  Thanks! LPC

## 2020-10-28 LAB — IGE: IgE (Immunoglobulin E), Serum: 553 kU/L — ABNORMAL HIGH (ref <=114)

## 2020-10-28 NOTE — Progress Notes (Signed)
Please let Harold Robinson know that although he does not appear to have uncontrolled asthma based on his PFTs, his blood level relating to antibodies involved with allergies is high. He may benefit from being evaluated by an allergist. If he is ok with it, please place a referral to allergy and immunology. Thank! LPC

## 2020-11-05 ENCOUNTER — Other Ambulatory Visit: Payer: BC Managed Care – PPO

## 2020-11-05 DIAGNOSIS — Z20822 Contact with and (suspected) exposure to covid-19: Secondary | ICD-10-CM

## 2020-11-07 LAB — SARS-COV-2, NAA 2 DAY TAT

## 2020-11-07 LAB — NOVEL CORONAVIRUS, NAA: SARS-CoV-2, NAA: NOT DETECTED

## 2020-11-08 ENCOUNTER — Encounter (HOSPITAL_COMMUNITY): Payer: Self-pay | Admitting: Critical Care Medicine

## 2020-11-12 ENCOUNTER — Other Ambulatory Visit: Payer: BC Managed Care – PPO

## 2020-11-12 DIAGNOSIS — Z20822 Contact with and (suspected) exposure to covid-19: Secondary | ICD-10-CM

## 2020-11-14 LAB — NOVEL CORONAVIRUS, NAA: SARS-CoV-2, NAA: NOT DETECTED

## 2020-11-14 LAB — SARS-COV-2, NAA 2 DAY TAT

## 2020-11-17 ENCOUNTER — Other Ambulatory Visit: Payer: Self-pay | Admitting: Internal Medicine

## 2020-11-17 DIAGNOSIS — I10 Essential (primary) hypertension: Secondary | ICD-10-CM

## 2020-11-18 ENCOUNTER — Encounter (HOSPITAL_COMMUNITY): Payer: Self-pay | Admitting: Critical Care Medicine

## 2020-11-19 ENCOUNTER — Other Ambulatory Visit: Payer: BC Managed Care – PPO

## 2020-11-19 DIAGNOSIS — Z20822 Contact with and (suspected) exposure to covid-19: Secondary | ICD-10-CM

## 2020-11-20 LAB — SARS-COV-2, NAA 2 DAY TAT

## 2020-11-20 LAB — NOVEL CORONAVIRUS, NAA: SARS-CoV-2, NAA: NOT DETECTED

## 2020-11-26 ENCOUNTER — Other Ambulatory Visit: Payer: BC Managed Care – PPO

## 2020-11-26 DIAGNOSIS — Z20822 Contact with and (suspected) exposure to covid-19: Secondary | ICD-10-CM

## 2020-11-28 LAB — SARS-COV-2, NAA 2 DAY TAT

## 2020-11-28 LAB — NOVEL CORONAVIRUS, NAA: SARS-CoV-2, NAA: NOT DETECTED

## 2020-12-04 ENCOUNTER — Other Ambulatory Visit: Payer: Self-pay | Admitting: Internal Medicine

## 2020-12-04 DIAGNOSIS — I10 Essential (primary) hypertension: Secondary | ICD-10-CM

## 2020-12-11 ENCOUNTER — Other Ambulatory Visit: Payer: Self-pay

## 2020-12-11 DIAGNOSIS — Z20822 Contact with and (suspected) exposure to covid-19: Secondary | ICD-10-CM

## 2020-12-13 LAB — NOVEL CORONAVIRUS, NAA: SARS-CoV-2, NAA: NOT DETECTED

## 2020-12-13 LAB — SARS-COV-2, NAA 2 DAY TAT

## 2020-12-18 ENCOUNTER — Other Ambulatory Visit: Payer: Self-pay

## 2020-12-18 DIAGNOSIS — Z20822 Contact with and (suspected) exposure to covid-19: Secondary | ICD-10-CM

## 2020-12-19 LAB — SARS-COV-2, NAA 2 DAY TAT

## 2020-12-19 LAB — NOVEL CORONAVIRUS, NAA: SARS-CoV-2, NAA: NOT DETECTED

## 2020-12-23 ENCOUNTER — Encounter (HOSPITAL_COMMUNITY): Payer: Self-pay

## 2020-12-24 ENCOUNTER — Other Ambulatory Visit: Payer: Self-pay

## 2020-12-24 ENCOUNTER — Other Ambulatory Visit (HOSPITAL_COMMUNITY): Payer: BC Managed Care – PPO

## 2021-01-03 ENCOUNTER — Other Ambulatory Visit: Payer: Self-pay | Admitting: Internal Medicine

## 2021-01-14 ENCOUNTER — Other Ambulatory Visit: Payer: Self-pay

## 2021-01-14 ENCOUNTER — Ambulatory Visit (HOSPITAL_COMMUNITY): Payer: Self-pay | Attending: Internal Medicine

## 2021-01-14 DIAGNOSIS — R0609 Other forms of dyspnea: Secondary | ICD-10-CM

## 2021-01-14 DIAGNOSIS — R0600 Dyspnea, unspecified: Secondary | ICD-10-CM | POA: Insufficient documentation

## 2021-01-14 LAB — ECHOCARDIOGRAM COMPLETE
Area-P 1/2: 3.77 cm2
P 1/2 time: 442 msec
S' Lateral: 3.8 cm

## 2021-01-15 ENCOUNTER — Other Ambulatory Visit: Payer: Self-pay

## 2021-01-15 DIAGNOSIS — Z20822 Contact with and (suspected) exposure to covid-19: Secondary | ICD-10-CM

## 2021-01-15 NOTE — Progress Notes (Signed)
Attempted to call patient to go over Echo results per Dr Carlis Abbott with patient. No answer and unable to leave message on voicemail. Will attempt to call again at another time.

## 2021-01-15 NOTE — Progress Notes (Signed)
Please let Mr. Eschmann know that his echocardiogram shows that his heart is functioning well. He has some mild thickening of the left side of his heart that sometimes can explain why people get short of breath when they move around. This is hard to fix unfortunately. Luckily the heart is squeezing well. Thanks! LPC

## 2021-01-16 ENCOUNTER — Other Ambulatory Visit: Payer: Self-pay | Admitting: Internal Medicine

## 2021-01-16 DIAGNOSIS — F32A Depression, unspecified: Secondary | ICD-10-CM

## 2021-01-16 LAB — SARS-COV-2, NAA 2 DAY TAT

## 2021-01-16 LAB — NOVEL CORONAVIRUS, NAA: SARS-CoV-2, NAA: NOT DETECTED

## 2021-01-18 NOTE — Progress Notes (Signed)
He can go see a cardiologist. The mainstay is controlling BP. Diet changes can help-- low sodium and focusing on a plant based diet. Thanks!  LPC

## 2021-01-21 ENCOUNTER — Other Ambulatory Visit: Payer: Self-pay

## 2021-01-21 DIAGNOSIS — Z20822 Contact with and (suspected) exposure to covid-19: Secondary | ICD-10-CM

## 2021-01-22 LAB — SARS-COV-2, NAA 2 DAY TAT

## 2021-01-22 LAB — NOVEL CORONAVIRUS, NAA: SARS-CoV-2, NAA: NOT DETECTED

## 2021-01-31 ENCOUNTER — Other Ambulatory Visit: Payer: Self-pay

## 2021-01-31 ENCOUNTER — Other Ambulatory Visit: Payer: Self-pay | Admitting: Internal Medicine

## 2021-01-31 DIAGNOSIS — Z20822 Contact with and (suspected) exposure to covid-19: Secondary | ICD-10-CM

## 2021-01-31 DIAGNOSIS — F32A Depression, unspecified: Secondary | ICD-10-CM

## 2021-01-31 DIAGNOSIS — F419 Anxiety disorder, unspecified: Secondary | ICD-10-CM

## 2021-02-01 LAB — SARS-COV-2, NAA 2 DAY TAT

## 2021-02-01 LAB — NOVEL CORONAVIRUS, NAA: SARS-CoV-2, NAA: NOT DETECTED

## 2021-02-07 ENCOUNTER — Other Ambulatory Visit: Payer: Self-pay

## 2021-03-11 ENCOUNTER — Other Ambulatory Visit: Payer: Self-pay | Admitting: Internal Medicine

## 2021-03-11 DIAGNOSIS — I10 Essential (primary) hypertension: Secondary | ICD-10-CM

## 2021-04-07 ENCOUNTER — Other Ambulatory Visit: Payer: Self-pay | Admitting: Internal Medicine

## 2021-05-02 ENCOUNTER — Ambulatory Visit: Payer: Self-pay | Admitting: Internal Medicine

## 2021-05-20 ENCOUNTER — Ambulatory Visit: Payer: Self-pay | Admitting: Internal Medicine

## 2021-06-05 ENCOUNTER — Ambulatory Visit (INDEPENDENT_AMBULATORY_CARE_PROVIDER_SITE_OTHER): Payer: Self-pay | Admitting: Internal Medicine

## 2021-06-05 ENCOUNTER — Other Ambulatory Visit: Payer: Self-pay

## 2021-06-05 ENCOUNTER — Encounter: Payer: Self-pay | Admitting: Internal Medicine

## 2021-06-05 VITALS — BP 140/80 | HR 71 | Temp 96.9°F | Ht 71.0 in | Wt 244.0 lb

## 2021-06-05 DIAGNOSIS — R0609 Other forms of dyspnea: Secondary | ICD-10-CM | POA: Insufficient documentation

## 2021-06-05 DIAGNOSIS — R0600 Dyspnea, unspecified: Secondary | ICD-10-CM

## 2021-06-05 DIAGNOSIS — I272 Pulmonary hypertension, unspecified: Secondary | ICD-10-CM

## 2021-06-05 HISTORY — DX: Other forms of dyspnea: R06.09

## 2021-06-05 NOTE — Assessment & Plan Note (Signed)
Ongoing issues Pulmonary felt it was likely heart from CT and PFTs and lack of response to inhaler Rx Does need to see cardiology Could be CAD

## 2021-06-05 NOTE — Assessment & Plan Note (Signed)
Mild per echo but I suspect it may be worse Probably needs right and left heart caths Should be checked for sleep apnea

## 2021-06-05 NOTE — Progress Notes (Signed)
Subjective:    Patient ID: Harold Robinson, male    DOB: 20-Aug-1971, 50 y.o.   MRN: [id number]  HPI Here with wife due to concerns about breathing This visit occurred during the SARS-CoV-2 public health emergency.  Safety protocols were in place, including screening questions prior to the visit, additional usage of staff PPE, and extensive cleaning of exam room while observing appropriate contact time as indicated for disinfecting solutions.   Had been having trouble breathing for over a year PFTs last fall --and CT scan Tried advair---this didn't help No longer using any inhaler Rx---never helped  Still with very easy DOE Some chest pain in the past---not recent Some dizziness if he pushes No edema  Current Outpatient Medications on File Prior to Visit  Medication Sig Dispense Refill   atorvastatin (LIPITOR) 20 MG tablet Take 1 tablet (20 mg total) by mouth daily. 90 tablet 3   losartan (COZAAR) 50 MG tablet TAKE 1 & 1/2 TABLETS (75 MG TOTAL) BY MOUTH DAILY. MUST SCHEDULE PHYSICAL 135 tablet 0   metoprolol succinate (TOPROL-XL) 50 MG 24 hr tablet TAKE 1 TABLET (50 MG TOTAL) BY MOUTH DAILY. 90 tablet 3   montelukast (SINGULAIR) 10 MG tablet TAKE 1 TABLET BY MOUTH EVERYDAY AT BEDTIME 90 tablet 0   sertraline (ZOLOFT) 100 MG tablet TAKE 1 TABLET (100 MG TOTAL) BY MOUTH DAILY. SCHEDULE PHYSICAL EXAM 30 tablet 0   No current facility-administered medications on file prior to visit.    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"   Esophagitis    Hiatal hernia    Hyperlipidemia    Hypertension    Neck pain    Pleurisy     Past Surgical History:  Procedure Laterality Date   ORIF TIBIA PLATEAU Left 12/11/2016   Procedure: OPEN REDUCTION INTERNAL FIXATION (ORIF) LEFT TIBIAL PLATEAU FRACTURE;  Surgeon: Mcarthur Rossetti, MD;  Location: [facility];  Service: Orthopedics;   Laterality: Left;   WISDOM TOOTH EXTRACTION      Family History  Problem Relation Age of Onset   Heart disease Father    Stroke Father    Heart disease Paternal Aunt    Heart disease Paternal Uncle    Hypertension Mother    Asthma Mother    Allergies Mother    Breast cancer Sister    Colon cancer Neg Hx    Esophageal cancer Neg Hx    Rectal cancer Neg Hx    Stomach cancer Neg Hx     Social History   Socioeconomic History   Marital status: Married    Spouse name: Not on file   Number of children: 1   Years of education: Not on file   Highest education level: Not on file  Occupational History   Occupation: Company secretary: [employer]  Tobacco Use   Smoking status: Never   Smokeless tobacco: Never  Vaping Use   Vaping Use: Never used  Substance and Sexual Activity   Alcohol use: No   Drug use: No   Sexual activity: Not on file  Other Topics Concern   Not on file  Social History Narrative   Not on file   Social Determinants of Health   Financial Resource Strain: Not on file  Food Insecurity: Not on file  Transportation Needs: Not  on file  Physical Activity: Not on file  Stress: Not on file  Social Connections: Not on file  Intimate Partner Violence: Not on file   Review of Systems Wife notes choking at night----no clear apnea    Objective:   Physical Exam Constitutional:      Appearance: Normal appearance.  Cardiovascular:     Rate and Rhythm: Normal rate and regular rhythm.     Pulses: Normal pulses.     Heart sounds:    No gallop.     Comments: Soft systolic murmur Pulmonary:     Effort: Pulmonary effort is normal.     Breath sounds: Normal breath sounds. No wheezing, rhonchi or rales.  Abdominal:     Palpations: Abdomen is soft.     Tenderness: There is no abdominal tenderness.  Musculoskeletal:     Cervical back: Neck supple.     Right lower leg: No edema.     Left lower leg: No edema.  Lymphadenopathy:     Cervical: No  cervical adenopathy.  Neurological:     Mental Status: He is alert.           Assessment & Plan:

## 2021-06-11 ENCOUNTER — Other Ambulatory Visit: Payer: Self-pay

## 2021-06-11 ENCOUNTER — Encounter (HOSPITAL_COMMUNITY): Payer: Self-pay

## 2021-06-11 ENCOUNTER — Inpatient Hospital Stay (HOSPITAL_COMMUNITY)
Admission: EM | Admit: 2021-06-11 | Discharge: 2021-06-15 | DRG: 419 | Disposition: A | Discharge status: Home / Self Care | Payer: BC Managed Care – PPO | Attending: Internal Medicine | Admitting: Internal Medicine

## 2021-06-11 ENCOUNTER — Emergency Department (HOSPITAL_COMMUNITY): Payer: BC Managed Care – PPO

## 2021-06-11 DIAGNOSIS — K8001 Calculus of gallbladder with acute cholecystitis with obstruction: Secondary | ICD-10-CM | POA: Diagnosis not present

## 2021-06-11 DIAGNOSIS — R0789 Other chest pain: Secondary | ICD-10-CM | POA: Diagnosis present

## 2021-06-11 DIAGNOSIS — E785 Hyperlipidemia, unspecified: Secondary | ICD-10-CM | POA: Diagnosis present

## 2021-06-11 DIAGNOSIS — I272 Pulmonary hypertension, unspecified: Secondary | ICD-10-CM | POA: Diagnosis present

## 2021-06-11 DIAGNOSIS — E7800 Pure hypercholesterolemia, unspecified: Secondary | ICD-10-CM

## 2021-06-11 DIAGNOSIS — Z825 Family history of asthma and other chronic lower respiratory diseases: Secondary | ICD-10-CM

## 2021-06-11 DIAGNOSIS — I160 Hypertensive urgency: Secondary | ICD-10-CM | POA: Diagnosis present

## 2021-06-11 DIAGNOSIS — I200 Unstable angina: Secondary | ICD-10-CM

## 2021-06-11 DIAGNOSIS — E782 Mixed hyperlipidemia: Secondary | ICD-10-CM

## 2021-06-11 DIAGNOSIS — Z79899 Other long term (current) drug therapy: Secondary | ICD-10-CM

## 2021-06-11 DIAGNOSIS — I2510 Atherosclerotic heart disease of native coronary artery without angina pectoris: Secondary | ICD-10-CM | POA: Diagnosis present

## 2021-06-11 DIAGNOSIS — Z803 Family history of malignant neoplasm of breast: Secondary | ICD-10-CM

## 2021-06-11 DIAGNOSIS — R0602 Shortness of breath: Secondary | ICD-10-CM

## 2021-06-11 DIAGNOSIS — I10 Essential (primary) hypertension: Secondary | ICD-10-CM | POA: Diagnosis not present

## 2021-06-11 DIAGNOSIS — I249 Acute ischemic heart disease, unspecified: Secondary | ICD-10-CM

## 2021-06-11 DIAGNOSIS — R079 Chest pain, unspecified: Secondary | ICD-10-CM | POA: Diagnosis not present

## 2021-06-11 DIAGNOSIS — Z8616 Personal history of COVID-19: Secondary | ICD-10-CM

## 2021-06-11 DIAGNOSIS — Z6834 Body mass index (BMI) 34.0-34.9, adult: Secondary | ICD-10-CM

## 2021-06-11 DIAGNOSIS — R109 Unspecified abdominal pain: Secondary | ICD-10-CM | POA: Diagnosis present

## 2021-06-11 DIAGNOSIS — Z823 Family history of stroke: Secondary | ICD-10-CM

## 2021-06-11 DIAGNOSIS — I340 Nonrheumatic mitral (valve) insufficiency: Secondary | ICD-10-CM | POA: Diagnosis present

## 2021-06-11 DIAGNOSIS — Z8249 Family history of ischemic heart disease and other diseases of the circulatory system: Secondary | ICD-10-CM

## 2021-06-11 DIAGNOSIS — F32A Depression, unspecified: Secondary | ICD-10-CM | POA: Diagnosis present

## 2021-06-11 LAB — CBC WITH DIFFERENTIAL/PLATELET
Abs Immature Granulocytes: 0.09 10*3/uL — ABNORMAL HIGH (ref 0.00–0.07)
Basophils Absolute: 0 10*3/uL (ref 0.0–0.1)
Basophils Relative: 0 %
Eosinophils Absolute: 0 10*3/uL (ref 0.0–0.5)
Eosinophils Relative: 0 %
HCT: 42.2 % (ref 39.0–52.0)
Hemoglobin: 14.1 g/dL (ref 13.0–17.0)
Immature Granulocytes: 1 %
Lymphocytes Relative: 5 %
Lymphs Abs: 0.8 10*3/uL (ref 0.7–4.0)
MCH: 29.7 pg (ref 26.0–34.0)
MCHC: 33.4 g/dL (ref 30.0–36.0)
MCV: 88.8 fL (ref 80.0–100.0)
Monocytes Absolute: 0.8 10*3/uL (ref 0.1–1.0)
Monocytes Relative: 5 %
Neutro Abs: 13.2 10*3/uL — ABNORMAL HIGH (ref 1.7–7.7)
Neutrophils Relative %: 89 %
Platelets: 233 10*3/uL (ref 150–400)
RBC: 4.75 MIL/uL (ref 4.22–5.81)
RDW: 13.2 % (ref 11.5–15.5)
WBC: 14.9 10*3/uL — ABNORMAL HIGH (ref 4.0–10.5)
nRBC: 0 % (ref 0.0–0.2)

## 2021-06-11 LAB — URINALYSIS, ROUTINE W REFLEX MICROSCOPIC
Bilirubin Urine: NEGATIVE
Glucose, UA: NEGATIVE mg/dL
Hgb urine dipstick: NEGATIVE
Ketones, ur: NEGATIVE mg/dL
Leukocytes,Ua: NEGATIVE
Nitrite: NEGATIVE
Protein, ur: NEGATIVE mg/dL
Specific Gravity, Urine: >1.046 — ABNORMAL HIGH (ref 1.005–1.030)
pH: 5 (ref 5.0–8.0)

## 2021-06-11 LAB — COMPREHENSIVE METABOLIC PANEL
ALT: 36 U/L (ref 0–44)
AST: 23 U/L (ref 15–41)
Albumin: 3.7 g/dL (ref 3.5–5.0)
Alkaline Phosphatase: 63 U/L (ref 38–126)
Anion gap: 8 (ref 5–15)
BUN: 12 mg/dL (ref 6–20)
CO2: 24 mmol/L (ref 22–32)
Calcium: 8.9 mg/dL (ref 8.9–10.3)
Chloride: 102 mmol/L (ref 98–111)
Creatinine, Ser: 1.11 mg/dL (ref 0.61–1.24)
GFR, Estimated: >60 mL/min (ref >60)
Glucose, Bld: 130 mg/dL — ABNORMAL HIGH (ref 70–99)
Potassium: 3.9 mmol/L (ref 3.5–5.1)
Sodium: 134 mmol/L — ABNORMAL LOW (ref 135–145)
Total Bilirubin: 1.3 mg/dL — ABNORMAL HIGH (ref 0.3–1.2)
Total Protein: 6.7 g/dL (ref 6.5–8.1)

## 2021-06-11 LAB — I-STAT CHEM 8, ED
BUN: 12 mg/dL (ref 6–20)
Calcium, Ion: 1.11 mmol/L — ABNORMAL LOW (ref 1.15–1.40)
Chloride: 104 mmol/L (ref 98–111)
Creatinine, Ser: 1 mg/dL (ref 0.61–1.24)
Glucose, Bld: 132 mg/dL — ABNORMAL HIGH (ref 70–99)
HCT: 43 % (ref 39.0–52.0)
Hemoglobin: 14.6 g/dL (ref 13.0–17.0)
Potassium: 3.8 mmol/L (ref 3.5–5.1)
Sodium: 139 mmol/L (ref 135–145)
TCO2: 23 mmol/L (ref 22–32)

## 2021-06-11 LAB — RESP PANEL BY RT-PCR (FLU A&B, COVID) ARPGX2
Influenza A by PCR: NEGATIVE
Influenza B by PCR: NEGATIVE
SARS Coronavirus 2 by RT PCR: NEGATIVE

## 2021-06-11 LAB — TROPONIN I (HIGH SENSITIVITY)
Troponin I (High Sensitivity): 5 ng/L (ref <18)
Troponin I (High Sensitivity): 4 ng/L (ref <18)

## 2021-06-11 MED ORDER — ACETAMINOPHEN 325 MG PO TABS: 650.0000 mg | ORAL_TABLET | ORAL | Status: DC | PRN

## 2021-06-11 MED ORDER — MORPHINE SULFATE (PF) 4 MG/ML IV SOLN
4.0000 mg | Freq: Once | INTRAVENOUS | Status: AC
  Administered 2021-06-11: 17:00:00 4 mg via INTRAVENOUS
  Filled 2021-06-11: qty 1

## 2021-06-11 MED ORDER — LOSARTAN POTASSIUM 50 MG PO TABS
75.0000 mg | ORAL_TABLET | Freq: Every day | ORAL | Status: DC
  Administered 2021-06-11 – 2021-06-13 (×3): 75 mg via ORAL
  Filled 2021-06-11 (×4): qty 1

## 2021-06-11 MED ORDER — NITROGLYCERIN IN D5W 200-5 MCG/ML-% IV SOLN
0.0000 ug/min | INTRAVENOUS | Status: DC
  Administered 2021-06-11: 5 ug/min via INTRAVENOUS
  Filled 2021-06-11: qty 250

## 2021-06-11 MED ORDER — ONDANSETRON HCL 4 MG/2ML IJ SOLN
4.0000 mg | Freq: Once | INTRAMUSCULAR | Status: AC
  Administered 2021-06-11: 17:00:00 4 mg via INTRAVENOUS
  Filled 2021-06-11: qty 2

## 2021-06-11 MED ORDER — ENOXAPARIN SODIUM 40 MG/0.4ML IJ SOSY
40.0000 mg | PREFILLED_SYRINGE | INTRAMUSCULAR | Status: DC
  Administered 2021-06-11: 40 mg via SUBCUTANEOUS
  Filled 2021-06-11: qty 0.4

## 2021-06-11 MED ORDER — HYDRALAZINE HCL 20 MG/ML IJ SOLN: 10.0000 mg | INTRAMUSCULAR | Status: DC | PRN

## 2021-06-11 MED ORDER — IOHEXOL 350 MG/ML SOLN
100.0000 mL | Freq: Once | INTRAVENOUS | Status: AC | PRN
  Administered 2021-06-11: 100 mL via INTRAVENOUS

## 2021-06-11 MED ORDER — MONTELUKAST SODIUM 10 MG PO TABS
10.0000 mg | ORAL_TABLET | Freq: Every day | ORAL | Status: DC
  Administered 2021-06-11 – 2021-06-14 (×4): 10 mg via ORAL
  Filled 2021-06-11 (×5): qty 1

## 2021-06-11 MED ORDER — SERTRALINE HCL 100 MG PO TABS
100.0000 mg | ORAL_TABLET | Freq: Every day | ORAL | Status: DC
  Administered 2021-06-11 – 2021-06-14 (×4): 100 mg via ORAL
  Filled 2021-06-11 (×5): qty 1

## 2021-06-11 MED ORDER — ATORVASTATIN CALCIUM 10 MG PO TABS
20.0000 mg | ORAL_TABLET | Freq: Every day | ORAL | Status: DC
  Administered 2021-06-11: 20 mg via ORAL
  Filled 2021-06-11: qty 2

## 2021-06-11 MED ORDER — METOPROLOL SUCCINATE ER 50 MG PO TB24
50.0000 mg | ORAL_TABLET | Freq: Every day | ORAL | Status: DC
  Administered 2021-06-11: 50 mg via ORAL
  Filled 2021-06-11: qty 1

## 2021-06-11 MED ORDER — ONDANSETRON HCL 4 MG/2ML IJ SOLN: 4.0000 mg | Freq: Four times a day (QID) | INTRAMUSCULAR | Status: DC | PRN

## 2021-06-11 NOTE — ED Triage Notes (Signed)
Pt arrived to ED via EMS from the [employer] w/ c/o chest tightness and pressure onset 0730 today w/ radiation to back and L shoulder, N/V, shob. EMS reports pt was pale and diaphoretic. EMS gave 1 SL nitro and 324mg  ASA. EMS report the nitro brought pain from a 20 to a 3. Initial BP 220/110, Last BP 140/84. EMS placed pt on 2L O2 for comfort. 18g L AC. Pt reports he is supposed to have a cardiac catheterization soon.

## 2021-06-11 NOTE — Consult Note (Signed)
Cardiology Consultation:   Patient ID: TYMOTHY CASS MRN: [id number]; DOB: 01-26-1971  Admit date: 06/11/2021 Date of Consult: 06/11/2021  PCP:  Merryl Hacker, No   [facility] Providers Cardiologist:  New    Patient Profile:   Harold Robinson is a 50 y.o. male with a hx of hypertension, hyperlipidemia and morbid obesity who is being seen 06/11/2021 for the evaluation of chest pain at the request of Dr. Alcario Drought.  History of Present Illness:   Harold Robinson having shortness of breath and chest tightness for past 1 year.  Initially occurred with heavy exertional activity.  Progressively worsened.  Now occurring with rest.  He is compliant with his medication.  He was seen by Dr. Silvio Pate on 6/30 for ongoing symptoms and plan to establish cardiac care and cardiac catheterization.  However he had worse episode of chest pain this morning.  He was sitting and started to having substernal chest pressure radiating to left arm.  This was associated with diaphoresis, nausea and shortness of breath.  Rest make it better.  Exertional make it worse.  Due to ongoing symptoms he came in for further evaluation. Chest tightness improving on nitro drip.   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"   Esophagitis    Hiatal hernia    Hyperlipidemia    Hypertension    Neck pain    Pleurisy     Past Surgical History:  Procedure Laterality Date   ORIF TIBIA PLATEAU Left 12/11/2016   Procedure: OPEN REDUCTION INTERNAL FIXATION (ORIF) LEFT TIBIAL PLATEAU FRACTURE;  Surgeon: Mcarthur Rossetti, MD;  Location: [facility];  Service: Orthopedics;  Laterality: Left;   WISDOM TOOTH EXTRACTION        Inpatient Medications: Scheduled Meds:   atorvastatin  20 mg Oral QHS   enoxaparin (LOVENOX) injection  40 mg Subcutaneous Q24H   losartan  75 mg Oral QHS   metoprolol succinate  50 mg Oral QHS   montelukast  10 mg Oral QHS   sertraline  100 mg Oral QHS   Continuous Infusions:  nitroGLYCERIN 5 mcg/min (06/11/21 1856)   PRN Meds: acetaminophen, ondansetron (ZOFRAN) IV  Allergies:   No Known Allergies  Social History:   Social History   Socioeconomic History   Marital status: Married    Spouse name: Not on file   Number of children: 1   Years of education: Not on file   Highest education level: Not on file  Occupational History   Occupation: Company secretary: [employer]  Tobacco Use   Smoking status: Never   Smokeless tobacco: Never  Vaping Use   Vaping Use: Never used  Substance and Sexual Activity   Alcohol use: No   Drug use: No   Sexual activity: Not on file  Other Topics Concern   Not on file  Social History Narrative   Not on file   Social Determinants of Health   Financial Resource Strain: Not on file  Food Insecurity: Not on file  Transportation Needs: Not on file  Physical Activity: Not on file  Stress: Not on file  Social Connections: Not on file  Intimate Partner Violence: Not on file    Family History:    Family History  Problem Relation Age of Onset   Heart disease Father    Stroke Father    Heart disease Paternal Aunt    Heart disease Paternal Uncle    Hypertension Mother    Asthma Mother    Allergies Mother    Breast cancer Sister    Colon cancer Neg Hx    Esophageal cancer Neg Hx    Rectal cancer Neg Hx    Stomach cancer Neg Hx      ROS:  Please see the history of present illness.  All other ROS reviewed and negative.     Physical Exam/Data:   Vitals:   06/11/21 1845 06/11/21 1900 06/11/21 1906 06/11/21 1909  BP: (!) 162/88 (!) 161/88 (!) 166/93 (!) 162/95  Pulse: 70 69 68 69  Resp: 17 14 17 14   Temp:      TempSrc:      SpO2: 100% 100% 99% 99%   Weight:      Height:       No intake or output data in the 24 hours ending 06/11/21 2000 Last 3 Weights 06/11/2021 06/05/2021 10/23/2020  Weight (lbs) 248 lb 244 lb 242 lb  Weight (kg) 112.492 kg 110.678 kg 109.77 kg     Body mass index is 34.59 kg/m.  General:  Well nourished, well developed, in no acute distress, moderately obese HEENT: normal Lymph: no adenopathy Neck: no JVD Endocrine:  No thryomegaly Vascular: No carotid bruits; FA pulses 2+ bilaterally without bruits  Cardiac:  normal S1, S2; RRR; + murmur 2/6 SEM at LLSB Lungs:  clear to auscultation bilaterally, no wheezing, rhonchi or rales  Abd: soft, nontender, no hepatomegaly  Ext: no edema Musculoskeletal:  No deformities, BUE and BLE strength normal and equal Skin: warm and dry  Neuro:  CNs 2-12 intact, no focal abnormalities noted Psych:  Normal affect   EKG:  The EKG was personally reviewed and demonstrates:  sinus rhythm at 70, no ischemic changes  Telemetry:  Telemetry was personally reviewed and demonstrates:  sinus rhythm  Relevant CV Studies:  Echo 01/2021 1. Left ventricular ejection fraction, by estimation, is 60 to 65%. The  left ventricle has normal function. The left ventricle has no regional  wall motion abnormalities. Left ventricular diastolic parameters were  normal. The average left ventricular  global longitudinal strain is -20.1 %. The global longitudinal strain is  normal.   2. Right ventricular systolic function is normal. The right ventricular  size is normal. There is mildly elevated pulmonary artery systolic  pressure. The estimated right ventricular systolic pressure is 56.4 mmHg.   3. The mitral valve is abnormal. Mild mitral valve regurgitation.   4. The aortic valve is tricuspid. Aortic valve regurgitation is trivial.  Mild aortic valve sclerosis is present, with no evidence of aortic valve  stenosis.   5. The inferior vena cava is dilated in size with >50% respiratory   variability, suggesting right atrial pressure of 8 mmHg.   Laboratory Data:  High Sensitivity Troponin:   Recent Labs  Lab 06/11/21 1729  TROPONINIHS 5     Chemistry Recent Labs  Lab 06/11/21 1652 06/11/21 1729  NA 139 134*  K 3.8 3.9  CL 104 102  CO2  --  24  GLUCOSE 132* 130*  BUN 12 12  CREATININE 1.00 1.11  CALCIUM  --  8.9  GFRNONAA  --  >60  ANIONGAP  --  8    Recent Labs  Lab 06/11/21 1729  PROT 6.7  ALBUMIN 3.7  AST 23  ALT 36  ALKPHOS 63  BILITOT 1.3*   Hematology Recent Labs  Lab 06/11/21 1652 06/11/21 1729  WBC  --  14.9*  RBC  --  4.75  HGB 14.6 14.1  HCT 43.0 42.2  MCV  --  88.8  MCH  --  29.7  MCHC  --  33.4  RDW  --  13.2  PLT  --  233   BNPNo results for input(s): BNP, PROBNP in the last 168 hours.  DDimer No results for input(s): DDIMER in the last 168 hours.   Radiology/Studies:  CT Angio Chest/Abd/Pel for Dissection W and/or Wo Contrast  Result Date: 06/11/2021 CLINICAL DATA:  Chest pain radiating into the back EXAM: CT ANGIOGRAPHY CHEST, ABDOMEN AND PELVIS TECHNIQUE: Non-contrast CT of the chest was initially obtained. Multidetector CT imaging through the chest, abdomen and pelvis was performed using the standard protocol during bolus administration of intravenous contrast. Multiplanar reconstructed images and MIPs were obtained and reviewed to evaluate the vascular anatomy. CONTRAST:  192mL OMNIPAQUE IOHEXOL 350 MG/ML SOLN COMPARISON:  Chest CT 10/16/2020.  No interval chest imaging. FINDINGS: CTA CHEST FINDINGS Cardiovascular: No aortic dissection. No aortic hematoma on noncontrast exam. Normal caliber thoracic aorta. Common origin of the brachiocephalic and left common carotid artery there is no central pulmonary embolus to the segmental level. Heart is normal in size. No pericardial effusion. There are coronary artery calcifications. Mediastinum/Nodes: No mediastinal or hilar adenopathy. No thyroid nodule. Decompressed esophagus.  Lungs/Pleura: No acute airspace disease, pulmonary edema, or pleural effusion. Trachea and central bronchi are patent. Previous 2-3 mm pulmonary nodules are less well-defined on the current exam due to motion. Stable 3 mm right middle lobe nodule, series 7, image 48. Musculoskeletal: There are no acute or suspicious osseous abnormalities. Review of the MIP images confirms the above findings. CTA ABDOMEN AND PELVIS FINDINGS VASCULAR Aorta: Normal caliber aorta without aneurysm, dissection, vasculitis or significant stenosis. Minimal aortic atherosclerosis. Celiac: Patent without evidence of aneurysm, dissection, vasculitis or significant stenosis. SMA: Patent without evidence of aneurysm, dissection, vasculitis or significant stenosis. Replaced left hepatic artery arises from the SMA. Renals: 2 codominant right renal arteries. Single left renal artery. All renal arteries are patent. IMA: Patent without evidence of aneurysm, dissection, vasculitis or significant stenosis. Inflow: Patent without evidence of aneurysm, dissection, vasculitis or significant stenosis. Veins: No obvious venous abnormality within the limitations of this arterial phase study. No portal venous or mesenteric gas. Review of the MIP images confirms the above findings. NON-VASCULAR Hepatobiliary: Hepatomegaly and hepatic steatosis, the liver spans 23.8 cm cranial caudal. No focal hepatic lesion. Calcified gallstone within physiologically distended gallbladder. No pericholecystic inflammation. No biliary dilatation. Pancreas: No ductal dilatation or inflammation. Spleen: Normal in size and arterial enhancement. Small splenule anteriorly. Adrenals/Urinary Tract: Normal adrenal glands. No hydronephrosis or perinephric edema. There are small low-density lesions and within both kidneys, too small to accurately characterize but likely small cysts. Unremarkable urinary bladder. Stomach/Bowel: Decompressed stomach. There is no small bowel obstruction or  inflammation. Normal appendix. Submucosal fatty infiltration of the cecum, ascending, and transverse colon without acute inflammatory change. Scattered left colonic diverticulosis without diverticulitis. No acute colonic inflammation. Lymphatic: No bulky abdominopelvic adenopathy. Reproductive: Prostate is unremarkable. Other: No ascites or free air. Small fat containing inguinal hernias. Tiny fat containing umbilical hernia. 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 There are no acute or suspicious  osseous abnormalities. Review of the MIP images confirms the above findings. IMPRESSION: 1. No aortic dissection or acute aortic findings. Minimal aortic atherosclerosis. No pulmonary embolus. 2. No acute abnormality in the chest, abdomen, or pelvis. 3. Hepatomegaly and hepatic steatosis. 4. Cholelithiasis without gallbladder inflammation. 5. Colonic diverticulosis without diverticulitis. 6. 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 Aortic Atherosclerosis (ICD10-I70.0). Electronically Signed   By: Keith Rake M.D.   On: 06/11/2021 17:57     Assessment and Plan:   Chest pain-symptoms concerning for unstable angina however he has had ongoing symptoms for some time and negative work-up in the past.  He had a CT coronary angiogram back in 2010 which was negative for coronary disease.  Calcium score was 7.1.  High-res chest CT however in 2021 showed aortic atherosclerosis including left main and LAD coronary disease.  Symptoms included chest pain that radiated to his back.  CT angiogram during this admission was negative for dissection and showed minimal aortic atherosclerosis.  Troponins are negative x2.  He was initially quite hypertensive requiring nitroglycerin.  This could represent hypertensive urgency or unstable angina.  Would recommend keeping the patient n.p.o. after midnight for possible further ischemic evaluation including catheterization given CT evidence of left main  and LAD calcification for definitive coronary evaluation.  Thanks for the consultation.  For questions or updates, please contact [place] Please consult [email or web address] for contact info under   Pixie Casino, MD, FACC, [place] Director of the [facility] Diplomate of the American Board of Clinical Lipidology Attending Cardiologist  Direct Dial: [phone]  Fax: [phone]  Website:  [email or web address]

## 2021-06-11 NOTE — ED Notes (Signed)
Pt hooked to zoll and transported to CT at this time

## 2021-06-11 NOTE — ED Provider Notes (Signed)
[facility] EMERGENCY DEPARTMENT Provider Note   CSN: [id number] Arrival date & time: 06/11/21  1627     History Chief Complaint  Patient presents with   Chest Pain    Harold Robinson is a 50 y.o. male.  Pt presents to the ED today with cp and sob.  The pt started having cp today around 0730.  He tried to work, but it finally became so uncomfortable that he called EMS.  EMS gave him asa and nitro.  He looks very uncomfortable and said he feels bad. He said his pain radiates to his back.  He has been having sob and has seen pulmonology.  They feel that he has pulmonary hypertension, but needs a cath with cards.  He has not had this done yet.      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"   Esophagitis    Hiatal hernia    Hyperlipidemia    Hypertension    Neck pain    Pleurisy     Patient Active Problem List   Diagnosis Date Noted   Chest pain, rule out acute myocardial infarction 06/11/2021   DOE (dyspnea on exertion) 06/05/2021   Pulmonary hypertension ([place]) 06/05/2021   HLD (hyperlipidemia) 04/15/2020   OA (osteoarthritis) 04/15/2020   Anxiety and depression 04/15/2020   Essential hypertension 06/04/2009    Past Surgical History:  Procedure Laterality Date   ORIF TIBIA PLATEAU Left 12/11/2016   Procedure: OPEN REDUCTION INTERNAL FIXATION (ORIF) LEFT TIBIAL PLATEAU FRACTURE;  Surgeon: Mcarthur Rossetti, MD;  Location: [facility];  Service: Orthopedics;  Laterality: Left;   WISDOM TOOTH EXTRACTION         Family History  Problem Relation Age of Onset   Heart disease Father    Stroke Father    Heart disease Paternal Aunt    Heart disease Paternal Uncle    Hypertension Mother    Asthma Mother    Allergies Mother    Breast cancer Sister    Colon cancer Neg Hx    Esophageal cancer Neg Hx    Rectal cancer Neg Hx    Stomach cancer Neg Hx     Social  History   Tobacco Use   Smoking status: Never   Smokeless tobacco: Never  Vaping Use   Vaping Use: Never used  Substance Use Topics   Alcohol use: No   Drug use: No    Home Medications Prior to Admission medications   Medication Sig Start Date End Date Taking? Authorizing Provider  atorvastatin (LIPITOR) 20 MG tablet Take 1 tablet (20 mg total) by mouth daily. Patient taking differently: Take 20 mg by mouth at bedtime. 08/21/19  Yes Burchette, Alinda Sierras, MD  losartan (COZAAR) 50 MG tablet TAKE 1 & 1/2 TABLETS (75 MG TOTAL) BY MOUTH DAILY. MUST SCHEDULE PHYSICAL Patient taking differently: Take 75 mg by mouth at bedtime. 03/12/21  Yes Baity, Coralie Keens, NP  metoprolol succinate (TOPROL-XL) 50 MG 24 hr tablet TAKE 1 TABLET (50 MG TOTAL) BY MOUTH DAILY. Patient taking differently: Take 50 mg by mouth at bedtime. 08/21/19  Yes Burchette, Alinda Sierras, MD  montelukast (SINGULAIR) 10 MG tablet TAKE 1 TABLET BY MOUTH EVERYDAY AT BEDTIME Patient taking differently: Take 10 mg by mouth at bedtime. 04/08/21  Yes Dutch Quint B, FNP  sertraline (ZOLOFT) 100 MG tablet TAKE 1  TABLET (100 MG TOTAL) BY MOUTH DAILY. SCHEDULE PHYSICAL EXAM Patient taking differently: Take 100 mg by mouth at bedtime. 01/17/21  Yes Jearld Fenton, NP    Allergies    Patient has no known allergies.  Review of Systems   Review of Systems  Respiratory:  Positive for shortness of breath.   Cardiovascular:  Positive for chest pain.  All other systems reviewed and are negative.  Physical Exam Updated Vital Signs BP (!) 162/95   Pulse 69   Temp 97.9 F (36.6 C) (Oral)   Resp 14   Ht 5\' 11"  (1.803 m)   Wt 112.5 kg   SpO2 99%   BMI 34.59 kg/m   Physical Exam Vitals and nursing note reviewed.  Constitutional:      General: He is in acute distress.     Appearance: He is ill-appearing and diaphoretic.  HENT:     Head: Normocephalic and atraumatic.  Eyes:     Extraocular Movements: Extraocular movements intact.      Pupils: Pupils are equal, round, and reactive to light.  Cardiovascular:     Rate and Rhythm: Normal rate and regular rhythm.     Heart sounds: Normal heart sounds.  Pulmonary:     Effort: Pulmonary effort is normal.     Breath sounds: Normal breath sounds.  Abdominal:     General: Bowel sounds are normal.     Palpations: Abdomen is soft.  Musculoskeletal:        General: Normal range of motion.     Cervical back: Normal range of motion and neck supple.  Skin:    General: Skin is warm.     Capillary Refill: Capillary refill takes less than 2 seconds.  Neurological:     General: No focal deficit present.     Mental Status: He is alert and oriented to person, place, and time.  Psychiatric:        Mood and Affect: Mood normal.        Behavior: Behavior normal.    ED Results / Procedures / Treatments   Labs (all labs ordered are listed, but only abnormal results are displayed) Labs Reviewed  CBC WITH DIFFERENTIAL/PLATELET - Abnormal; Notable for the following components:      Result Value   WBC 14.9 (*)    Neutro Abs 13.2 (*)    Abs Immature Granulocytes 0.09 (*)    All other components within normal limits  COMPREHENSIVE METABOLIC PANEL - Abnormal; Notable for the following components:   Sodium 134 (*)    Glucose, Bld 130 (*)    Total Bilirubin 1.3 (*)    All other components within normal limits  URINALYSIS, ROUTINE W REFLEX MICROSCOPIC - Abnormal; Notable for the following components:   Specific Gravity, Urine >1.046 (*)    All other components within normal limits  I-STAT CHEM 8, ED - Abnormal; Notable for the following components:   Glucose, Bld 132 (*)    Calcium, [name] 1.11 (*)    All other components within normal limits  RESP PANEL BY RT-PCR (FLU A&B, COVID) ARPGX2  HIV ANTIBODY (ROUTINE TESTING W REFLEX)  TROPONIN I (HIGH SENSITIVITY)  TROPONIN I (HIGH SENSITIVITY)    EKG EKG Interpretation  Date/Time:  Wednesday June 11 2021 16:39:10 EDT Ventricular Rate:   70 PR Interval:  166 QRS Duration: 102 QT Interval:  399 QTC Calculation: 431 R Axis:   71 Text Interpretation: Sinus rhythm No significant change since last tracing Confirmed by Isla Pence (  [id number]) on 06/11/2021 5:10:14 PM  Radiology CT Angio Chest/Abd/Pel for Dissection W and/or Wo Contrast  Result Date: 06/11/2021 CLINICAL DATA:  Chest pain radiating into the back EXAM: CT ANGIOGRAPHY CHEST, ABDOMEN AND PELVIS TECHNIQUE: Non-contrast CT of the chest was initially obtained. Multidetector CT imaging through the chest, abdomen and pelvis was performed using the standard protocol during bolus administration of intravenous contrast. Multiplanar reconstructed images and MIPs were obtained and reviewed to evaluate the vascular anatomy. CONTRAST:  165mL OMNIPAQUE IOHEXOL 350 MG/ML SOLN COMPARISON:  Chest CT 10/16/2020.  No interval chest imaging. FINDINGS: CTA CHEST FINDINGS Cardiovascular: No aortic dissection. No aortic hematoma on noncontrast exam. Normal caliber thoracic aorta. Common origin of the brachiocephalic and left common carotid artery there is no central pulmonary embolus to the segmental level. Heart is normal in size. No pericardial effusion. There are coronary artery calcifications. Mediastinum/Nodes: No mediastinal or hilar adenopathy. No thyroid nodule. Decompressed esophagus. Lungs/Pleura: No acute airspace disease, pulmonary edema, or pleural effusion. Trachea and central bronchi are patent. Previous 2-3 mm pulmonary nodules are less well-defined on the current exam due to motion. Stable 3 mm right middle lobe nodule, series 7, image 48. Musculoskeletal: There are no acute or suspicious osseous abnormalities. Review of the MIP images confirms the above findings. CTA ABDOMEN AND PELVIS FINDINGS VASCULAR Aorta: Normal caliber aorta without aneurysm, dissection, vasculitis or significant stenosis. Minimal aortic atherosclerosis. Celiac: Patent without evidence of aneurysm, dissection,  vasculitis or significant stenosis. SMA: Patent without evidence of aneurysm, dissection, vasculitis or significant stenosis. Replaced left hepatic artery arises from the SMA. Renals: 2 codominant right renal arteries. Single left renal artery. All renal arteries are patent. IMA: Patent without evidence of aneurysm, dissection, vasculitis or significant stenosis. Inflow: Patent without evidence of aneurysm, dissection, vasculitis or significant stenosis. Veins: No obvious venous abnormality within the limitations of this arterial phase study. No portal venous or mesenteric gas. Review of the MIP images confirms the above findings. NON-VASCULAR Hepatobiliary: Hepatomegaly and hepatic steatosis, the liver spans 23.8 cm cranial caudal. No focal hepatic lesion. Calcified gallstone within physiologically distended gallbladder. No pericholecystic inflammation. No biliary dilatation. Pancreas: No ductal dilatation or inflammation. Spleen: Normal in size and arterial enhancement. Small splenule anteriorly. Adrenals/Urinary Tract: Normal adrenal glands. No hydronephrosis or perinephric edema. There are small low-density lesions and within both kidneys, too small to accurately characterize but likely small cysts. Unremarkable urinary bladder. Stomach/Bowel: Decompressed stomach. There is no small bowel obstruction or inflammation. Normal appendix. Submucosal fatty infiltration of the cecum, ascending, and transverse colon without acute inflammatory change. Scattered left colonic diverticulosis without diverticulitis. No acute colonic inflammation. Lymphatic: No bulky abdominopelvic adenopathy. Reproductive: Prostate is unremarkable. Other: No ascites or free air. Small fat containing inguinal hernias. Tiny fat containing umbilical hernia. 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 There are no acute or suspicious osseous abnormalities. Review of the MIP images confirms the above findings. IMPRESSION:  1. No aortic dissection or acute aortic findings. Minimal aortic atherosclerosis. No pulmonary embolus. 2. No acute abnormality in the chest, abdomen, or pelvis. 3. Hepatomegaly and hepatic steatosis. 4. Cholelithiasis without gallbladder inflammation. 5. Colonic diverticulosis without diverticulitis. 6. 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 Aortic Atherosclerosis (ICD10-I70.0). Electronically Signed   By: Keith Rake M.D.   On: 06/11/2021 17:57    Procedures Procedures   Medications Ordered in ED Medications  nitroGLYCERIN 50 mg in dextrose 5 % 250 mL (0.2 mg/mL) infusion (5 mcg/min Intravenous New Bag/Given 06/11/21 1856)  atorvastatin (LIPITOR)  tablet 20 mg (has no administration in time range)  losartan (COZAAR) tablet 75 mg (has no administration in time range)  metoprolol succinate (TOPROL-XL) 24 hr tablet 50 mg (has no administration in time range)  montelukast (SINGULAIR) tablet 10 mg (has no administration in time range)  sertraline (ZOLOFT) tablet 100 mg (has no administration in time range)  acetaminophen (TYLENOL) tablet 650 mg (has no administration in time range)  ondansetron (ZOFRAN) injection 4 mg (has no administration in time range)  enoxaparin (LOVENOX) injection 40 mg (has no administration in time range)  morphine 4 MG/ML injection 4 mg (4 mg Intravenous Given 06/11/21 1653)  ondansetron (ZOFRAN) injection 4 mg (4 mg Intravenous Given 06/11/21 1653)  iohexol (OMNIPAQUE) 350 MG/ML injection 100 mL (100 mLs Intravenous Contrast Given 06/11/21 1714)    ED Course  I have reviewed the triage vital signs and the nursing notes.  Pertinent labs & imaging results that were available during my care of the patient were reviewed by me and considered in my medical decision making (see chart for details).    MDM Rules/Calculators/A&P                         Pt's cp was going to his back, so he had a CTA for dissection, but this was negative.  Pt's EKG and trop  neg.  Pt d/w Dr. Debara Pickett (cards).  He requests medicine to admit and cards will consult.    Pt d/w Dr. Alcario Drought (triad) for admission.  CRITICAL CARE Performed by: Isla Pence   Total critical care time: 30 minutes  Critical care time was exclusive of separately billable procedures and treating other patients.  Critical care was necessary to treat or prevent imminent or life-threatening deterioration.  Critical care was time spent personally by me on the following activities: development of treatment plan with patient and/or surrogate as well as nursing, discussions with consultants, evaluation of patient's response to treatment, examination of patient, obtaining history from patient or surrogate, ordering and performing treatments and interventions, ordering and review of laboratory studies, ordering and review of radiographic studies, pulse oximetry and re-evaluation of patient's condition.  Final Clinical Impression(s) / ED Diagnoses Final diagnoses:  ACS (acute coronary syndrome) [facility])    Rx / DC Orders ED Discharge Orders     None        Isla Pence, MD 06/11/21 1931

## 2021-06-11 NOTE — H&P (Signed)
History and Physical    Harold Robinson KVQ:[id number] DOB: [birth year] DOA: 06/11/2021  PCP: Pcp, No  Patient coming from: Home  I have personally briefly reviewed patient's old medical records in [place]  Chief Complaint: CP  HPI: Harold Robinson is a 50 y.o. male with medical history significant of HLD, HTN.  Pt with DOE ever since having COVID x1 year ago.  Pulm cleared pt after PFTs were unimpressive.  2D echo in Feb = Mild PAH.  Pt presents to ED today with c/o CP and SOB.  CP onset 0730, tried to work but ultimately became so uncomfortable that he called EMS.  EMS gave ASA + NTG.  Reported initial BP 220/110.  Initial CP 10/10, improved to 3/10 after NTG.  CP radiates to back.  Associated SOB.  No abd pain, no vomiting.   ED Course: Trop neg.  CP improved on NTG GTT.  Cards consulted, hospitalist to admit.  CTA chest / abd / pelvis: 1) No aortic dissection 2) no PE 3) no acute findings   Review of Systems: As per HPI, otherwise all review of systems negative.  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 He has never used smokeless tobacco. He reports that he does not drink alcohol and does not use drugs.  No Known Allergies  Family History  Problem Relation Age of Onset   Heart disease Father    Stroke Father    Heart disease Paternal Aunt    Heart disease Paternal Uncle    Hypertension Mother    Asthma Mother    Allergies  Mother    Breast cancer Sister    Colon cancer Neg Hx    Esophageal cancer Neg Hx    Rectal cancer Neg Hx    Stomach cancer Neg Hx      Prior to Admission medications   Medication Sig Start Date End Date Taking? Authorizing Provider  atorvastatin (LIPITOR) 20 MG tablet Take 1 tablet (20 mg total) by mouth daily. Patient taking differently: Take 20 mg by mouth at bedtime. 08/21/19  Yes Burchette, Alinda Sierras, MD  losartan (COZAAR) 50 MG tablet TAKE 1 & 1/2 TABLETS (75 MG TOTAL) BY MOUTH DAILY. MUST SCHEDULE PHYSICAL Patient taking differently: Take 75 mg by mouth at bedtime. 03/12/21  Yes Baity, Coralie Keens, NP  metoprolol succinate (TOPROL-XL) 50 MG 24 hr tablet TAKE 1 TABLET (50 MG TOTAL) BY MOUTH DAILY. Patient taking differently: Take 50 mg by mouth at bedtime. 08/21/19  Yes Burchette, Alinda Sierras, MD  montelukast (SINGULAIR) 10 MG tablet TAKE 1 TABLET BY MOUTH EVERYDAY AT BEDTIME Patient taking differently: Take 10 mg by mouth at bedtime. 04/08/21  Yes Dutch Quint B, FNP  sertraline (ZOLOFT) 100 MG tablet TAKE 1 TABLET (100 MG TOTAL) BY MOUTH DAILY. SCHEDULE PHYSICAL EXAM Patient taking differently: Take 100 mg  by mouth at bedtime. 01/17/21  Yes Jearld Fenton, NP    Physical Exam: Vitals:   06/11/21 1845 06/11/21 1900 06/11/21 1906 06/11/21 1909  BP: (!) 162/88 (!) 161/88 (!) 166/93 (!) 162/95  Pulse: 70 69 68 69  Resp: 17 14 17 14   Temp:      TempSrc:      SpO2: 100% 100% 99% 99%  Weight:      Height:        Constitutional: NAD, calm, comfortable Eyes: PERRL, lids and conjunctivae normal ENMT: Mucous membranes are moist. Posterior pharynx clear of any exudate or lesions.Normal dentition.  Neck: normal, supple, no masses, no thyromegaly Respiratory: clear to auscultation bilaterally, no wheezing, no crackles. Normal respiratory effort. No accessory muscle use.  Cardiovascular: Regular rate and rhythm, no murmurs / rubs / gallops. No extremity edema. 2+ pedal pulses. No carotid bruits.   Abdomen: no tenderness, no masses palpated. No hepatosplenomegaly. Bowel sounds positive.  Musculoskeletal: no clubbing / cyanosis. No joint deformity upper and lower extremities. Good ROM, no contractures. Normal muscle tone.  Skin: no rashes, lesions, ulcers. No induration Neurologic: CN 2-12 grossly intact. Sensation intact, DTR normal. Strength 5/5 in all 4.  Psychiatric: Normal judgment and insight. Alert and oriented x 3. Normal mood.    Labs on Admission: I have personally reviewed following labs and imaging studies  CBC: Recent Labs  Lab 06/11/21 1652 06/11/21 1729  WBC  --  14.9*  NEUTROABS  --  13.2*  HGB 14.6 14.1  HCT 43.0 42.2  MCV  --  88.8  PLT  --  324   Basic Metabolic Panel: Recent Labs  Lab 06/11/21 1652 06/11/21 1729  NA 139 134*  K 3.8 3.9  CL 104 102  CO2  --  24  GLUCOSE 132* 130*  BUN 12 12  CREATININE 1.00 1.11  CALCIUM  --  8.9   GFR: Estimated Creatinine Clearance: 103.8 mL/min (by C-G formula based on SCr of 1.11 mg/dL). Liver Function Tests: Recent Labs  Lab 06/11/21 1729  AST 23  ALT 36  ALKPHOS 63  BILITOT 1.3*  PROT 6.7  ALBUMIN 3.7   No results for input(s): LIPASE, AMYLASE in the last 168 hours. No results for input(s): AMMONIA in the last 168 hours. Coagulation Profile: No results for input(s): INR, PROTIME in the last 168 hours. Cardiac Enzymes: No results for input(s): CKTOTAL, CKMB, CKMBINDEX, TROPONINI in the last 168 hours. BNP (last 3 results) No results for input(s): PROBNP in the last 8760 hours. HbA1C: No results for input(s): HGBA1C in the last 72 hours. CBG: No results for input(s): GLUCAP in the last 168 hours. Lipid Profile: No results for input(s): CHOL, HDL, LDLCALC, TRIG, CHOLHDL, LDLDIRECT in the last 72 hours. Thyroid Function Tests: No results for input(s): TSH, T4TOTAL, FREET4, T3FREE, THYROIDAB in the last 72 hours. Anemia Panel: No results for input(s): VITAMINB12, FOLATE, FERRITIN, TIBC, IRON,  RETICCTPCT in the last 72 hours. Urine analysis:    Component Value Date/Time   COLORURINE YELLOW 06/11/2021 1857   APPEARANCEUR CLEAR 06/11/2021 1857   LABSPEC >1.046 (H) 06/11/2021 1857   PHURINE 5.0 06/11/2021 1857   GLUCOSEU NEGATIVE 06/11/2021 1857   HGBUR NEGATIVE 06/11/2021 1857   BILIRUBINUR NEGATIVE 06/11/2021 1857   KETONESUR NEGATIVE 06/11/2021 1857   PROTEINUR NEGATIVE 06/11/2021 1857   UROBILINOGEN 0.2 08/07/2013 0005   NITRITE NEGATIVE 06/11/2021 1857   LEUKOCYTESUR NEGATIVE 06/11/2021 1857    Radiological Exams on Admission: CT Angio Chest/Abd/Pel for  Dissection W and/or Wo Contrast  Result Date: 06/11/2021 CLINICAL DATA:  Chest pain radiating into the back EXAM: CT ANGIOGRAPHY CHEST, ABDOMEN AND PELVIS TECHNIQUE: Non-contrast CT of the chest was initially obtained. Multidetector CT imaging through the chest, abdomen and pelvis was performed using the standard protocol during bolus administration of intravenous contrast. Multiplanar reconstructed images and MIPs were obtained and reviewed to evaluate the vascular anatomy. CONTRAST:  172mL OMNIPAQUE IOHEXOL 350 MG/ML SOLN COMPARISON:  Chest CT 10/16/2020.  No interval chest imaging. FINDINGS: CTA CHEST FINDINGS Cardiovascular: No aortic dissection. No aortic hematoma on noncontrast exam. Normal caliber thoracic aorta. Common origin of the brachiocephalic and left common carotid artery there is no central pulmonary embolus to the segmental level. Heart is normal in size. No pericardial effusion. There are coronary artery calcifications. Mediastinum/Nodes: No mediastinal or hilar adenopathy. No thyroid nodule. Decompressed esophagus. Lungs/Pleura: No acute airspace disease, pulmonary edema, or pleural effusion. Trachea and central bronchi are patent. Previous 2-3 mm pulmonary nodules are less well-defined on the current exam due to motion. Stable 3 mm right middle lobe nodule, series 7, image 48. Musculoskeletal: There are no acute or  suspicious osseous abnormalities. Review of the MIP images confirms the above findings. CTA ABDOMEN AND PELVIS FINDINGS VASCULAR Aorta: Normal caliber aorta without aneurysm, dissection, vasculitis or significant stenosis. Minimal aortic atherosclerosis. Celiac: Patent without evidence of aneurysm, dissection, vasculitis or significant stenosis. SMA: Patent without evidence of aneurysm, dissection, vasculitis or significant stenosis. Replaced left hepatic artery arises from the SMA. Renals: 2 codominant right renal arteries. Single left renal artery. All renal arteries are patent. IMA: Patent without evidence of aneurysm, dissection, vasculitis or significant stenosis. Inflow: Patent without evidence of aneurysm, dissection, vasculitis or significant stenosis. Veins: No obvious venous abnormality within the limitations of this arterial phase study. No portal venous or mesenteric gas. Review of the MIP images confirms the above findings. NON-VASCULAR Hepatobiliary: Hepatomegaly and hepatic steatosis, the liver spans 23.8 cm cranial caudal. No focal hepatic lesion. Calcified gallstone within physiologically distended gallbladder. No pericholecystic inflammation. No biliary dilatation. Pancreas: No ductal dilatation or inflammation. Spleen: Normal in size and arterial enhancement. Small splenule anteriorly. Adrenals/Urinary Tract: Normal adrenal glands. No hydronephrosis or perinephric edema. There are small low-density lesions and within both kidneys, too small to accurately characterize but likely small cysts. Unremarkable urinary bladder. Stomach/Bowel: Decompressed stomach. There is no small bowel obstruction or inflammation. Normal appendix. Submucosal fatty infiltration of the cecum, ascending, and transverse colon without acute inflammatory change. Scattered left colonic diverticulosis without diverticulitis. No acute colonic inflammation. Lymphatic: No bulky abdominopelvic adenopathy. Reproductive: Prostate is  unremarkable. Other: No ascites or free air. Small fat containing inguinal hernias. Tiny fat containing umbilical hernia. 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 There are no acute or suspicious osseous abnormalities. Review of the MIP images confirms the above findings. IMPRESSION: 1. No aortic dissection or acute aortic findings. Minimal aortic atherosclerosis. No pulmonary embolus. 2. No acute abnormality in the chest, abdomen, or pelvis. 3. Hepatomegaly and hepatic steatosis. 4. Cholelithiasis without gallbladder inflammation. 5. Colonic diverticulosis without diverticulitis. 6. 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 Aortic Atherosclerosis (ICD10-I70.0). Electronically Signed   By: Keith Rake M.D.   On: 06/11/2021 17:57    EKG: Independently reviewed.  Assessment/Plan Principal Problem:   Chest pain, rule out acute myocardial infarction Active Problems:   Essential hypertension   HLD (hyperlipidemia)   Pulmonary hypertension (HCC)    CP r/o - (CAD vs HTN urgency) CP obs  pathway Serial trops Tele monitor Cards consult: Plan for [facility] in AM NPO after MN Cont NTG GTT HTN - (exacerbated by CP vs HTN urgency) Cont home BP meds Cont NTG GTT PRN hydralazine if needed HLD - Cont statin Mild PAH - Chronic, seen on 2d echo in Feb.  DVT prophylaxis: Lovenox (unless cards recs full heparin gtt) Code Status: Full Family Communication: Family at bedside Disposition Plan: Home after cards clearance Consults called: Cards Admission status: Place in obs   Arron Mcnaught, [place] [facility]  How to contact the [facility] Attending or Consulting provider [place] or covering provider during after hours [place], for this patient?  Check the care team in [facility] and look for a) attending/consulting [facility] provider listed and b) the [facility] team listed Log into [email or web address]  Amion Physician Scheduling and messaging for groups and whole hospitals  On call and  physician scheduling software for group practices, residents, hospitalists and other medical providers for call, clinic, rotation and shift schedules. On[employer] is a hospital-wide system for scheduling doctors and paging doctors on call. EasyPlot is for scientific plotting and data analysis.  [email or web address]  and use [place]'s universal password to access. If you do not have the password, please contact the hospital operator.  Locate the [facility] provider you are looking for under [facility] and page to a number that you can be directly reached. If you still have difficulty reaching the provider, please page the [facility] (Director on Call) for the Hospitalists listed on amion for assistance.  06/11/2021, 8:06 PM

## 2021-06-11 NOTE — ED Notes (Signed)
Attempted to call report x 1  

## 2021-06-12 ENCOUNTER — Encounter (HOSPITAL_COMMUNITY)
Admission: EM | Disposition: A | Discharge status: Home / Self Care | Payer: Self-pay | Source: Home / Self Care | Attending: Internal Medicine

## 2021-06-12 ENCOUNTER — Observation Stay (HOSPITAL_COMMUNITY): Payer: BC Managed Care – PPO

## 2021-06-12 ENCOUNTER — Encounter (HOSPITAL_COMMUNITY): Payer: Self-pay | Admitting: Internal Medicine

## 2021-06-12 DIAGNOSIS — I25110 Atherosclerotic heart disease of native coronary artery with unstable angina pectoris: Secondary | ICD-10-CM | POA: Diagnosis not present

## 2021-06-12 DIAGNOSIS — R079 Chest pain, unspecified: Secondary | ICD-10-CM | POA: Diagnosis not present

## 2021-06-12 DIAGNOSIS — I10 Essential (primary) hypertension: Secondary | ICD-10-CM | POA: Diagnosis not present

## 2021-06-12 DIAGNOSIS — I200 Unstable angina: Secondary | ICD-10-CM

## 2021-06-12 HISTORY — PX: LEFT HEART CATH AND CORONARY ANGIOGRAPHY: CATH118249

## 2021-06-12 LAB — LIPID PANEL
Cholesterol: 180 mg/dL (ref 0–200)
HDL: 34 mg/dL — ABNORMAL LOW (ref >40)
LDL Cholesterol: 111 mg/dL — ABNORMAL HIGH (ref 0–99)
Total CHOL/HDL Ratio: 5.3 RATIO
Triglycerides: 176 mg/dL — ABNORMAL HIGH (ref <150)
VLDL: 35 mg/dL (ref 0–40)

## 2021-06-12 LAB — SEDIMENTATION RATE: Sed Rate: 5 mm/hr (ref 0–16)

## 2021-06-12 LAB — CK: Total CK: 71 U/L (ref 49–397)

## 2021-06-12 LAB — HIV ANTIBODY (ROUTINE TESTING W REFLEX): HIV Screen 4th Generation wRfx: NONREACTIVE

## 2021-06-12 SURGERY — LEFT HEART CATH AND CORONARY ANGIOGRAPHY
Anesthesia: LOCAL

## 2021-06-12 MED ORDER — SODIUM CHLORIDE 0.9 % IV SOLN
250.0000 mL | INTRAVENOUS | Status: DC | PRN
  Administered 2021-06-14: 250 mL via INTRAVENOUS

## 2021-06-12 MED ORDER — LABETALOL HCL 5 MG/ML IV SOLN: 10.0000 mg | INTRAVENOUS | Status: AC | PRN

## 2021-06-12 MED ORDER — HEPARIN (PORCINE) IN NACL 1000-0.9 UT/500ML-% IV SOLN
INTRAVENOUS | Status: AC
  Filled 2021-06-12: qty 1000

## 2021-06-12 MED ORDER — HEPARIN SODIUM (PORCINE) 1000 UNIT/ML IJ SOLN
INTRAMUSCULAR | Status: DC | PRN
  Administered 2021-06-12: 5000 [IU] via INTRAVENOUS

## 2021-06-12 MED ORDER — VERAPAMIL HCL 2.5 MG/ML IV SOLN
INTRAVENOUS | Status: DC | PRN
  Administered 2021-06-12: 10 mL via INTRA_ARTERIAL

## 2021-06-12 MED ORDER — VERAPAMIL HCL 2.5 MG/ML IV SOLN
INTRAVENOUS | Status: AC
  Filled 2021-06-12: qty 2

## 2021-06-12 MED ORDER — ENOXAPARIN SODIUM 40 MG/0.4ML IJ SOSY
40.0000 mg | PREFILLED_SYRINGE | INTRAMUSCULAR | Status: DC
  Administered 2021-06-13 – 2021-06-15 (×3): 40 mg via SUBCUTANEOUS
  Filled 2021-06-12 (×3): qty 0.4

## 2021-06-12 MED ORDER — ONDANSETRON HCL 4 MG/2ML IJ SOLN
4.0000 mg | Freq: Four times a day (QID) | INTRAMUSCULAR | Status: DC | PRN
  Administered 2021-06-12: 4 mg via INTRAVENOUS
  Filled 2021-06-12 (×2): qty 2

## 2021-06-12 MED ORDER — MIDAZOLAM HCL 2 MG/2ML IJ SOLN
INTRAMUSCULAR | Status: DC | PRN
  Administered 2021-06-12: 1 mg via INTRAVENOUS

## 2021-06-12 MED ORDER — MIDAZOLAM HCL 2 MG/2ML IJ SOLN
INTRAMUSCULAR | Status: AC
  Filled 2021-06-12: qty 2

## 2021-06-12 MED ORDER — LIDOCAINE HCL (PF) 1 % IJ SOLN
INTRAMUSCULAR | Status: AC
  Filled 2021-06-12: qty 30

## 2021-06-12 MED ORDER — HYDRALAZINE HCL 20 MG/ML IJ SOLN: 10.0000 mg | INTRAMUSCULAR | Status: AC | PRN

## 2021-06-12 MED ORDER — LIDOCAINE HCL (PF) 1 % IJ SOLN
INTRAMUSCULAR | Status: DC | PRN
  Administered 2021-06-12: 2 mL

## 2021-06-12 MED ORDER — SODIUM CHLORIDE 0.9% FLUSH
3.0000 mL | Freq: Two times a day (BID) | INTRAVENOUS | Status: DC
  Administered 2021-06-13 (×2): 3 mL via INTRAVENOUS

## 2021-06-12 MED ORDER — ATORVASTATIN CALCIUM 40 MG PO TABS
40.0000 mg | ORAL_TABLET | Freq: Every day | ORAL | Status: DC
  Administered 2021-06-12 – 2021-06-14 (×3): 40 mg via ORAL
  Filled 2021-06-12 (×3): qty 1

## 2021-06-12 MED ORDER — SODIUM CHLORIDE 0.9 % IV SOLN: INTRAVENOUS | Status: AC

## 2021-06-12 MED ORDER — METOPROLOL SUCCINATE ER 50 MG PO TB24
75.0000 mg | ORAL_TABLET | Freq: Every day | ORAL | Status: DC
  Administered 2021-06-12 – 2021-06-14 (×3): 75 mg via ORAL
  Filled 2021-06-12 (×3): qty 1

## 2021-06-12 MED ORDER — HEPARIN SODIUM (PORCINE) 1000 UNIT/ML IJ SOLN
INTRAMUSCULAR | Status: AC
  Filled 2021-06-12: qty 1

## 2021-06-12 MED ORDER — HEPARIN (PORCINE) IN NACL 1000-0.9 UT/500ML-% IV SOLN
INTRAVENOUS | Status: DC | PRN
  Administered 2021-06-12 (×2): 500 mL

## 2021-06-12 MED ORDER — SODIUM CHLORIDE 0.9 % WEIGHT BASED INFUSION
3.0000 mL/kg/h | INTRAVENOUS | Status: DC
  Administered 2021-06-12: 3 mL/kg/h via INTRAVENOUS

## 2021-06-12 MED ORDER — FENTANYL CITRATE (PF) 100 MCG/2ML IJ SOLN
INTRAMUSCULAR | Status: AC
  Filled 2021-06-12: qty 2

## 2021-06-12 MED ORDER — MORPHINE SULFATE (PF) 2 MG/ML IV SOLN
1.0000 mg | INTRAVENOUS | Status: DC | PRN
  Administered 2021-06-13: 1 mg via INTRAVENOUS
  Filled 2021-06-12: qty 1

## 2021-06-12 MED ORDER — ASPIRIN 81 MG PO CHEW
81.0000 mg | CHEWABLE_TABLET | ORAL | Status: AC
  Administered 2021-06-12: 81 mg via ORAL
  Filled 2021-06-12: qty 1

## 2021-06-12 MED ORDER — SODIUM CHLORIDE 0.9% FLUSH: 3.0000 mL | INTRAVENOUS | Status: DC | PRN

## 2021-06-12 MED ORDER — FENTANYL CITRATE (PF) 100 MCG/2ML IJ SOLN
INTRAMUSCULAR | Status: DC | PRN
  Administered 2021-06-12: 50 ug via INTRAVENOUS

## 2021-06-12 MED ORDER — SODIUM CHLORIDE 0.9 % WEIGHT BASED INFUSION: 1.0000 mL/kg/h | INTRAVENOUS | Status: DC

## 2021-06-12 MED ORDER — ASPIRIN EC 81 MG PO TBEC
81.0000 mg | DELAYED_RELEASE_TABLET | Freq: Every day | ORAL | Status: DC
  Administered 2021-06-13 – 2021-06-15 (×3): 81 mg via ORAL
  Filled 2021-06-12 (×3): qty 1

## 2021-06-12 MED ORDER — IOHEXOL 350 MG/ML SOLN
INTRAVENOUS | Status: DC | PRN
  Administered 2021-06-12: 60 mL

## 2021-06-12 SURGICAL SUPPLY — 9 items
CATH 5FR JL3.5 JR4 ANG PIG MP (CATHETERS) ×2 IMPLANT
DEVICE RAD COMP TR BAND LRG (VASCULAR PRODUCTS) ×2 IMPLANT
GLIDESHEATH SLEND SS 6F .021 (SHEATH) ×2 IMPLANT
KIT HEART LEFT (KITS) ×2 IMPLANT
PACK CARDIAC CATHETERIZATION (CUSTOM PROCEDURE TRAY) ×2 IMPLANT
TRANSDUCER W/STOPCOCK (MISCELLANEOUS) ×2 IMPLANT
TUBING CIL FLEX 10 FLL-RA (TUBING) ×2 IMPLANT
WIRE EMERALD 3MM-J .035X150CM (WIRE) ×2 IMPLANT
WIRE HI TORQ VERSACORE J 260CM (WIRE) ×2 IMPLANT

## 2021-06-12 NOTE — Interval H&P Note (Signed)
History and Physical Interval Note:  06/12/2021 12:17 PM  Harold Robinson  has presented today for surgery, with the diagnosis of unstable angina.  The various methods of treatment have been discussed with the patient and family. After consideration of risks, benefits and other options for treatment, the patient has consented to  Procedure(s): LEFT HEART CATH AND CORONARY ANGIOGRAPHY (N/A) as a surgical intervention.  The patient's history has been reviewed, patient examined, no change in status, stable for surgery.  I have reviewed the patient's chart and labs.  Questions were answered to the patient's satisfaction.    Cath Lab Visit (complete for each Cath Lab visit)  Clinical Evaluation Leading to the Procedure:   ACS: Yes.    Non-ACS:  N/A  Kile Kabler

## 2021-06-12 NOTE — Progress Notes (Addendum)
Progress Note  Patient Name: Harold Robinson Date of Encounter: 06/12/2021  [facility] Cardiologist: Freada Bergeron, MD new  Subjective   Chest pain has been continuous since it began.  It has been improved by the nitroglycerin, but is still a 4/10.  He has never had this before. Aunts and uncles on his father side as well as grandparents have had heart disease in their 19s. He has significant dyspnea on exertion, he gets short of breath walking short distances or bending over to tie his shoes this has been going on ever since before he had COVID, but got worse after COVID   Inpatient Medications    Scheduled Meds:  atorvastatin  20 mg Oral QHS   enoxaparin (LOVENOX) injection  40 mg Subcutaneous Q24H   losartan  75 mg Oral QHS   metoprolol succinate  50 mg Oral QHS   montelukast  10 mg Oral QHS   sertraline  100 mg Oral QHS   Continuous Infusions:  nitroGLYCERIN 15 mcg/min (06/11/21 2123)   PRN Meds: acetaminophen, ondansetron (ZOFRAN) IV   Vital Signs    Vitals:   06/12/21 0210 06/12/21 0409 06/12/21 0711 06/12/21 0758  BP: (!) 154/85 (!) 149/93  122/66  Pulse:  76  63  Resp: _0 Temp:  99.2 F (37.3 C)  99.4 F (37.4 C)  TempSrc:  Oral  Oral  SpO2:  95% 97% 93%  Weight:      Height:        Intake/Output Summary (Last 24 hours) at 06/12/2021 0817 Last data filed at 06/12/2021 0600 Gross per 24 hour  Intake 45.36 ml  Output 300 ml  Net -254.64 ml   Last 3 Weights 06/11/2021 06/05/2021 10/23/2020  Weight (lbs) 248 lb 244 lb 242 lb  Weight (kg) 112.492 kg 110.678 kg 109.77 kg      Telemetry    Sinus rhythm- Personally Reviewed  ECG    7/06 ECG sinus rhythm, heart rate 71, no acute ischemic changes, no PR depression, no pathologic Q waves- Personally Reviewed  Physical Exam   GEN: No acute distress.   Neck: No JVD Cardiac: RRR, no murmurs, rubs, or gallops.  Respiratory: Clear to auscultation bilaterally. GI: Soft, nontender,  non-distended  MS: No edema; No deformity. Neuro:  Nonfocal  Psych: Normal affect   Labs    High Sensitivity Troponin:   Recent Labs  Lab 06/11/21 1729 06/11/21 2053  TROPONINIHS 5 4      Chemistry Recent Labs  Lab 06/11/21 1652 06/11/21 1729  NA 139 134*  K 3.8 3.9  CL 104 102  CO2  --  24  GLUCOSE 132* 130*  BUN 12 12  CREATININE 1.00 1.11  CALCIUM  --  8.9  PROT  --  6.7  ALBUMIN  --  3.7  AST  --  23  ALT  --  36  ALKPHOS  --  63  BILITOT  --  1.3*  GFRNONAA  --  >60  ANIONGAP  --  8     Hematology Recent Labs  Lab 06/11/21 1652 06/11/21 1729  WBC  --  14.9*  RBC  --  4.75  HGB 14.6 14.1  HCT 43.0 42.2  MCV  --  88.8  MCH  --  29.7  MCHC  --  33.4  RDW  --  13.2  PLT  --  233   Lab Results  Component Value Date   TSH 1.08 08/21/2019  No results found for: HGBA1C Lab Results  Component Value Date   CHOL 180 06/12/2021   HDL 34 (L) 06/12/2021   LDLCALC 111 (H) 06/12/2021   TRIG 176 (H) 06/12/2021   CHOLHDL 5.3 06/12/2021    BNPNo results for input(s): BNP, PROBNP in the last 168 hours.   DDimer No results for input(s): DDIMER in the last 168 hours.   Radiology    CT Angio Chest/Abd/Pel for Dissection W and/or Wo Contrast  Result Date: 06/11/2021 CLINICAL DATA:  Chest pain radiating into the back EXAM: CT ANGIOGRAPHY CHEST, ABDOMEN AND PELVIS TECHNIQUE: Non-contrast CT of the chest was initially obtained. Multidetector CT imaging through the chest, abdomen and pelvis was performed using the standard protocol during bolus administration of intravenous contrast. Multiplanar reconstructed images and MIPs were obtained and reviewed to evaluate the vascular anatomy. CONTRAST:  142m OMNIPAQUE IOHEXOL 350 MG/ML SOLN COMPARISON:  Chest CT 10/16/2020.  No interval chest imaging. FINDINGS: CTA CHEST FINDINGS Cardiovascular: No aortic dissection. No aortic hematoma on noncontrast exam. Normal caliber thoracic aorta. Common origin of the  brachiocephalic and left common carotid artery there is no central pulmonary embolus to the segmental level. Heart is normal in size. No pericardial effusion. There are coronary artery calcifications. Mediastinum/Nodes: No mediastinal or hilar adenopathy. No thyroid nodule. Decompressed esophagus. Lungs/Pleura: No acute airspace disease, pulmonary edema, or pleural effusion. Trachea and central bronchi are patent. Previous 2-3 mm pulmonary nodules are less well-defined on the current exam due to motion. Stable 3 mm right middle lobe nodule, series 7, image 48. Musculoskeletal: There are no acute or suspicious osseous abnormalities. Review of the MIP images confirms the above findings. CTA ABDOMEN AND PELVIS FINDINGS VASCULAR Aorta: Normal caliber aorta without aneurysm, dissection, vasculitis or significant stenosis. Minimal aortic atherosclerosis. Celiac: Patent without evidence of aneurysm, dissection, vasculitis or significant stenosis. SMA: Patent without evidence of aneurysm, dissection, vasculitis or significant stenosis. Replaced left hepatic artery arises from the SMA. Renals: 2 codominant right renal arteries. Single left renal artery. All renal arteries are patent. IMA: Patent without evidence of aneurysm, dissection, vasculitis or significant stenosis. Inflow: Patent without evidence of aneurysm, dissection, vasculitis or significant stenosis. Veins: No obvious venous abnormality within the limitations of this arterial phase study. No portal venous or mesenteric gas. Review of the MIP images confirms the above findings. NON-VASCULAR Hepatobiliary: Hepatomegaly and hepatic steatosis, the liver spans 23.8 cm cranial caudal. No focal hepatic lesion. Calcified gallstone within physiologically distended gallbladder. No pericholecystic inflammation. No biliary dilatation. Pancreas: No ductal dilatation or inflammation. Spleen: Normal in size and arterial enhancement. Small splenule anteriorly. Adrenals/Urinary  Tract: Normal adrenal glands. No hydronephrosis or perinephric edema. There are small low-density lesions and within both kidneys, too small to accurately characterize but likely small cysts. Unremarkable urinary bladder. Stomach/Bowel: Decompressed stomach. There is no small bowel obstruction or inflammation. Normal appendix. Submucosal fatty infiltration of the cecum, ascending, and transverse colon without acute inflammatory change. Scattered left colonic diverticulosis without diverticulitis. No acute colonic inflammation. Lymphatic: No bulky abdominopelvic adenopathy. Reproductive: Prostate is unremarkable. Other: No ascites or free air. Small fat containing inguinal hernias. Tiny fat containing umbilical hernia. 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 There are no acute or suspicious osseous abnormalities. Review of the MIP images confirms the above findings. IMPRESSION: 1. No aortic dissection or acute aortic findings. Minimal aortic atherosclerosis. No pulmonary embolus. 2. No acute abnormality in the chest, abdomen, or pelvis. 3. Hepatomegaly and hepatic steatosis. 4. Cholelithiasis without gallbladder inflammation.  5. Colonic diverticulosis without diverticulitis. 6. 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 Aortic Atherosclerosis (ICD10-I70.0). Electronically Signed   By: Keith Rake M.D.   On: 06/11/2021 17:57    Cardiac Studies   ECHO: 01/14/2021, ordered by PCP for shortness of breath  1. Left ventricular ejection fraction, by estimation, is 60 to 65%. The left ventricle has normal function. The left ventricle has no regional wall motion abnormalities. Left ventricular diastolic parameters were normal. The average left ventricular global longitudinal strain is -20.1 %. The global longitudinal strain is normal.   2. Right ventricular systolic function is normal. The right ventricular size is normal. There is mildly elevated pulmonary artery systolic  pressure. The estimated right ventricular systolic pressure is 59.5 mmHg.   3. The mitral valve is abnormal. Mild mitral valve regurgitation.   4. The aortic valve is tricuspid. Aortic valve regurgitation is trivial.  Mild aortic valve sclerosis is present, with no evidence of aortic valve stenosis.   5. The inferior vena cava is dilated in size with >50% respiratory  variability, suggesting right atrial pressure of 8 mmHg.   Patient Profile     50 y.o. male w/ hx HTN, HLD, GERD, anxiety, esophagitis, was admitted 07/07 with CP.   Assessment & Plan    Chest pain - ez neg MI despite hours of pain - nitrate responsive, radiates to back but CT neg dissection - coronary calcifications on CT chest - ?pericarditis, no ST elevation or PR depression, but will ck ESR and CK - MD advise on ischemic eval  2.  Dyslipidemia: - With coronary calcifications on CT, MD advise if goal LDL is now 70  3.  Hypertension: - Blood pressure was elevated last p.m., up to 181/88.   - However, it has improved on the nitroglycerin - He is on his home medications of Toprol-XL 50 mg daily and losartan 75 mg daily -Increase Toprol-XL to 75 mg daily    For questions or updates, please contact [place] Please consult [email or web address] for contact info under        Signed, Rosaria Ferries, PA-C  06/12/2021, 8:17 AM    Patient seen and examined and agree with Rosaria Ferries, PA-C as detailed above.  In brief, the patient is a 50 y.o. male with a hx of hypertension, hyperlipidemia and morbid obesity who presented with substernal, exertional chest pain for which Cardiology has been consulted.   Patient has been having intermittent chest tightness and SOB for the past year. Symptoms initially occurred with activity and have progressively worsened. Was planned for out-patient cardiology evaluation but had severe episode on morning of admission prompting him to come to ER.  Trops overnight negative. ECG without  acute ischemic changes. Notably high resolution CT chest with LM and LAD disease. Recent TTE 01/2021 with normal LVEf, normal strain, no WMA, no significant valve disease. Continues to have intermittent chest pain this AM. Given symptoms, risk factors and family history of premature CAD, will plan for cath today  GEN: No acute distress.   Neck: No JVD Cardiac: RRR, no murmurs, rubs, or gallops.  Respiratory: Clear to auscultation bilaterally. GI: Soft, nontender, non-distended  MS: No edema; No deformity. Neuro:  Nonfocal  Psych: Normal affect    Plan: -Plan for coronary angiography given concern for UA; patient is NPO -Start ASA 2m daily -Increase lipitor to 462mdaily; will adjust based on cath results -Continue metop 7595mL daily -Can add ACE/ARB post-cath pending blood pressures -Continue nitro gtt  INFORMED CONSENT: I have reviewed the risks, indications, and alternatives to cardiac catheterization, possible angioplasty, and stenting with the patient. Risks include but are not limited to bleeding, infection, vascular injury, stroke, myocardial infection, arrhythmia, kidney injury, radiation-related injury in the case of prolonged fluoroscopy use, emergency cardiac surgery, and death. The patient understands the risks of serious complication is 1-2 in 0315 with diagnostic cardiac cath and 1-2% or less with angioplasty/stenting.    Gwyndolyn Kaufman, MD

## 2021-06-12 NOTE — H&P (View-Only) (Signed)
Progress Note  Patient Name: Harold Robinson Date of Encounter: 06/12/2021  [facility] Cardiologist: Freada Bergeron, MD new  Subjective   Chest pain has been continuous since it began.  It has been improved by the nitroglycerin, but is still a 4/10.  He has never had this before. Aunts and uncles on his father side as well as grandparents have had heart disease in their 19s. He has significant dyspnea on exertion, he gets short of breath walking short distances or bending over to tie his shoes this has been going on ever since before he had COVID, but got worse after COVID   Inpatient Medications    Scheduled Meds:  atorvastatin  20 mg Oral QHS   enoxaparin (LOVENOX) injection  40 mg Subcutaneous Q24H   losartan  75 mg Oral QHS   metoprolol succinate  50 mg Oral QHS   montelukast  10 mg Oral QHS   sertraline  100 mg Oral QHS   Continuous Infusions:  nitroGLYCERIN 15 mcg/min (06/11/21 2123)   PRN Meds: acetaminophen, ondansetron (ZOFRAN) IV   Vital Signs    Vitals:   06/12/21 0210 06/12/21 0409 06/12/21 0711 06/12/21 0758  BP: (!) 154/85 (!) 149/93  122/66  Pulse:  76  63  Resp: _0 Temp:  99.2 F (37.3 C)  99.4 F (37.4 C)  TempSrc:  Oral  Oral  SpO2:  95% 97% 93%  Weight:      Height:        Intake/Output Summary (Last 24 hours) at 06/12/2021 0817 Last data filed at 06/12/2021 0600 Gross per 24 hour  Intake 45.36 ml  Output 300 ml  Net -254.64 ml   Last 3 Weights 06/11/2021 06/05/2021 10/23/2020  Weight (lbs) 248 lb 244 lb 242 lb  Weight (kg) 112.492 kg 110.678 kg 109.77 kg      Telemetry    Sinus rhythm- Personally Reviewed  ECG    7/06 ECG sinus rhythm, heart rate 71, no acute ischemic changes, no PR depression, no pathologic Q waves- Personally Reviewed  Physical Exam   GEN: No acute distress.   Neck: No JVD Cardiac: RRR, no murmurs, rubs, or gallops.  Respiratory: Clear to auscultation bilaterally. GI: Soft, nontender,  non-distended  MS: No edema; No deformity. Neuro:  Nonfocal  Psych: Normal affect   Labs    High Sensitivity Troponin:   Recent Labs  Lab 06/11/21 1729 06/11/21 2053  TROPONINIHS 5 4      Chemistry Recent Labs  Lab 06/11/21 1652 06/11/21 1729  NA 139 134*  K 3.8 3.9  CL 104 102  CO2  --  24  GLUCOSE 132* 130*  BUN 12 12  CREATININE 1.00 1.11  CALCIUM  --  8.9  PROT  --  6.7  ALBUMIN  --  3.7  AST  --  23  ALT  --  36  ALKPHOS  --  63  BILITOT  --  1.3*  GFRNONAA  --  >60  ANIONGAP  --  8     Hematology Recent Labs  Lab 06/11/21 1652 06/11/21 1729  WBC  --  14.9*  RBC  --  4.75  HGB 14.6 14.1  HCT 43.0 42.2  MCV  --  88.8  MCH  --  29.7  MCHC  --  33.4  RDW  --  13.2  PLT  --  233   Lab Results  Component Value Date   TSH 1.08 08/21/2019  No results found for: HGBA1C Lab Results  Component Value Date   CHOL 180 06/12/2021   HDL 34 (L) 06/12/2021   LDLCALC 111 (H) 06/12/2021   TRIG 176 (H) 06/12/2021   CHOLHDL 5.3 06/12/2021    BNPNo results for input(s): BNP, PROBNP in the last 168 hours.   DDimer No results for input(s): DDIMER in the last 168 hours.   Radiology    CT Angio Chest/Abd/Pel for Dissection W and/or Wo Contrast  Result Date: 06/11/2021 CLINICAL DATA:  Chest pain radiating into the back EXAM: CT ANGIOGRAPHY CHEST, ABDOMEN AND PELVIS TECHNIQUE: Non-contrast CT of the chest was initially obtained. Multidetector CT imaging through the chest, abdomen and pelvis was performed using the standard protocol during bolus administration of intravenous contrast. Multiplanar reconstructed images and MIPs were obtained and reviewed to evaluate the vascular anatomy. CONTRAST:  142m OMNIPAQUE IOHEXOL 350 MG/ML SOLN COMPARISON:  Chest CT 10/16/2020.  No interval chest imaging. FINDINGS: CTA CHEST FINDINGS Cardiovascular: No aortic dissection. No aortic hematoma on noncontrast exam. Normal caliber thoracic aorta. Common origin of the  brachiocephalic and left common carotid artery there is no central pulmonary embolus to the segmental level. Heart is normal in size. No pericardial effusion. There are coronary artery calcifications. Mediastinum/Nodes: No mediastinal or hilar adenopathy. No thyroid nodule. Decompressed esophagus. Lungs/Pleura: No acute airspace disease, pulmonary edema, or pleural effusion. Trachea and central bronchi are patent. Previous 2-3 mm pulmonary nodules are less well-defined on the current exam due to motion. Stable 3 mm right middle lobe nodule, series 7, image 48. Musculoskeletal: There are no acute or suspicious osseous abnormalities. Review of the MIP images confirms the above findings. CTA ABDOMEN AND PELVIS FINDINGS VASCULAR Aorta: Normal caliber aorta without aneurysm, dissection, vasculitis or significant stenosis. Minimal aortic atherosclerosis. Celiac: Patent without evidence of aneurysm, dissection, vasculitis or significant stenosis. SMA: Patent without evidence of aneurysm, dissection, vasculitis or significant stenosis. Replaced left hepatic artery arises from the SMA. Renals: 2 codominant right renal arteries. Single left renal artery. All renal arteries are patent. IMA: Patent without evidence of aneurysm, dissection, vasculitis or significant stenosis. Inflow: Patent without evidence of aneurysm, dissection, vasculitis or significant stenosis. Veins: No obvious venous abnormality within the limitations of this arterial phase study. No portal venous or mesenteric gas. Review of the MIP images confirms the above findings. NON-VASCULAR Hepatobiliary: Hepatomegaly and hepatic steatosis, the liver spans 23.8 cm cranial caudal. No focal hepatic lesion. Calcified gallstone within physiologically distended gallbladder. No pericholecystic inflammation. No biliary dilatation. Pancreas: No ductal dilatation or inflammation. Spleen: Normal in size and arterial enhancement. Small splenule anteriorly. Adrenals/Urinary  Tract: Normal adrenal glands. No hydronephrosis or perinephric edema. There are small low-density lesions and within both kidneys, too small to accurately characterize but likely small cysts. Unremarkable urinary bladder. Stomach/Bowel: Decompressed stomach. There is no small bowel obstruction or inflammation. Normal appendix. Submucosal fatty infiltration of the cecum, ascending, and transverse colon without acute inflammatory change. Scattered left colonic diverticulosis without diverticulitis. No acute colonic inflammation. Lymphatic: No bulky abdominopelvic adenopathy. Reproductive: Prostate is unremarkable. Other: No ascites or free air. Small fat containing inguinal hernias. Tiny fat containing umbilical hernia. 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 There are no acute or suspicious osseous abnormalities. Review of the MIP images confirms the above findings. IMPRESSION: 1. No aortic dissection or acute aortic findings. Minimal aortic atherosclerosis. No pulmonary embolus. 2. No acute abnormality in the chest, abdomen, or pelvis. 3. Hepatomegaly and hepatic steatosis. 4. Cholelithiasis without gallbladder inflammation.  5. Colonic diverticulosis without diverticulitis. 6. 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 Aortic Atherosclerosis (ICD10-I70.0). Electronically Signed   By: Keith Rake M.D.   On: 06/11/2021 17:57    Cardiac Studies   ECHO: 01/14/2021, ordered by PCP for shortness of breath  1. Left ventricular ejection fraction, by estimation, is 60 to 65%. The left ventricle has normal function. The left ventricle has no regional wall motion abnormalities. Left ventricular diastolic parameters were normal. The average left ventricular global longitudinal strain is -20.1 %. The global longitudinal strain is normal.   2. Right ventricular systolic function is normal. The right ventricular size is normal. There is mildly elevated pulmonary artery systolic  pressure. The estimated right ventricular systolic pressure is 59.5 mmHg.   3. The mitral valve is abnormal. Mild mitral valve regurgitation.   4. The aortic valve is tricuspid. Aortic valve regurgitation is trivial.  Mild aortic valve sclerosis is present, with no evidence of aortic valve stenosis.   5. The inferior vena cava is dilated in size with >50% respiratory  variability, suggesting right atrial pressure of 8 mmHg.   Patient Profile     50 y.o. male w/ hx HTN, HLD, GERD, anxiety, esophagitis, was admitted 07/07 with CP.   Assessment & Plan    Chest pain - ez neg MI despite hours of pain - nitrate responsive, radiates to back but CT neg dissection - coronary calcifications on CT chest - ?pericarditis, no ST elevation or PR depression, but will ck ESR and CK - MD advise on ischemic eval  2.  Dyslipidemia: - With coronary calcifications on CT, MD advise if goal LDL is now 70  3.  Hypertension: - Blood pressure was elevated last p.m., up to 181/88.   - However, it has improved on the nitroglycerin - He is on his home medications of Toprol-XL 50 mg daily and losartan 75 mg daily -Increase Toprol-XL to 75 mg daily    For questions or updates, please contact [place] Please consult [email or web address] for contact info under        Signed, Rosaria Ferries, PA-C  06/12/2021, 8:17 AM    Patient seen and examined and agree with Rosaria Ferries, PA-C as detailed above.  In brief, the patient is a 50 y.o. male with a hx of hypertension, hyperlipidemia and morbid obesity who presented with substernal, exertional chest pain for which Cardiology has been consulted.   Patient has been having intermittent chest tightness and SOB for the past year. Symptoms initially occurred with activity and have progressively worsened. Was planned for out-patient cardiology evaluation but had severe episode on morning of admission prompting him to come to ER.  Trops overnight negative. ECG without  acute ischemic changes. Notably high resolution CT chest with LM and LAD disease. Recent TTE 01/2021 with normal LVEf, normal strain, no WMA, no significant valve disease. Continues to have intermittent chest pain this AM. Given symptoms, risk factors and family history of premature CAD, will plan for cath today  GEN: No acute distress.   Neck: No JVD Cardiac: RRR, no murmurs, rubs, or gallops.  Respiratory: Clear to auscultation bilaterally. GI: Soft, nontender, non-distended  MS: No edema; No deformity. Neuro:  Nonfocal  Psych: Normal affect    Plan: -Plan for coronary angiography given concern for UA; patient is NPO -Start ASA 2m daily -Increase lipitor to 462mdaily; will adjust based on cath results -Continue metop 7595mL daily -Can add ACE/ARB post-cath pending blood pressures -Continue nitro gtt  INFORMED CONSENT: I have reviewed the risks, indications, and alternatives to cardiac catheterization, possible angioplasty, and stenting with the patient. Risks include but are not limited to bleeding, infection, vascular injury, stroke, myocardial infection, arrhythmia, kidney injury, radiation-related injury in the case of prolonged fluoroscopy use, emergency cardiac surgery, and death. The patient understands the risks of serious complication is 1-2 in 0315 with diagnostic cardiac cath and 1-2% or less with angioplasty/stenting.    Gwyndolyn Kaufman, MD

## 2021-06-12 NOTE — Progress Notes (Signed)
Patient reports pain substernal, rated 9/10 at rest. Increases with palpation, movement, "It gets up there". No real change wit deep breathing. Stated he has shortness of breath all the time even when lying down. Increases when lying down dependent on what position he is in. Stated he noticed a small difference with Nitro infusion after started.

## 2021-06-12 NOTE — Progress Notes (Addendum)
PROGRESS NOTE    Harold Robinson  FYB:[id number] DOB: [birth year] DOA: 06/11/2021 PCP: Merryl Hacker, No    Brief Narrative:  Harold Robinson is a 50 year old male with past medical history significant for essential hypertension, hyperlipidemia, depression, history of COVID-19 viral infection 1 year ago who presented to [facility] ED on 7/6 with complaint of chest pain.  Patient reports symptoms of chest discomfort, worse with activity and has been present over the last year with recent progression.  In the ED, temperature 97.9 F, HR 69, RR 15, BP 190/90, SPO2 99% on room air.  Sodium 134, potassium 3.9, chloride 102, CO2 24, glucose 130, BUN 12, creatinine 1.11.  WBC 14.9, hemoglobin 14.1, platelets 233.  COVID-19 PCR negative.  Influenza A/B PCR negative.  Urinalysis unrevealing.  High sensitive troponin 5 > 4.  EKG with normal sinus rhythm and no dynamic changes.  CT angio chest/abdomen/pelvis with no aortic dissection or acute aortic findings, minimal aortic atherosclerosis, no pulmonary embolism, no acute abnormality in the chest/abdomen/pelvis, hepatomegaly with hepatic steatosis, cholelithiasis without gallbladder inflammation.  Cardiology was consulted.  Patient was started on nitroglycerin drip.  TRH consulted for further evaluation and management of chest pain concerning for unstable angina and hypertensive urgency.   Assessment & Plan:   Principal Problem:   Chest pain, rule out acute myocardial infarction Active Problems:   Essential hypertension   HLD (hyperlipidemia)   Pulmonary hypertension (HCC)   Unstable angina (HCC)   Chest pain Patient presenting to the ED with progressive chest pain over the last year.  Patient is afebrile.  High-sensitivity troponin negative x2.  CTA chest/abdomen/pelvis with no acute abnormality appreciated.  EKG without concerning dynamic findings.  Left heart catheterization with mild/moderate nonobstructive coronary artery disease with 20-30% mid and distal LAD  stenosis and 40% ostial left circumflex lesion with normal LV contraction. --Cardiology following, appreciate assistance --Nitroglycerin drip --Metoprolol succinate 75 mg p.o. daily --Aspirin 81 mg p.o. daily --Atorvastatin 40 mg p.o. daily  Hypertensive urgency In the ED, patient's BP up to 190/90.  Home antihypertensive regimen includes losartan 75 mg p.o. daily, metoprolol succinate 50 mg p.o. daily. --Metoprolol succinate increased to 75 mg p.o. daily --Losartan 75 mg p.o. daily --On nitroglycerin drip as above --Continue monitor BP closely and adjust as needed  Abdominal pain concerning for biliary colic Patient complaining intermittent abdominal pain localized to the right upper quadrant and epigastric region.  Patient with elevated WBC count 14.9 on admission.  Total bilirubin 1.3. CT angiogram abdomen/pelvis with calcified gallstone with distended gallbladder with no gallbladder inflammation, pericholecystic fluid or biliary dilation. --Right upper quadrant ultrasound  Hyperlipidemia --Atorvastatin increased to 40 mg p.o. daily  Depression: Sertraline 100 mg p.o. daily   DVT prophylaxis: enoxaparin (LOVENOX) injection 40 mg Start: 06/11/21 2000   Code Status: Full Code Family Communication: Updated family present at bedside this morning.  Disposition Plan:  Level of care: Progressive Status is: Observation  The patient remains OBS appropriate and will d/c before 2 midnights.  Dispo: The patient is from: Home              Anticipated d/c is to: Home              Patient currently is not medically stable to d/c.   Difficult to place patient No  Consultants:  Cardiology  Procedures:  Left heart catheterization: Pending  Antimicrobials:  None   Subjective: Patient seen examined bedside, resting comfortably.  Sleeping but easily arousable.  Family present in  room.  Patient continues with mild chest discomfort, much improved while on nitroglycerin drip.  Seen by  cardiology this morning with plan for left heart catheterization today.  No other complaints or concerns at this time.  Currently denies headache, no dizziness, no shortness of breath, no abdominal pain, no weakness, no fatigue, no paresthesias.  No acute events overnight per nursing staff.  Objective: Vitals:   06/12/21 1229 06/12/21 1234 06/12/21 1239 06/12/21 1244  BP: (!) 145/82 134/73 140/74 140/82  Pulse: 70 70 68 70  Resp: 20 (!) 21 (!) 31 (!) 24  Temp:      TempSrc:      SpO2: 96% 96% 95% 98%  Weight:      Height:        Intake/Output Summary (Last 24 hours) at 06/12/2021 1250 Last data filed at 06/12/2021 0600 Gross per 24 hour  Intake 45.36 ml  Output 300 ml  Net -254.64 ml   Filed Weights   06/11/21 1635  Weight: 112.5 kg    Examination:  General exam: Appears calm and comfortable  Respiratory system: Clear to auscultation. Respiratory effort normal. Cardiovascular system: S1 & S2 heard, RRR. No JVD, murmurs, rubs, gallops or clicks. No pedal edema. Gastrointestinal system: Abdomen is nondistended, soft and nontender. No organomegaly or masses felt. Normal bowel sounds heard. Central nervous system: Alert and oriented. No focal neurological deficits. Extremities: Symmetric 5 x 5 power. Skin: No rashes, lesions or ulcers Psychiatry: Judgement and insight appear normal. Mood & affect appropriate.     Data Reviewed: I have personally reviewed following labs and imaging studies  CBC: Recent Labs  Lab 06/11/21 1652 06/11/21 1729  WBC  --  14.9*  NEUTROABS  --  13.2*  HGB 14.6 14.1  HCT 43.0 42.2  MCV  --  88.8  PLT  --  629   Basic Metabolic Panel: Recent Labs  Lab 06/11/21 1652 06/11/21 1729  NA 139 134*  K 3.8 3.9  CL 104 102  CO2  --  24  GLUCOSE 132* 130*  BUN 12 12  CREATININE 1.00 1.11  CALCIUM  --  8.9   GFR: Estimated Creatinine Clearance: 103.8 mL/min (by C-G formula based on SCr of 1.11 mg/dL). Liver Function Tests: Recent Labs  Lab  06/11/21 1729  AST 23  ALT 36  ALKPHOS 63  BILITOT 1.3*  PROT 6.7  ALBUMIN 3.7   No results for input(s): LIPASE, AMYLASE in the last 168 hours. No results for input(s): AMMONIA in the last 168 hours. Coagulation Profile: No results for input(s): INR, PROTIME in the last 168 hours. Cardiac Enzymes: Recent Labs  Lab 06/12/21 0714  CKTOTAL 71   BNP (last 3 results) No results for input(s): PROBNP in the last 8760 hours. HbA1C: No results for input(s): HGBA1C in the last 72 hours. CBG: No results for input(s): GLUCAP in the last 168 hours. Lipid Profile: Recent Labs    06/12/21 0714  CHOL 180  HDL 34*  LDLCALC 111*  TRIG 176*  CHOLHDL 5.3   Thyroid Function Tests: No results for input(s): TSH, T4TOTAL, FREET4, T3FREE, THYROIDAB in the last 72 hours. Anemia Panel: No results for input(s): VITAMINB12, FOLATE, FERRITIN, TIBC, IRON, RETICCTPCT in the last 72 hours. Sepsis Labs: No results for input(s): PROCALCITON, LATICACIDVEN in the last 168 hours.  Recent Results (from the past 240 hour(s))  Resp Panel by RT-PCR (Flu A&B, Covid) Nasopharyngeal Swab     Status: None   Collection Time: 06/11/21  4:54  PM   Specimen: Nasopharyngeal Swab; Nasopharyngeal(NP) swabs in vial transport medium  Result Value Ref Range Status   SARS Coronavirus 2 by RT PCR NEGATIVE NEGATIVE Final    Comment: (NOTE) SARS-CoV-2 target nucleic acids are NOT DETECTED.  The SARS-CoV-2 RNA is generally detectable in upper respiratory specimens during the acute phase of infection. The lowest concentration of SARS-CoV-2 viral copies this assay can detect is 138 copies/mL. A negative result does not preclude SARS-Cov-2 infection and should not be used as the sole basis for treatment or other patient management decisions. A negative result may occur with  improper specimen collection/handling, submission of specimen other than nasopharyngeal swab, presence of viral mutation(s) within the areas  targeted by this assay, and inadequate number of viral copies(<138 copies/mL). A negative result must be combined with clinical observations, patient history, and epidemiological information. The expected result is Negative.  Fact Sheet for Patients:  EntrepreneurPulse.com.au  Fact Sheet for Healthcare Providers:  IncredibleEmployment.be  This test is no t yet approved or cleared by the [place] FDA and  has been authorized for detection and/or diagnosis of SARS-CoV-2 by FDA under an Emergency Use Authorization (EUA). This EUA will remain  in effect (meaning this test can be used) for the duration of the COVID-19 declaration under Section 564(b)(1) of the Act, 21 U.S.C.section 360bbb-3(b)(1), unless the authorization is terminated  or revoked sooner.       Influenza A by PCR NEGATIVE NEGATIVE Final   Influenza B by PCR NEGATIVE NEGATIVE Final    Comment: (NOTE) The Xpert Xpress SARS-CoV-2/FLU/RSV plus assay is intended as an aid in the diagnosis of influenza from Nasopharyngeal swab specimens and should not be used as a sole basis for treatment. Nasal washings and aspirates are unacceptable for Xpert Xpress SARS-CoV-2/FLU/RSV testing.  Fact Sheet for Patients: EntrepreneurPulse.com.au  Fact Sheet for Healthcare Providers: IncredibleEmployment.be  This test is not yet approved or cleared by the [place] FDA and has been authorized for detection and/or diagnosis of SARS-CoV-2 by FDA under an Emergency Use Authorization (EUA). This EUA will remain in effect (meaning this test can be used) for the duration of the COVID-19 declaration under Section 564(b)(1) of the Act, 21 U.S.C. section 360bbb-3(b)(1), unless the authorization is terminated or revoked.  Performed at [facility], [place] [address] [place], [place] [address]          Radiology Studies: CT Angio Chest/Abd/Pel for  Dissection W and/or Wo Contrast  Result Date: 06/11/2021 CLINICAL DATA:  Chest pain radiating into the back EXAM: CT ANGIOGRAPHY CHEST, ABDOMEN AND PELVIS TECHNIQUE: Non-contrast CT of the chest was initially obtained. Multidetector CT imaging through the chest, abdomen and pelvis was performed using the standard protocol during bolus administration of intravenous contrast. Multiplanar reconstructed images and MIPs were obtained and reviewed to evaluate the vascular anatomy. CONTRAST:  176mL OMNIPAQUE IOHEXOL 350 MG/ML SOLN COMPARISON:  Chest CT 10/16/2020.  No interval chest imaging. FINDINGS: CTA CHEST FINDINGS Cardiovascular: No aortic dissection. No aortic hematoma on noncontrast exam. Normal caliber thoracic aorta. Common origin of the brachiocephalic and left common carotid artery there is no central pulmonary embolus to the segmental level. Heart is normal in size. No pericardial effusion. There are coronary artery calcifications. Mediastinum/Nodes: No mediastinal or hilar adenopathy. No thyroid nodule. Decompressed esophagus. Lungs/Pleura: No acute airspace disease, pulmonary edema, or pleural effusion. Trachea and central bronchi are patent. Previous 2-3 mm pulmonary nodules are less well-defined on the current exam due to motion. Stable 3  mm right middle lobe nodule, series 7, image 48. Musculoskeletal: There are no acute or suspicious osseous abnormalities. Review of the MIP images confirms the above findings. CTA ABDOMEN AND PELVIS FINDINGS VASCULAR Aorta: Normal caliber aorta without aneurysm, dissection, vasculitis or significant stenosis. Minimal aortic atherosclerosis. Celiac: Patent without evidence of aneurysm, dissection, vasculitis or significant stenosis. SMA: Patent without evidence of aneurysm, dissection, vasculitis or significant stenosis. Replaced left hepatic artery arises from the SMA. Renals: 2 codominant right renal arteries. Single left renal artery. All renal arteries are patent.  IMA: Patent without evidence of aneurysm, dissection, vasculitis or significant stenosis. Inflow: Patent without evidence of aneurysm, dissection, vasculitis or significant stenosis. Veins: No obvious venous abnormality within the limitations of this arterial phase study. No portal venous or mesenteric gas. Review of the MIP images confirms the above findings. NON-VASCULAR Hepatobiliary: Hepatomegaly and hepatic steatosis, the liver spans 23.8 cm cranial caudal. No focal hepatic lesion. Calcified gallstone within physiologically distended gallbladder. No pericholecystic inflammation. No biliary dilatation. Pancreas: No ductal dilatation or inflammation. Spleen: Normal in size and arterial enhancement. Small splenule anteriorly. Adrenals/Urinary Tract: Normal adrenal glands. No hydronephrosis or perinephric edema. There are small low-density lesions and within both kidneys, too small to accurately characterize but likely small cysts. Unremarkable urinary bladder. Stomach/Bowel: Decompressed stomach. There is no small bowel obstruction or inflammation. Normal appendix. Submucosal fatty infiltration of the cecum, ascending, and transverse colon without acute inflammatory change. Scattered left colonic diverticulosis without diverticulitis. No acute colonic inflammation. Lymphatic: No bulky abdominopelvic adenopathy. Reproductive: Prostate is unremarkable. Other: No ascites or free air. Small fat containing inguinal hernias. Tiny fat containing umbilical hernia. 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 There are no acute or suspicious osseous abnormalities. Review of the MIP images confirms the above findings. IMPRESSION: 1. No aortic dissection or acute aortic findings. Minimal aortic atherosclerosis. No pulmonary embolus. 2. No acute abnormality in the chest, abdomen, or pelvis. 3. Hepatomegaly and hepatic steatosis. 4. Cholelithiasis without gallbladder inflammation. 5. Colonic  diverticulosis without diverticulitis. 6. 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 Aortic Atherosclerosis (ICD10-I70.0). Electronically Signed   By: Keith Rake M.D.   On: 06/11/2021 17:57        Scheduled Meds:  [MAR Hold] aspirin EC  81 mg Oral Daily   [MAR Hold] atorvastatin  40 mg Oral QHS   [MAR Hold] enoxaparin (LOVENOX) injection  40 mg Subcutaneous Q24H   [MAR Hold] losartan  75 mg Oral QHS   [MAR Hold] metoprolol succinate  75 mg Oral QHS   [MAR Hold] montelukast  10 mg Oral QHS   [MAR Hold] sertraline  100 mg Oral QHS   Continuous Infusions:  sodium chloride     nitroGLYCERIN 15 mcg/min (06/11/21 2123)     LOS: 0 days    Time spent: 41 minutes spent on chart review, discussion with nursing staff, consultants, updating family and interview/physical exam; more than 50% of that time was spent in counseling and/or coordination of care.    Jamiee Milholland J [place] DO [facility] Available via Epic secure chat 7am-7pm After these hours, please refer to coverage provider listed on [email or web address] 06/12/2021, 12:50 PM

## 2021-06-13 ENCOUNTER — Encounter (HOSPITAL_COMMUNITY)
Admission: EM | Disposition: A | Discharge status: Home / Self Care | Payer: Self-pay | Source: Home / Self Care | Attending: Internal Medicine

## 2021-06-13 ENCOUNTER — Encounter (HOSPITAL_COMMUNITY): Payer: Self-pay | Admitting: Internal Medicine

## 2021-06-13 ENCOUNTER — Inpatient Hospital Stay (HOSPITAL_COMMUNITY): Payer: BC Managed Care – PPO | Admitting: Anesthesiology

## 2021-06-13 ENCOUNTER — Inpatient Hospital Stay (HOSPITAL_COMMUNITY): Payer: BC Managed Care – PPO

## 2021-06-13 DIAGNOSIS — K8001 Calculus of gallbladder with acute cholecystitis with obstruction: Secondary | ICD-10-CM | POA: Diagnosis present

## 2021-06-13 DIAGNOSIS — I272 Pulmonary hypertension, unspecified: Secondary | ICD-10-CM | POA: Diagnosis present

## 2021-06-13 DIAGNOSIS — R109 Unspecified abdominal pain: Secondary | ICD-10-CM | POA: Diagnosis present

## 2021-06-13 DIAGNOSIS — I249 Acute ischemic heart disease, unspecified: Secondary | ICD-10-CM | POA: Diagnosis present

## 2021-06-13 DIAGNOSIS — Z6834 Body mass index (BMI) 34.0-34.9, adult: Secondary | ICD-10-CM | POA: Diagnosis not present

## 2021-06-13 DIAGNOSIS — Z803 Family history of malignant neoplasm of breast: Secondary | ICD-10-CM | POA: Diagnosis not present

## 2021-06-13 DIAGNOSIS — Z79899 Other long term (current) drug therapy: Secondary | ICD-10-CM | POA: Diagnosis not present

## 2021-06-13 DIAGNOSIS — E782 Mixed hyperlipidemia: Secondary | ICD-10-CM | POA: Diagnosis not present

## 2021-06-13 DIAGNOSIS — Z8249 Family history of ischemic heart disease and other diseases of the circulatory system: Secondary | ICD-10-CM | POA: Diagnosis not present

## 2021-06-13 DIAGNOSIS — R079 Chest pain, unspecified: Secondary | ICD-10-CM | POA: Diagnosis not present

## 2021-06-13 DIAGNOSIS — I160 Hypertensive urgency: Secondary | ICD-10-CM | POA: Diagnosis present

## 2021-06-13 DIAGNOSIS — Z823 Family history of stroke: Secondary | ICD-10-CM | POA: Diagnosis not present

## 2021-06-13 DIAGNOSIS — F32A Depression, unspecified: Secondary | ICD-10-CM | POA: Diagnosis present

## 2021-06-13 DIAGNOSIS — I340 Nonrheumatic mitral (valve) insufficiency: Secondary | ICD-10-CM | POA: Diagnosis present

## 2021-06-13 DIAGNOSIS — Z825 Family history of asthma and other chronic lower respiratory diseases: Secondary | ICD-10-CM | POA: Diagnosis not present

## 2021-06-13 DIAGNOSIS — I2510 Atherosclerotic heart disease of native coronary artery without angina pectoris: Secondary | ICD-10-CM | POA: Diagnosis present

## 2021-06-13 DIAGNOSIS — E785 Hyperlipidemia, unspecified: Secondary | ICD-10-CM | POA: Diagnosis present

## 2021-06-13 DIAGNOSIS — I10 Essential (primary) hypertension: Secondary | ICD-10-CM | POA: Diagnosis present

## 2021-06-13 DIAGNOSIS — R0789 Other chest pain: Secondary | ICD-10-CM | POA: Diagnosis present

## 2021-06-13 DIAGNOSIS — Z8616 Personal history of COVID-19: Secondary | ICD-10-CM | POA: Diagnosis not present

## 2021-06-13 HISTORY — PX: CHOLECYSTECTOMY: SHX55

## 2021-06-13 LAB — COMPREHENSIVE METABOLIC PANEL
ALT: 27 U/L (ref 0–44)
AST: 14 U/L — ABNORMAL LOW (ref 15–41)
Albumin: 3.5 g/dL (ref 3.5–5.0)
Alkaline Phosphatase: 64 U/L (ref 38–126)
Anion gap: 9 (ref 5–15)
BUN: 15 mg/dL (ref 6–20)
CO2: 25 mmol/L (ref 22–32)
Calcium: 8.9 mg/dL (ref 8.9–10.3)
Chloride: 97 mmol/L — ABNORMAL LOW (ref 98–111)
Creatinine, Ser: 1.18 mg/dL (ref 0.61–1.24)
GFR, Estimated: >60 mL/min (ref >60)
Glucose, Bld: 162 mg/dL — ABNORMAL HIGH (ref 70–99)
Potassium: 3.6 mmol/L (ref 3.5–5.1)
Sodium: 131 mmol/L — ABNORMAL LOW (ref 135–145)
Total Bilirubin: 3.1 mg/dL — ABNORMAL HIGH (ref 0.3–1.2)
Total Protein: 6.7 g/dL (ref 6.5–8.1)

## 2021-06-13 LAB — CBC
HCT: 42.1 % (ref 39.0–52.0)
Hemoglobin: 14 g/dL (ref 13.0–17.0)
MCH: 29.4 pg (ref 26.0–34.0)
MCHC: 33.3 g/dL (ref 30.0–36.0)
MCV: 88.3 fL (ref 80.0–100.0)
Platelets: 216 10*3/uL (ref 150–400)
RBC: 4.77 MIL/uL (ref 4.22–5.81)
RDW: 13.7 % (ref 11.5–15.5)
WBC: 19.8 10*3/uL — ABNORMAL HIGH (ref 4.0–10.5)
nRBC: 0 % (ref 0.0–0.2)

## 2021-06-13 LAB — SURGICAL PCR SCREEN
MRSA, PCR: NEGATIVE
Staphylococcus aureus: NEGATIVE

## 2021-06-13 LAB — MAGNESIUM: Magnesium: 1.7 mg/dL (ref 1.7–2.4)

## 2021-06-13 SURGERY — LAPAROSCOPIC CHOLECYSTECTOMY
Anesthesia: General

## 2021-06-13 MED ORDER — OXYCODONE HCL 5 MG PO TABS: 5.0000 mg | ORAL_TABLET | Freq: Once | ORAL | Status: DC | PRN

## 2021-06-13 MED ORDER — HYDROMORPHONE HCL 1 MG/ML IJ SOLN
0.5000 mg | INTRAMUSCULAR | Status: DC | PRN
Start: 2021-06-13 — End: 2021-06-15

## 2021-06-13 MED ORDER — METHOCARBAMOL 1000 MG/10ML IJ SOLN
500.0000 mg | Freq: Four times a day (QID) | INTRAVENOUS | Status: DC | PRN
  Filled 2021-06-13: qty 5

## 2021-06-13 MED ORDER — EPHEDRINE 5 MG/ML INJ
INTRAVENOUS | Status: AC
  Filled 2021-06-13: qty 20

## 2021-06-13 MED ORDER — SODIUM CHLORIDE 0.9 % IV SOLN: INTRAVENOUS | Status: DC

## 2021-06-13 MED ORDER — PROPOFOL 10 MG/ML IV BOLUS
INTRAVENOUS | Status: AC
  Filled 2021-06-13: qty 20

## 2021-06-13 MED ORDER — ROCURONIUM BROMIDE 10 MG/ML (PF) SYRINGE
PREFILLED_SYRINGE | INTRAVENOUS | Status: DC | PRN
  Administered 2021-06-13: 20 mg via INTRAVENOUS
  Administered 2021-06-13: 40 mg via INTRAVENOUS

## 2021-06-13 MED ORDER — MIDAZOLAM HCL 2 MG/2ML IJ SOLN
INTRAMUSCULAR | Status: DC | PRN
  Administered 2021-06-13 (×2): 1 mg via INTRAVENOUS

## 2021-06-13 MED ORDER — FENTANYL CITRATE (PF) 100 MCG/2ML IJ SOLN
INTRAMUSCULAR | Status: DC | PRN
  Administered 2021-06-13: 100 ug via INTRAVENOUS
  Administered 2021-06-13: 50 ug via INTRAVENOUS

## 2021-06-13 MED ORDER — BUPIVACAINE-EPINEPHRINE (PF) 0.25% -1:200000 IJ SOLN
INTRAMUSCULAR | Status: AC
  Filled 2021-06-13: qty 30

## 2021-06-13 MED ORDER — LACTATED RINGERS IV SOLN: INTRAVENOUS | Status: DC

## 2021-06-13 MED ORDER — CHLORHEXIDINE GLUCONATE 0.12 % MT SOLN
OROMUCOSAL | Status: AC
  Administered 2021-06-13: 15 mL via OROMUCOSAL
  Filled 2021-06-13: qty 15

## 2021-06-13 MED ORDER — SUCCINYLCHOLINE CHLORIDE 20 MG/ML IJ SOLN
INTRAMUSCULAR | Status: DC | PRN
  Administered 2021-06-13: 120 mg via INTRAVENOUS

## 2021-06-13 MED ORDER — PROPOFOL 10 MG/ML IV BOLUS
INTRAVENOUS | Status: DC | PRN
  Administered 2021-06-13: 200 mg via INTRAVENOUS

## 2021-06-13 MED ORDER — SUCCINYLCHOLINE CHLORIDE 200 MG/10ML IV SOSY
PREFILLED_SYRINGE | INTRAVENOUS | Status: AC
  Filled 2021-06-13: qty 10

## 2021-06-13 MED ORDER — ROCURONIUM BROMIDE 10 MG/ML (PF) SYRINGE
PREFILLED_SYRINGE | INTRAVENOUS | Status: AC
  Filled 2021-06-13: qty 10

## 2021-06-13 MED ORDER — GADOBUTROL 1 MMOL/ML IV SOLN
9.9000 mL | Freq: Once | INTRAVENOUS | Status: AC | PRN
  Administered 2021-06-13: 9.9 mL via INTRAVENOUS

## 2021-06-13 MED ORDER — SUGAMMADEX SODIUM 200 MG/2ML IV SOLN
INTRAVENOUS | Status: DC | PRN
  Administered 2021-06-13: 200 mg via INTRAVENOUS

## 2021-06-13 MED ORDER — HYDROMORPHONE HCL 1 MG/ML IJ SOLN
INTRAMUSCULAR | Status: AC
  Filled 2021-06-13: qty 2

## 2021-06-13 MED ORDER — BUPIVACAINE-EPINEPHRINE 0.25% -1:200000 IJ SOLN
INTRAMUSCULAR | Status: DC | PRN
  Administered 2021-06-13: 14 mL

## 2021-06-13 MED ORDER — 0.9 % SODIUM CHLORIDE (POUR BTL) OPTIME
TOPICAL | Status: DC | PRN
  Administered 2021-06-13: 1000 mL

## 2021-06-13 MED ORDER — DEXAMETHASONE SODIUM PHOSPHATE 10 MG/ML IJ SOLN
INTRAMUSCULAR | Status: AC
  Filled 2021-06-13: qty 1

## 2021-06-13 MED ORDER — OXYCODONE HCL 5 MG/5ML PO SOLN: 5.0000 mg | Freq: Once | ORAL | Status: DC | PRN

## 2021-06-13 MED ORDER — OXYCODONE HCL 5 MG PO TABS
5.0000 mg | ORAL_TABLET | ORAL | Status: DC | PRN
Start: 2021-06-13 — End: 2021-06-15
  Administered 2021-06-14 – 2021-06-15 (×3): 5 mg via ORAL
  Filled 2021-06-13 (×4): qty 1

## 2021-06-13 MED ORDER — CHLORHEXIDINE GLUCONATE 0.12 % MT SOLN: 15.0000 mL | Freq: Once | OROMUCOSAL | Status: AC

## 2021-06-13 MED ORDER — LIDOCAINE HCL (PF) 2 % IJ SOLN
INTRAMUSCULAR | Status: DC | PRN
  Administered 2021-06-13: 60 mg via INTRADERMAL

## 2021-06-13 MED ORDER — PHENYLEPHRINE 40 MCG/ML (10ML) SYRINGE FOR IV PUSH (FOR BLOOD PRESSURE SUPPORT)
PREFILLED_SYRINGE | INTRAVENOUS | Status: AC
  Filled 2021-06-13: qty 10

## 2021-06-13 MED ORDER — GABAPENTIN 300 MG PO CAPS
300.0000 mg | ORAL_CAPSULE | Freq: Three times a day (TID) | ORAL | Status: DC
  Administered 2021-06-13 – 2021-06-15 (×6): 300 mg via ORAL
  Filled 2021-06-13 (×6): qty 1

## 2021-06-13 MED ORDER — LIDOCAINE 2% (20 MG/ML) 5 ML SYRINGE
INTRAMUSCULAR | Status: AC
  Filled 2021-06-13: qty 5

## 2021-06-13 MED ORDER — FENTANYL CITRATE (PF) 250 MCG/5ML IJ SOLN
INTRAMUSCULAR | Status: AC
  Filled 2021-06-13: qty 5

## 2021-06-13 MED ORDER — ORAL CARE MOUTH RINSE: 15.0000 mL | Freq: Once | OROMUCOSAL | Status: AC

## 2021-06-13 MED ORDER — MIDAZOLAM HCL 2 MG/2ML IJ SOLN
INTRAMUSCULAR | Status: AC
  Filled 2021-06-13: qty 2

## 2021-06-13 MED ORDER — MAGNESIUM SULFATE 2 GM/50ML IV SOLN
2.0000 g | Freq: Once | INTRAVENOUS | Status: AC
  Administered 2021-06-13: 2 g via INTRAVENOUS
  Filled 2021-06-13: qty 50

## 2021-06-13 MED ORDER — PHENYLEPHRINE 40 MCG/ML (10ML) SYRINGE FOR IV PUSH (FOR BLOOD PRESSURE SUPPORT)
PREFILLED_SYRINGE | INTRAVENOUS | Status: DC | PRN
  Administered 2021-06-13 (×2): 120 ug via INTRAVENOUS
  Administered 2021-06-13: 160 ug via INTRAVENOUS

## 2021-06-13 MED ORDER — PIPERACILLIN-TAZOBACTAM 3.375 G IVPB
3.3750 g | Freq: Three times a day (TID) | INTRAVENOUS | Status: DC
  Administered 2021-06-13 – 2021-06-15 (×6): 3.375 g via INTRAVENOUS
  Filled 2021-06-13 (×8): qty 50

## 2021-06-13 MED ORDER — HYDROMORPHONE HCL 1 MG/ML IJ SOLN: 0.2500 mg | INTRAMUSCULAR | Status: DC | PRN

## 2021-06-13 MED ORDER — ONDANSETRON HCL 4 MG/2ML IJ SOLN
INTRAMUSCULAR | Status: AC
  Filled 2021-06-13: qty 2

## 2021-06-13 MED ORDER — ONDANSETRON HCL 4 MG/2ML IJ SOLN: 4.0000 mg | Freq: Once | INTRAMUSCULAR | Status: DC | PRN

## 2021-06-13 MED ORDER — SODIUM CHLORIDE 0.9 % IR SOLN
Status: DC | PRN
  Administered 2021-06-13 (×2): 1000 mL

## 2021-06-13 MED ORDER — EPHEDRINE SULFATE-NACL 50-0.9 MG/10ML-% IV SOSY
PREFILLED_SYRINGE | INTRAVENOUS | Status: DC | PRN
  Administered 2021-06-13: 15 mg via INTRAVENOUS
  Administered 2021-06-13: 5 mg via INTRAVENOUS

## 2021-06-13 MED ORDER — DROPERIDOL 2.5 MG/ML IJ SOLN: 0.6250 mg | Freq: Once | INTRAMUSCULAR | Status: DC | PRN

## 2021-06-13 MED ORDER — ONDANSETRON HCL 4 MG/2ML IJ SOLN
INTRAMUSCULAR | Status: DC | PRN
  Administered 2021-06-13: 4 mg via INTRAVENOUS

## 2021-06-13 MED ORDER — DEXAMETHASONE SODIUM PHOSPHATE 10 MG/ML IJ SOLN
INTRAMUSCULAR | Status: DC | PRN
  Administered 2021-06-13: 5 mg via INTRAVENOUS

## 2021-06-13 SURGICAL SUPPLY — 35 items
APPLIER CLIP 5 13 M/L LIGAMAX5 (MISCELLANEOUS) ×2
BAG COUNTER SPONGE SURGICOUNT (BAG) ×2 IMPLANT
CANISTER SUCT 3000ML PPV (MISCELLANEOUS) ×2 IMPLANT
CLIP APPLIE 5 13 M/L LIGAMAX5 (MISCELLANEOUS) ×1 IMPLANT
CNTNR URN SCR LID CUP LEK RST (MISCELLANEOUS) ×1 IMPLANT
CONT SPEC 4OZ STRL OR WHT (MISCELLANEOUS) ×2
COVER MAYO STAND STRL (DRAPES) ×2 IMPLANT
COVER SURGICAL LIGHT HANDLE (MISCELLANEOUS) ×2 IMPLANT
DERMABOND ADVANCED (GAUZE/BANDAGES/DRESSINGS) ×1
DERMABOND ADVANCED .7 DNX12 (GAUZE/BANDAGES/DRESSINGS) ×1 IMPLANT
DRAPE C-ARM 42X120 X-RAY (DRAPES) ×2 IMPLANT
ELECT REM PT RETURN 9FT ADLT (ELECTROSURGICAL) ×2
ELECTRODE REM PT RTRN 9FT ADLT (ELECTROSURGICAL) ×1 IMPLANT
GLOVE SURG ENC MOIS LTX SZ6 (GLOVE) ×2 IMPLANT
GLOVE SURG UNDER LTX SZ6.5 (GLOVE) ×2 IMPLANT
GOWN STRL REUS W/ TWL LRG LVL3 (GOWN DISPOSABLE) ×3 IMPLANT
GOWN STRL REUS W/TWL LRG LVL3 (GOWN DISPOSABLE) ×6
GRASPER SUT TROCAR 14GX15 (MISCELLANEOUS) ×2 IMPLANT
KIT BASIN OR (CUSTOM PROCEDURE TRAY) ×2 IMPLANT
KIT TURNOVER KIT B (KITS) ×2 IMPLANT
NEEDLE INSUFFLATION 14GA 120MM (NEEDLE) ×2 IMPLANT
NS IRRIG 1000ML POUR BTL (IV SOLUTION) ×2 IMPLANT
PAD ARMBOARD 7.5X6 YLW CONV (MISCELLANEOUS) ×2 IMPLANT
POUCH SPECIMEN RETRIEVAL 10MM (ENDOMECHANICALS) ×2 IMPLANT
SCISSORS LAP 5X35 DISP (ENDOMECHANICALS) ×2 IMPLANT
SET IRRIG TUBING LAPAROSCOPIC (IRRIGATION / IRRIGATOR) ×2 IMPLANT
SET TUBE SMOKE EVAC HIGH FLOW (TUBING) ×2 IMPLANT
SLEEVE ENDOPATH XCEL 5M (ENDOMECHANICALS) ×4 IMPLANT
SUT MNCRL AB 4-0 PS2 18 (SUTURE) ×2 IMPLANT
TOWEL GREEN STERILE (TOWEL DISPOSABLE) ×2 IMPLANT
TOWEL GREEN STERILE FF (TOWEL DISPOSABLE) ×2 IMPLANT
TRAY LAPAROSCOPIC MC (CUSTOM PROCEDURE TRAY) ×2 IMPLANT
TROCAR XCEL NON-BLD 11X100MML (ENDOMECHANICALS) ×2 IMPLANT
TROCAR XCEL NON-BLD 5MMX100MML (ENDOMECHANICALS) ×2 IMPLANT
WATER STERILE IRR 1000ML POUR (IV SOLUTION) ×2 IMPLANT

## 2021-06-13 NOTE — Discharge Instructions (Signed)
[facility] ______[facility] LAPAROSCOPIC SURGERY: POST OP INSTRUCTIONS Always review your discharge instruction sheet given to you by the facility where your surgery was performed. IF YOU HAVE DISABILITY OR FAMILY LEAVE FORMS, YOU MUST BRING THEM TO THE OFFICE FOR PROCESSING.   DO NOT GIVE THEM TO YOUR DOCTOR.  A prescription for pain medication may be given to you upon discharge.  Take your pain medication as prescribed, if needed.  If narcotic pain medicine is not needed, then you may take acetaminophen (Tylenol) or ibuprofen (Advil) as needed. Take your usually prescribed medications unless otherwise directed. If you need a refill on your pain medication, please contact your pharmacy.  They will contact our office to request authorization. Prescriptions will not be filled after 5pm or on week-ends. You should follow a light diet the first few days after arrival home, such as soup and crackers, etc.  Be sure to include lots of fluids daily. Most patients will experience some swelling and bruising in the area of the incisions.  Ice packs will help.  Swelling and bruising can take several days to resolve.  It is common to experience some constipation if taking pain medication after surgery.  Increasing fluid intake and taking a stool softener (such as Colace) will usually help or prevent this problem from occurring.  A mild laxative (Milk of Magnesia or Miralax) should be taken according to package instructions if there are no bowel movements after 48 hours. Unless discharge instructions indicate otherwise, you may remove your bandages 24-48 hours after surgery, and you may shower at that time.  You may have steri-strips (small skin tapes) in place directly over the incision.  These strips should be left on the skin for 7-10 days.  If your surgeon used skin glue on the incision, you may shower in 24 hours.  The glue will flake off over the next 2-3 weeks.  Any sutures or staples will be  removed at the office during your follow-up visit. ACTIVITIES:  You may resume regular (light) daily activities beginning the next day--such as daily self-care, walking, climbing stairs--gradually increasing activities as tolerated.  You may have sexual intercourse when it is comfortable.  Refrain from any heavy lifting or straining until approved by your doctor. You may drive when you are no longer taking prescription pain medication, you can comfortably wear a seatbelt, and you can safely maneuver your car and apply brakes. RETURN TO WORK:  __________________________________________________________ You should see your doctor in the office for a follow-up appointment approximately 2-3 weeks after your surgery.  Make sure that you call for this appointment within a day or two after you arrive home to insure a convenient appointment time. OTHER INSTRUCTIONS: __________________________________________________________________________________________________________________________ __________________________________________________________________________________________________________________________ WHEN TO CALL YOUR DOCTOR: Fever over 101.0 Inability to urinate Continued bleeding from incision. Increased pain, redness, or drainage from the incision. Increasing abdominal pain  The clinic staff is available to answer your questions during regular business hours.  Please don't hesitate to call and ask to speak to one of the nurses for clinical concerns.  If you have a medical emergency, go to the nearest emergency room or call 911.  A surgeon from [facility] is always on call at the hospital. [address], [address], [place], [place]  [address] ? [address] [address], [place],    [address] [phone] ? [phone] ? FAX [phone] Web site: [email or web address]  

## 2021-06-13 NOTE — Anesthesia Procedure Notes (Signed)
Procedure Name: Intubation Date/Time: 06/13/2021 1:34 PM Performed by: Barrington Ellison, CRNA Pre-anesthesia Checklist: Patient identified, Emergency Drugs available, Suction available and Patient being monitored Patient Re-evaluated:Patient Re-evaluated prior to induction Oxygen Delivery Method: Circle System Utilized Preoxygenation: Pre-oxygenation with 100% oxygen Induction Type: IV induction, Rapid sequence and Cricoid Pressure applied Ventilation: Mask ventilation without difficulty Laryngoscope Size: Mac and 4 Grade View: Grade III Tube type: Oral Tube size: 7.5 mm Number of attempts: 2 Airway Equipment and Method: Stylet and Oral airway Placement Confirmation: ETT inserted through vocal cords under direct vision, positive ETCO2 and breath sounds checked- equal and bilateral Secured at: 22 cm Tube secured with: Tape Dental Injury: Teeth and Oropharynx as per pre-operative assessment  Difficulty Due To: Difficulty was anticipated and Difficult Airway- due to large tongue Future Recommendations: Recommend- induction with short-acting agent, and alternative techniques readily available Comments: Attempt 1 by EMT student, unable to view cords, 2nd attempt by M. Foster, MDA. Grade 3 view

## 2021-06-13 NOTE — Progress Notes (Signed)
Pharmacy Antibiotic Note  Harold Robinson is a 50 y.o. male admitted on 06/11/2021 with  intra-abdominal infection .  Pharmacy has been consulted for Zosyn dosing.  Patient presented to the ED with complaints of intermittent abdominal pain localized to RUQ and epigastric pain. WBC count elevated at 14.9 on admission >> 19.8. Total bilirubin 1.3.   CT abdomen/pelvis and ultrasound of abdomen RUQ showed gallstone without acute cholecystitis.  Plan: Zosyn 3.375g IV q8h (4 hour infusion).  Height: 5\' 11"  (180.3 cm) Weight: 112.5 kg (248 lb) IBW/kg (Calculated) : 75.3  Temp (24hrs), Avg:99.1 F (37.3 C), Min:97.8 F (36.6 C), Max:99.9 F (37.7 C)  Recent Labs  Lab 06/11/21 1652 06/11/21 1729 06/13/21 0314  WBC  --  14.9* 19.8*  CREATININE 1.00 1.11 1.18    Estimated Creatinine Clearance: 97.7 mL/min (by C-G formula based on SCr of 1.18 mg/dL).    No Known Allergies  Antimicrobials this admission: Zosyn 7/8 >>   Microbiology results: 7/8 BCx: in process 7/6 UCx: negative   Thank you for allowing pharmacy to be a part of this patient's care.  Marcene Corning 06/13/2021 7:29 AM

## 2021-06-13 NOTE — Progress Notes (Addendum)
Progress Note  Patient Name: Harold Robinson Date of Encounter: 06/13/2021  [facility] Cardiologist: Freada Bergeron, MD   Subjective   Having quite a bit of abdominal pain this morning. States "they gotta get this gallbladder out". No chest pain or SOB. No palpitations  Inpatient Medications    Scheduled Meds:  aspirin EC  81 mg Oral Daily   atorvastatin  40 mg Oral QHS   enoxaparin (LOVENOX) injection  40 mg Subcutaneous Q24H   losartan  75 mg Oral QHS   metoprolol succinate  75 mg Oral QHS   montelukast  10 mg Oral QHS   sertraline  100 mg Oral QHS   sodium chloride flush  3 mL Intravenous Q12H   Continuous Infusions:  sodium chloride     sodium chloride     nitroGLYCERIN Stopped (06/12/21 1500)   piperacillin-tazobactam (ZOSYN)  IV 3.375 g (06/13/21 0840)   PRN Meds: sodium chloride, acetaminophen, morphine injection, ondansetron (ZOFRAN) IV, sodium chloride flush   Vital Signs    Vitals:   06/12/21 1620 06/12/21 1823 06/13/21 0500 06/13/21 0848  BP: (!) 172/84 (!) 144/87 131/62 (!) 141/81  Pulse: 86 89  72  Resp: (!) 28 20 17 18   Temp: 97.8 F (36.6 C) 99.9 F (37.7 C)  97.6 F (36.4 C)  TempSrc: Oral Oral  Oral  SpO2: 93% 98% 99% 92%  Weight:      Height:        Intake/Output Summary (Last 24 hours) at 06/13/2021 1041 Last data filed at 06/12/2021 1700 Gross per 24 hour  Intake 532.94 ml  Output 450 ml  Net 82.94 ml   Last 3 Weights 06/11/2021 06/05/2021 10/23/2020  Weight (lbs) 248 lb 244 lb 242 lb  Weight (kg) 112.492 kg 110.678 kg 109.77 kg      Telemetry    Sinus rhythm - Personally Reviewed  ECG    No new tracings - Personally Reviewed  Physical Exam   GEN: No acute distress.   Neck: No JVD Cardiac: RRR, no murmurs, rubs, or gallops.  Respiratory: Clear to auscultation bilaterally. GI: Soft, +TTP, non-distended  MS: No edema; No deformity. Neuro:  Nonfocal  Psych: Normal affect   Labs    High Sensitivity Troponin:    Recent Labs  Lab 06/11/21 1729 06/11/21 2053  TROPONINIHS 5 4      Chemistry Recent Labs  Lab 06/11/21 1652 06/11/21 1729 06/13/21 0314  NA 139 134* 131*  K 3.8 3.9 3.6  CL 104 102 97*  CO2  --  24 25  GLUCOSE 132* 130* 162*  BUN 12 12 15   CREATININE 1.00 1.11 1.18  CALCIUM  --  8.9 8.9  PROT  --  6.7 6.7  ALBUMIN  --  3.7 3.5  AST  --  23 14*  ALT  --  36 27  ALKPHOS  --  63 64  BILITOT  --  1.3* 3.1*  GFRNONAA  --  >60 >60  ANIONGAP  --  8 9     Hematology Recent Labs  Lab 06/11/21 1652 06/11/21 1729 06/13/21 0314  WBC  --  14.9* 19.8*  RBC  --  4.75 4.77  HGB 14.6 14.1 14.0  HCT 43.0 42.2 42.1  MCV  --  88.8 88.3  MCH  --  29.7 29.4  MCHC  --  33.4 33.3  RDW  --  13.2 13.7  PLT  --  233 216    BNPNo results for input(s):  BNP, PROBNP in the last 168 hours.   DDimer No results for input(s): DDIMER in the last 168 hours.   Radiology    CARDIAC CATHETERIZATION  Result Date: 06/12/2021 Conclusions: 1. Mild to moderate, non-obstructive coronary artery disease with 20-30% mid and distal LAD stenosis and 40% ostial LCx lesion. 2. Normal left ventricular contraction with mildly elevated filling pressure. Recommendations: 1. Continue medical therapy and risk factor modification.  No severe coronary artery disease identified to explain chest pain.  Currently, patient complains mostly of lower abdominal pain.  Consider further workup of noncardiac causes of chest/abdominial pain. 2. Wean off nitroglycerin infusion. Nelva Bush, MD [facility]   CT Angio Chest/Abd/Pel for Dissection W and/or Wo Contrast  Result Date: 06/11/2021 CLINICAL DATA:  Chest pain radiating into the back EXAM: CT ANGIOGRAPHY CHEST, ABDOMEN AND PELVIS TECHNIQUE: Non-contrast CT of the chest was initially obtained. Multidetector CT imaging through the chest, abdomen and pelvis was performed using the standard protocol during bolus administration of intravenous contrast. Multiplanar  reconstructed images and MIPs were obtained and reviewed to evaluate the vascular anatomy. CONTRAST:  150mL OMNIPAQUE IOHEXOL 350 MG/ML SOLN COMPARISON:  Chest CT 10/16/2020.  No interval chest imaging. FINDINGS: CTA CHEST FINDINGS Cardiovascular: No aortic dissection. No aortic hematoma on noncontrast exam. Normal caliber thoracic aorta. Common origin of the brachiocephalic and left common carotid artery there is no central pulmonary embolus to the segmental level. Heart is normal in size. No pericardial effusion. There are coronary artery calcifications. Mediastinum/Nodes: No mediastinal or hilar adenopathy. No thyroid nodule. Decompressed esophagus. Lungs/Pleura: No acute airspace disease, pulmonary edema, or pleural effusion. Trachea and central bronchi are patent. Previous 2-3 mm pulmonary nodules are less well-defined on the current exam due to motion. Stable 3 mm right middle lobe nodule, series 7, image 48. Musculoskeletal: There are no acute or suspicious osseous abnormalities. Review of the MIP images confirms the above findings. CTA ABDOMEN AND PELVIS FINDINGS VASCULAR Aorta: Normal caliber aorta without aneurysm, dissection, vasculitis or significant stenosis. Minimal aortic atherosclerosis. Celiac: Patent without evidence of aneurysm, dissection, vasculitis or significant stenosis. SMA: Patent without evidence of aneurysm, dissection, vasculitis or significant stenosis. Replaced left hepatic artery arises from the SMA. Renals: 2 codominant right renal arteries. Single left renal artery. All renal arteries are patent. IMA: Patent without evidence of aneurysm, dissection, vasculitis or significant stenosis. Inflow: Patent without evidence of aneurysm, dissection, vasculitis or significant stenosis. Veins: No obvious venous abnormality within the limitations of this arterial phase study. No portal venous or mesenteric gas. Review of the MIP images confirms the above findings. NON-VASCULAR Hepatobiliary:  Hepatomegaly and hepatic steatosis, the liver spans 23.8 cm cranial caudal. No focal hepatic lesion. Calcified gallstone within physiologically distended gallbladder. No pericholecystic inflammation. No biliary dilatation. Pancreas: No ductal dilatation or inflammation. Spleen: Normal in size and arterial enhancement. Small splenule anteriorly. Adrenals/Urinary Tract: Normal adrenal glands. No hydronephrosis or perinephric edema. There are small low-density lesions and within both kidneys, too small to accurately characterize but likely small cysts. Unremarkable urinary bladder. Stomach/Bowel: Decompressed stomach. There is no small bowel obstruction or inflammation. Normal appendix. Submucosal fatty infiltration of the cecum, ascending, and transverse colon without acute inflammatory change. Scattered left colonic diverticulosis without diverticulitis. No acute colonic inflammation. Lymphatic: No bulky abdominopelvic adenopathy. Reproductive: Prostate is unremarkable. Other: No ascites or free air. Small fat containing inguinal hernias. Tiny fat containing umbilical hernia. 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 There are no acute or suspicious osseous abnormalities. Review of  the MIP images confirms the above findings. IMPRESSION: 1. No aortic dissection or acute aortic findings. Minimal aortic atherosclerosis. No pulmonary embolus. 2. No acute abnormality in the chest, abdomen, or pelvis. 3. Hepatomegaly and hepatic steatosis. 4. Cholelithiasis without gallbladder inflammation. 5. Colonic diverticulosis without diverticulitis. 6. 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 Aortic Atherosclerosis (ICD10-I70.0). Electronically Signed   By: Keith Rake M.D.   On: 06/11/2021 17:57   US Abdomen Limited RUQ (LIVER/GB)  Result Date: 06/12/2021 CLINICAL DATA:  Abdominal pain EXAM: ULTRASOUND ABDOMEN LIMITED RIGHT UPPER QUADRANT COMPARISON:  None. FINDINGS: Gallbladder:  Physiologically distended. Rounded echogenic structure without shadowing in the gallbladder likely corresponds to stone seen on prior CT. No gallbladder wall thickening. No sonographic Murphy sign noted by sonographer. Common bile duct: Diameter: 4-5 mm. Liver: Heterogeneous and increased parenchymal echogenicity. No discrete focal lesion. Liver parenchyma is difficult to penetrate. Portal vein is patent on color Doppler imaging with normal direction of blood flow towards the liver. Other: No right upper quadrant ascites. IMPRESSION: 1. Gallstone without sonographic findings of acute cholecystitis. 2. Hepatic steatosis. Electronically Signed   By: Keith Rake M.D.   On: 06/12/2021 19:56    Cardiac Studies   Echocardiogram 01/2021: 1. Left ventricular ejection fraction, by estimation, is 60 to 65%. The  left ventricle has normal function. The left ventricle has no regional  wall motion abnormalities. Left ventricular diastolic parameters were  normal. The average left ventricular  global longitudinal strain is -20.1 %. The global longitudinal strain is  normal.   2. Right ventricular systolic function is normal. The right ventricular  size is normal. There is mildly elevated pulmonary artery systolic  pressure. The estimated right ventricular systolic pressure is 29.4 mmHg.   3. The mitral valve is abnormal. Mild mitral valve regurgitation.   4. The aortic valve is tricuspid. Aortic valve regurgitation is trivial.  Mild aortic valve sclerosis is present, with no evidence of aortic valve  stenosis.   5. The inferior vena cava is dilated in size with >50% respiratory  variability, suggesting right atrial pressure of 8 mmHg.   LHC 06/12/21: Conclusions: Mild to moderate, non-obstructive coronary artery disease with 20-30% mid and distal LAD stenosis and 40% ostial LCx lesion. Normal left ventricular contraction with mildly elevated filling pressure.   Recommendations: Continue medical  therapy and risk factor modification.  No severe coronary artery disease identified to explain chest pain.  Currently, patient complains mostly of lower abdominal pain.  Consider further workup of noncardiac causes of chest/abdominial pain. Wean off nitroglycerin infusion.  Diagnostic Dominance: Right      Patient Profile     50 y.o. male with a PMH of HTN, HLD, GERD, esophagitis, and anxiety who is being followed by cardiology for the evaluation of chest pain.  Assessment & Plan    1. Non-obstructive CAD: patient presented with chest pain. HsTrop negative. CTA chest was without dissection but showed coronary artery calcifications. CP was responsive to nitro. Decision made to pursue a LHC for definitive evaluation which showed mild-moderate non-obstructive CAD with 20% pLAD and 30% mLAD stenosis, 40% pLCx stenosis, and minimal RCA disease. He was recommended for aggressive risk factor modifications. Echo earlier this year with EF 60-65%, normal LV diastolic function, and mild MR.  - Continue aspirin and statin - Continue Bblocker - Goal BP <130/80, LDL <70, and A1C <7  2. HTN: BP elevated on admission. Home metoprolol succinate increased to 75mg  daily and he was continued on losartan 75mg  daily.  BP remains persistently above goal though he continues to have quite a bit of abdominal pain which may be driving some of his HTN - Will continue metoprolol succinate and losartan at current doses with low threshold to increase if persistently elevated with adequate pain control  3. HLD: LDL 111 and triglycerides 176 this admission. Goal LDL <70 and atorvastatin increased to 40mg  daily - Continue atorvastatin - Will need repeat FLP/LFTs in 6-8 weeks  4. Mitral regurgitation: mild on echo 01/2021.  - Continue surveillance monitoring   5. Abdominal pain: patient with RUQ/epigastric pain. CTA A/P showed calcified gallstone with distended gallbladder. He had Temp in the 99 range overnight with  rising WBC. Primary team started zosyn this morning.  - Continue management per primary team    For questions or updates, please contact [place] Please consult [email or web address] for contact info under        Signed, Abigail Butts, PA-C  06/13/2021, 10:41 AM    Patient seen and examined and agree with Roby Lofts, PA-C as detailed above.  In brief, the patient is a  50 y.o. male with a hx of hypertension, hyperlipidemia and morbid obesity who presented with substernal, exertional chest pain for which Cardiology was consulted.  Underwent LHC on 06/12/21 with mild-to-moderate nonobstructive disease with 20-30% mid-distal LAD and 40% ostial stenosis. Notably, pain changes in quality to RUQ/epigastric pain with CTA with calcified gallstone with distended gallbladder. Now undergoing work-up per primary for concern for cholecystitis.   GEN: No acute distress.   Neck: No JVD Cardiac: RRR, no murmurs, rubs, or gallops.  Respiratory: Clear to auscultation bilaterally. GI: Soft, TTP in RUQ MS: No edema; No deformity. Right radial cath site c/d/i Neuro:  Nonfocal  Psych: Normal affect    Plan: -Cath without obstructive disease; work-up of abdominal pain per IM -Continue ASA 81mg  daily, atorvastatin 40mg  daily -Continue losartan 75mg  daily -Continue metoprolol 75mg  XL daily -Will arrange for CV follow-up as out-patient; Cardiology will sign-off at this time  Gwyndolyn Kaufman, MD

## 2021-06-13 NOTE — Consult Note (Signed)
Consult Note  Harold Robinson [birth year]  [id number].    Requesting MD: [place] Eric, DO Chief Complaint/Reason for Consult: cholelithiasis  HPI:  50 yo male with medical history significant for  essential hypertension, hyperlipidemia, depression, history of COVID-19 viral infection 1 year ago who presented to [facility] ED on 7/6 with complaint of chest pain. He was admitted to the hospitalist team for further evaluation and management.  Cardiology consulted and he underwent [facility] which showed mild-moderate non-obstructive CAD - on medical therapy with ASA, statin, beta blocker. Echo this year EF 60-65%.  Further workup significant for CT A/P 7/6 showing distended gallbladder with cholelithiasis without gallbladder inflammation. US showing gallstone without sonographic findings of acute cholecystitis. In addition, WBC 19.8 (14.9), T bili 3.1 (1.3). He has been started on zosyn today  He states that for up to the last year he has had post prandial abdominal pain and night time nausea and emesis. This episode of pain began with CP as above but CP has now resolved and pain is progressively worsened in epigastrium and RUQ. Pain radiates into his right flank. Last oral intake was last night and he had abdominal and nausea following. Last BM was this am. He states he has been diagnosed with colitis in the past and has diarrhea/urgency of bowel movements after eating at baseline. He has not been evaluated by gastroenterology.  He has had shortness of breath since his COVID infection.  He denies recent fever, chills. ROS otherwise as below  Wife was bedside and assisted with providing history  Substance use: none Allergies: none Past Surgeries: no prior abdominal surgeries  ROS: Review of Systems  Constitutional:  Negative for chills and fever.  Respiratory:  Positive for shortness of breath. Negative for cough and wheezing.   Cardiovascular:  Positive for chest pain and palpitations. Negative  for leg swelling.  Gastrointestinal:  Positive for abdominal pain, diarrhea, nausea and vomiting. Negative for constipation.  Genitourinary: Negative.    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"   Esophagitis    Hiatal hernia    Hyperlipidemia    Hypertension    Neck pain    Pleurisy     Past Surgical History:  Procedure Laterality Date   LEFT HEART CATH AND CORONARY ANGIOGRAPHY N/A 06/12/2021   Procedure: LEFT HEART CATH AND CORONARY ANGIOGRAPHY;  Surgeon: Nelva Bush, MD;  Location: [facility];  Service: Cardiovascular;  Laterality: N/A;   ORIF TIBIA PLATEAU Left 12/11/2016   Procedure: OPEN REDUCTION INTERNAL FIXATION (ORIF) LEFT TIBIAL PLATEAU FRACTURE;  Surgeon: Mcarthur Rossetti, MD;  Location: [facility];  Service: Orthopedics;  Laterality: Left;   WISDOM TOOTH EXTRACTION      Social History:  reports that he has never smoked. He has never used smokeless tobacco. He reports that he does not drink alcohol and does not  use drugs.  Allergies: No Known Allergies  Medications Prior to Admission  Medication Sig Dispense Refill   atorvastatin (LIPITOR) 20 MG tablet Take 1 tablet (20 mg total) by mouth daily. (Patient taking differently: Take 20 mg by mouth at bedtime.) 90 tablet 3   losartan (COZAAR) 50 MG tablet TAKE 1 & 1/2 TABLETS (75 MG TOTAL) BY MOUTH DAILY. MUST SCHEDULE PHYSICAL (Patient taking differently: Take 75 mg by mouth at bedtime.) 135 tablet 0   metoprolol succinate (TOPROL-XL) 50 MG 24 hr tablet TAKE 1 TABLET (50 MG  TOTAL) BY MOUTH DAILY. (Patient taking differently: Take 50 mg by mouth at bedtime.) 90 tablet 3   montelukast (SINGULAIR) 10 MG tablet TAKE 1 TABLET BY MOUTH EVERYDAY AT BEDTIME (Patient taking differently: Take 10 mg by mouth at bedtime.) 90 tablet 0   sertraline (ZOLOFT) 100 MG tablet TAKE 1 TABLET (100 MG TOTAL) BY MOUTH DAILY. SCHEDULE PHYSICAL EXAM (Patient taking differently: Take 100 mg by mouth at bedtime.) 30 tablet 0    Blood pressure (!) 141/81, pulse 72, temperature 97.6 F (36.4 C), temperature source Oral, resp. rate 18, height 5\' 11"  (1.803 m), weight 112.5 kg, SpO2 92 %. Physical Exam:  General: pleasant, WD, male who is laying in bed in NAD HEENT: head is normocephalic, atraumatic.  Sclera are noninjected.  PERRL.  Ears and nose without any masses or lesions.  Mouth is pink and moist Heart: regular, rate, and rhythm.  Normal s1,s2. No obvious murmurs, gallops, or rubs noted.  Palpable radial and pedal pulses bilaterally Lungs: CTAB, no wheezes, rhonchi, or rales noted.  Respiratory effort nonlabored on supplemental O2 via [place] Abd: soft, +BS, mildly distended. Focal TTP over RUQ with guarding. Mild TTP R flank. No rebound.  MS: all 4 extremities are symmetrical with no cyanosis, clubbing, or edema. Skin: warm and dry with no masses, lesions, or rashes Neuro: Cranial nerves 2-12 grossly intact, sensation is normal throughout Psych: A&Ox3 with an appropriate affect.   Results for orders placed or performed during the hospital encounter of 06/11/21 (from the past 48 hour(s))  I-stat chem 8, ED (not at [facility] or [facility])     Status: Abnormal   Collection Time: 06/11/21  4:52 PM  Result Value Ref Range   Sodium 139 135 - 145 mmol/L   Potassium 3.8 3.5 - 5.1 mmol/L   Chloride 104 98 - 111 mmol/L   BUN 12 6 - 20 mg/dL   Creatinine, Ser 1.00 0.61 - 1.24 mg/dL   Glucose, Bld 132 (H) 70 - 99 mg/dL    Comment: Glucose reference range applies only to samples taken after fasting for at  least 8 hours.   Calcium, [name] 1.11 (L) 1.15 - 1.40 mmol/L   TCO2 23 22 - 32 mmol/L   Hemoglobin 14.6 13.0 - 17.0 g/dL   HCT 43.0 39.0 - 52.0 %  Resp Panel by RT-PCR (Flu A&B, Covid) Nasopharyngeal Swab     Status: None   Collection Time: 06/11/21  4:54 PM   Specimen: Nasopharyngeal Swab; Nasopharyngeal(NP) swabs in vial transport medium  Result Value Ref Range   SARS Coronavirus 2 by RT PCR NEGATIVE NEGATIVE    Comment: (NOTE) SARS-CoV-2 target nucleic acids are NOT DETECTED.  The SARS-CoV-2 RNA is generally detectable in upper respiratory specimens during the acute phase of infection. The lowest concentration of SARS-CoV-2 viral copies this assay can detect is 138 copies/mL. A negative result does not preclude SARS-Cov-2 infection and should not be used as the  sole basis for treatment or other patient management decisions. A negative result may occur with  improper specimen collection/handling, submission of specimen other than nasopharyngeal swab, presence of viral mutation(s) within the areas targeted by this assay, and inadequate number of viral copies(<138 copies/mL). A negative result must be combined with clinical observations, patient history, and epidemiological information. The expected result is Negative.  Fact Sheet for Patients:  EntrepreneurPulse.com.au  Fact Sheet for Healthcare Providers:  IncredibleEmployment.be  This test is no t yet approved or cleared by the [place] FDA and  has been authorized for detection and/or diagnosis of SARS-CoV-2 by FDA under an Emergency Use Authorization (EUA). This EUA will remain  in effect (meaning this test can be used) for the duration of the COVID-19 declaration under Section 564(b)(1) of the Act, 21 U.S.C.section 360bbb-3(b)(1), unless the authorization is terminated  or revoked sooner.       Influenza A by PCR NEGATIVE NEGATIVE   Influenza B by PCR NEGATIVE NEGATIVE    Comment:  (NOTE) The Xpert Xpress SARS-CoV-2/FLU/RSV plus assay is intended as an aid in the diagnosis of influenza from Nasopharyngeal swab specimens and should not be used as a sole basis for treatment. Nasal washings and aspirates are unacceptable for Xpert Xpress SARS-CoV-2/FLU/RSV testing.  Fact Sheet for Patients: EntrepreneurPulse.com.au  Fact Sheet for Healthcare Providers: IncredibleEmployment.be  This test is not yet approved or cleared by the [place] FDA and has been authorized for detection and/or diagnosis of SARS-CoV-2 by FDA under an Emergency Use Authorization (EUA). This EUA will remain in effect (meaning this test can be used) for the duration of the COVID-19 declaration under Section 564(b)(1) of the Act, 21 U.S.C. section 360bbb-3(b)(1), unless the authorization is terminated or revoked.  Performed at [facility], [place] [address] [place], [place] [address]   CBC with Differential     Status: Abnormal   Collection Time: 06/11/21  5:29 PM  Result Value Ref Range   WBC 14.9 (H) 4.0 - 10.5 K/uL   RBC 4.75 4.22 - 5.81 MIL/uL   Hemoglobin 14.1 13.0 - 17.0 g/dL   HCT 42.2 39.0 - 52.0 %   MCV 88.8 80.0 - 100.0 fL   MCH 29.7 26.0 - 34.0 pg   MCHC 33.4 30.0 - 36.0 g/dL   RDW 13.2 11.5 - 15.5 %   Platelets 233 150 - 400 K/uL   nRBC 0.0 0.0 - 0.2 %   Neutrophils Relative % 89 %   Neutro Abs 13.2 (H) 1.7 - 7.7 K/uL   Lymphocytes Relative 5 %   Lymphs Abs 0.8 0.7 - 4.0 K/uL   Monocytes Relative 5 %   Monocytes Absolute 0.8 0.1 - 1.0 K/uL   Eosinophils Relative 0 %   Eosinophils Absolute 0.0 0.0 - 0.5 K/uL   Basophils Relative 0 %   Basophils Absolute 0.0 0.0 - 0.1 K/uL   Immature Granulocytes 1 %   Abs Immature Granulocytes 0.09 (H) 0.00 - 0.07 K/uL    Comment: Performed at [facility] [address], [place], [place] [address]  Comprehensive metabolic panel     Status: Abnormal   Collection Time: 06/11/21  5:29 PM   Result Value Ref Range   Sodium 134 (L) 135 - 145 mmol/L   Potassium 3.9 3.5 - 5.1 mmol/L   Chloride 102 98 - 111 mmol/L   CO2 24 22 - 32 mmol/L   Glucose, Bld 130 (H) 70 - 99 mg/dL    Comment:  Glucose reference range applies only to samples taken after fasting for at least 8 hours.   BUN 12 6 - 20 mg/dL   Creatinine, Ser 1.11 0.61 - 1.24 mg/dL   Calcium 8.9 8.9 - 10.3 mg/dL   Total Protein 6.7 6.5 - 8.1 g/dL   Albumin 3.7 3.5 - 5.0 g/dL   AST 23 15 - 41 U/L   ALT 36 0 - 44 U/L   Alkaline Phosphatase 63 38 - 126 U/L   Total Bilirubin 1.3 (H) 0.3 - 1.2 mg/dL   GFR, Estimated >60 >60 mL/min    Comment: (NOTE) Calculated using the CKD-EPI Creatinine Equation (2021)    Anion gap 8 5 - 15    Comment: Performed at [facility] [address] [place], [place] [address]  Troponin I (High Sensitivity)     Status: None   Collection Time: 06/11/21  5:29 PM  Result Value Ref Range   Troponin I (High Sensitivity) 5 <18 ng/L    Comment: (NOTE) Elevated high sensitivity troponin I (hsTnI) values and significant  changes across serial measurements may suggest ACS but many other  chronic and acute conditions are known to elevate hsTnI results.  Refer to the "Links" section for chest pain algorithms and additional  guidance. Performed at [facility], [place] [address] [place], [place] [address]   Urinalysis, Routine w reflex microscopic Urine, Clean Catch     Status: Abnormal   Collection Time: 06/11/21  6:57 PM  Result Value Ref Range   Color, Urine YELLOW YELLOW   APPearance CLEAR CLEAR   Specific Gravity, Urine >1.046 (H) 1.005 - 1.030   pH 5.0 5.0 - 8.0   Glucose, UA NEGATIVE NEGATIVE mg/dL   Hgb urine dipstick NEGATIVE NEGATIVE   Bilirubin Urine NEGATIVE NEGATIVE   Ketones, ur NEGATIVE NEGATIVE mg/dL   Protein, ur NEGATIVE NEGATIVE mg/dL   Nitrite NEGATIVE NEGATIVE   Leukocytes,Ua NEGATIVE NEGATIVE    Comment: Performed at [facility] [address],  [place], [place] [address]  Troponin I (High Sensitivity)     Status: None   Collection Time: 06/11/21  8:53 PM  Result Value Ref Range   Troponin I (High Sensitivity) 4 <18 ng/L    Comment: (NOTE) Elevated high sensitivity troponin I (hsTnI) values and significant  changes across serial measurements may suggest ACS but many other  chronic and acute conditions are known to elevate hsTnI results.  Refer to the "Links" section for chest pain algorithms and additional  guidance. Performed at [facility], [place] [address] [place], [place] [address]   HIV Antibody (routine testing w rflx)     Status: None   Collection Time: 06/11/21  8:53 PM  Result Value Ref Range   HIV Screen 4th Generation wRfx Non Reactive Non Reactive    Comment: Performed at [facility], [place] [address] [place],  [address]  Lipid panel     Status: Abnormal   Collection Time: 06/12/21  7:14 AM  Result Value Ref Range   Cholesterol 180 0 - 200 mg/dL   Triglycerides 176 (H) <150 mg/dL   HDL 34 (L) >40 mg/dL   Total CHOL/HDL Ratio 5.3 RATIO   VLDL 35 0 - 40 mg/dL   LDL Cholesterol 111 (H) 0 - 99 mg/dL    Comment:        Total Cholesterol/HDL:CHD Risk Coronary Heart Disease Risk Table  Men   Women  1/2 Average Risk   3.4   3.3  Average Risk       5.0   4.4  2 X Average Risk   9.6   7.1  3 X Average Risk  23.4   11.0        Use the calculated Patient Ratio above and the CHD Risk Table to determine the patient's CHD Risk.        ATP III CLASSIFICATION (LDL):  <100     mg/dL   Optimal  100-129  mg/dL   Near or Above                    Optimal  130-159  mg/dL   Borderline  160-189  mg/dL   High  >190     mg/dL   Very High Performed at [facility] [address] [place], [place] [address]   CK     Status: None   Collection Time: 06/12/21  7:14 AM  Result Value Ref Range   Total CK 71 49 - 397 U/L    Comment: Performed at [facility], [place] [address],  [place], [place] [address]  Sedimentation rate     Status: None   Collection Time: 06/12/21  7:14 AM  Result Value Ref Range   Sed Rate 5 0 - 16 mm/hr    Comment: Performed at [facility] [address] [place], [place] [address]  CBC     Status: Abnormal   Collection Time: 06/13/21  3:14 AM  Result Value Ref Range   WBC 19.8 (H) 4.0 - 10.5 K/uL   RBC 4.77 4.22 - 5.81 MIL/uL   Hemoglobin 14.0 13.0 - 17.0 g/dL   HCT 42.1 39.0 - 52.0 %   MCV 88.3 80.0 - 100.0 fL   MCH 29.4 26.0 - 34.0 pg   MCHC 33.3 30.0 - 36.0 g/dL   RDW 13.7 11.5 - 15.5 %   Platelets 216 150 - 400 K/uL   nRBC 0.0 0.0 - 0.2 %    Comment: Performed at [facility], [place] [address], [place], [place] [address]  Magnesium     Status: None   Collection Time: 06/13/21  3:14 AM  Result Value Ref Range   Magnesium 1.7 1.7 - 2.4 mg/dL    Comment: Performed at [facility] [address], [place], [place] [address]  Comprehensive metabolic panel     Status: Abnormal   Collection Time: 06/13/21  3:14 AM  Result Value Ref Range   Sodium 131 (L) 135 - 145 mmol/L   Potassium 3.6 3.5 - 5.1 mmol/L   Chloride 97 (L) 98 - 111 mmol/L   CO2 25 22 - 32 mmol/L   Glucose, Bld 162 (H) 70 - 99 mg/dL    Comment: Glucose reference range applies only to samples taken after fasting for at least 8 hours.   BUN 15 6 - 20 mg/dL   Creatinine, Ser 1.18 0.61 - 1.24 mg/dL   Calcium 8.9 8.9 - 10.3 mg/dL   Total Protein 6.7 6.5 - 8.1 g/dL   Albumin 3.5 3.5 - 5.0 g/dL   AST 14 (L) 15 - 41 U/L   ALT 27 0 - 44 U/L   Alkaline Phosphatase 64 38 - 126 U/L   Total Bilirubin 3.1 (H) 0.3 - 1.2 mg/dL   GFR, Estimated >60 >60 mL/min    Comment: (NOTE) Calculated using  the CKD-EPI Creatinine Equation (2021)    Anion gap 9 5 - 15    Comment: Performed at [facility], [place] [address], [place], [place] [address]   CARDIAC CATHETERIZATION  Result Date: 06/12/2021 Conclusions: 1. Mild to moderate, non-obstructive coronary artery  disease with 20-30% mid and distal LAD stenosis and 40% ostial LCx lesion. 2. Normal left ventricular contraction with mildly elevated filling pressure. Recommendations: 1. Continue medical therapy and risk factor modification.  No severe coronary artery disease identified to explain chest pain.  Currently, patient complains mostly of lower abdominal pain.  Consider further workup of noncardiac causes of chest/abdominial pain. 2. Wean off nitroglycerin infusion. Nelva Bush, MD [facility]   CT Angio Chest/Abd/Pel for Dissection W and/or Wo Contrast  Result Date: 06/11/2021 CLINICAL DATA:  Chest pain radiating into the back EXAM: CT ANGIOGRAPHY CHEST, ABDOMEN AND PELVIS TECHNIQUE: Non-contrast CT of the chest was initially obtained. Multidetector CT imaging through the chest, abdomen and pelvis was performed using the standard protocol during bolus administration of intravenous contrast. Multiplanar reconstructed images and MIPs were obtained and reviewed to evaluate the vascular anatomy. CONTRAST:  156mL OMNIPAQUE IOHEXOL 350 MG/ML SOLN COMPARISON:  Chest CT 10/16/2020.  No interval chest imaging. FINDINGS: CTA CHEST FINDINGS Cardiovascular: No aortic dissection. No aortic hematoma on noncontrast exam. Normal caliber thoracic aorta. Common origin of the brachiocephalic and left common carotid artery there is no central pulmonary embolus to the segmental level. Heart is normal in size. No pericardial effusion. There are coronary artery calcifications. Mediastinum/Nodes: No mediastinal or hilar adenopathy. No thyroid nodule. Decompressed esophagus. Lungs/Pleura: No acute airspace disease, pulmonary edema, or pleural effusion. Trachea and central bronchi are patent. Previous 2-3 mm pulmonary nodules are less well-defined on the current exam due to motion. Stable 3 mm right middle lobe nodule, series 7, image 48. Musculoskeletal: There are no acute or suspicious osseous abnormalities. Review of the MIP images  confirms the above findings. CTA ABDOMEN AND PELVIS FINDINGS VASCULAR Aorta: Normal caliber aorta without aneurysm, dissection, vasculitis or significant stenosis. Minimal aortic atherosclerosis. Celiac: Patent without evidence of aneurysm, dissection, vasculitis or significant stenosis. SMA: Patent without evidence of aneurysm, dissection, vasculitis or significant stenosis. Replaced left hepatic artery arises from the SMA. Renals: 2 codominant right renal arteries. Single left renal artery. All renal arteries are patent. IMA: Patent without evidence of aneurysm, dissection, vasculitis or significant stenosis. Inflow: Patent without evidence of aneurysm, dissection, vasculitis or significant stenosis. Veins: No obvious venous abnormality within the limitations of this arterial phase study. No portal venous or mesenteric gas. Review of the MIP images confirms the above findings. NON-VASCULAR Hepatobiliary: Hepatomegaly and hepatic steatosis, the liver spans 23.8 cm cranial caudal. No focal hepatic lesion. Calcified gallstone within physiologically distended gallbladder. No pericholecystic inflammation. No biliary dilatation. Pancreas: No ductal dilatation or inflammation. Spleen: Normal in size and arterial enhancement. Small splenule anteriorly. Adrenals/Urinary Tract: Normal adrenal glands. No hydronephrosis or perinephric edema. There are small low-density lesions and within both kidneys, too small to accurately characterize but likely small cysts. Unremarkable urinary bladder. Stomach/Bowel: Decompressed stomach. There is no small bowel obstruction or inflammation. Normal appendix. Submucosal fatty infiltration of the cecum, ascending, and transverse colon without acute inflammatory change. Scattered left colonic diverticulosis without diverticulitis. No acute colonic inflammation. Lymphatic: No bulky abdominopelvic adenopathy. Reproductive: Prostate is unremarkable. Other: No ascites or free air. Small fat  containing inguinal hernias. Tiny fat containing umbilical hernia. 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 There are  no acute or suspicious osseous abnormalities. Review of the MIP images confirms the above findings. IMPRESSION: 1. No aortic dissection or acute aortic findings. Minimal aortic atherosclerosis. No pulmonary embolus. 2. No acute abnormality in the chest, abdomen, or pelvis. 3. Hepatomegaly and hepatic steatosis. 4. Cholelithiasis without gallbladder inflammation. 5. Colonic diverticulosis without diverticulitis. 6. 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 Aortic Atherosclerosis (ICD10-I70.0). Electronically Signed   By: Keith Rake M.D.   On: 06/11/2021 17:57   US Abdomen Limited RUQ (LIVER/GB)  Result Date: 06/12/2021 CLINICAL DATA:  Abdominal pain EXAM: ULTRASOUND ABDOMEN LIMITED RIGHT UPPER QUADRANT COMPARISON:  None. FINDINGS: Gallbladder: Physiologically distended. Rounded echogenic structure without shadowing in the gallbladder likely corresponds to stone seen on prior CT. No gallbladder wall thickening. No sonographic Murphy sign noted by sonographer. Common bile duct: Diameter: 4-5 mm. Liver: Heterogeneous and increased parenchymal echogenicity. No discrete focal lesion. Liver parenchyma is difficult to penetrate. Portal vein is patent on color Doppler imaging with normal direction of blood flow towards the liver. Other: No right upper quadrant ascites. IMPRESSION: 1. Gallstone without sonographic findings of acute cholecystitis. 2. Hepatic steatosis. Electronically Signed   By: Keith Rake M.D.   On: 06/12/2021 19:56      Assessment/Plan Cholelithiasis, possible cholecystitis - CT and [place] with distended gallbladder and cholelithiasis. Tender over the right upper quadrant and with post prandial symptoms - afebrile. worsening leukocytosis 19.8 (14.9) - elevated T. Bili 3.1 (1.3) - recommend cholecystectomy today with IOC. We discussed  the risks/benefits of this procedure including pulmonary/cardiac risks related to general anesthesia. All questions answered. - If IOC positive he will need further evaluation by GI.  FEN: NPO, IVF per primary ID: agree with zosyn VTE: lovenox  Per primary: Chest pain - negative LHC. Cariology following Hypertensive urgency Hyperlipidemia Depression  Winferd Humphrey, [facility] 06/13/2021, 11:34 AM Please see Amion for pager number during day hours 7:00am-4:30pm

## 2021-06-13 NOTE — Anesthesia Postprocedure Evaluation (Signed)
Anesthesia Post Note  Patient: Harold Robinson  Procedure(s) Performed: LAPAROSCOPIC CHOLECYSTECTOMY     Patient location during evaluation: PACU Anesthesia Type: General Level of consciousness: awake and alert and oriented Pain management: pain level controlled Vital Signs Assessment: post-procedure vital signs reviewed and stable Respiratory status: spontaneous breathing, nonlabored ventilation and respiratory function stable Cardiovascular status: blood pressure returned to baseline and stable Postop Assessment: no apparent nausea or vomiting Anesthetic complications: no   No notable events documented.  Last Vitals:  Vitals:   06/13/21 1510 06/13/21 1520  BP: 125/75   Pulse: 72 71  Resp: (!) 24 18  Temp:    SpO2: 98% 98%    Last Pain:  Vitals:   06/13/21 1455  TempSrc:   PainSc: Asleep                 Chauntae Hults A.

## 2021-06-13 NOTE — Anesthesia Preprocedure Evaluation (Signed)
Anesthesia Evaluation  Patient identified by MRN, date of birth, ID band Patient awake    Reviewed: Allergy & Precautions, NPO status , Patient's Chart, lab work & pertinent test results, reviewed documented beta blocker date and time   Airway Mallampati: II  TM Distance: >3 FB Neck ROM: Full    Dental no notable dental hx. (+) Teeth Intact, Dental Advisory Given   Pulmonary shortness of breath and with exertion,    Pulmonary exam normal breath sounds clear to auscultation       Cardiovascular hypertension, Pt. on medications + DOE  Normal cardiovascular exam Rhythm:Regular Rate:Normal     Neuro/Psych PSYCHIATRIC DISORDERS Anxiety Depression negative neurological ROS     GI/Hepatic Neg liver ROS, hiatal hernia, Cholelithiasis with acute cholecystitis   Endo/Other  Obesity Hyperlipidemia  Renal/GU   negative genitourinary   Musculoskeletal  (+) Arthritis , Osteoarthritis,    Abdominal (+) + obese,   Peds  Hematology negative hematology ROS (+)   Anesthesia Other Findings   Reproductive/Obstetrics                             Anesthesia Physical Anesthesia Plan  ASA: 2  Anesthesia Plan: General   Post-op Pain Management:    Induction: Intravenous, Cricoid pressure planned and Rapid sequence  PONV Risk Score and Plan: 4 or greater and Treatment may vary due to age or medical condition, Midazolam, Ondansetron and Dexamethasone  Airway Management Planned: Oral ETT  Additional Equipment:   Intra-op Plan:   Post-operative Plan: Extubation in OR  Informed Consent: I have reviewed the patients History and Physical, chart, labs and discussed the procedure including the risks, benefits and alternatives for the proposed anesthesia with the patient or authorized representative who has indicated his/her understanding and acceptance.     Dental advisory given  Plan Discussed with: CRNA  and Anesthesiologist  Anesthesia Plan Comments:         Anesthesia Quick Evaluation

## 2021-06-13 NOTE — Transfer of Care (Signed)
Immediate Anesthesia Transfer of Care Note  Patient: Harold Robinson  Procedure(s) Performed: LAPAROSCOPIC CHOLECYSTECTOMY  Patient Location: PACU  Anesthesia Type:General  Level of Consciousness: lethargic and responds to stimulation  Airway & Oxygen Therapy: Patient Spontanous Breathing and Patient connected to face mask oxygen  Post-op Assessment: Report given to RN  Post vital signs: Reviewed and stable  Last Vitals:  Vitals Value Taken Time  BP 110/71 06/13/21 1454  Temp    Pulse 73 06/13/21 1456  Resp 27 06/13/21 1456  SpO2 97 % 06/13/21 1456  Vitals shown include unvalidated device data.  Last Pain:  Vitals:   06/13/21 1210  TempSrc: Oral  PainSc:       Patients Stated Pain Goal: 1 (32/44/01 0272)  Complications: No notable events documented.

## 2021-06-13 NOTE — Op Note (Signed)
Operative Note  ROBERTS BON 50 y.o. male [id number]  06/13/2021  Surgeon: Clovis Riley MD FACS  Assistant: Lucas Mallow MD ([id number]) I was personally present during the key and critical portions of this procedure and immediately available throughout the entire procedure, as documented in my operative note.   Procedure performed: Laparoscopic Cholecystectomy  Procedure classification: urgent/emergent  Preop diagnosis: cholecystitis Post-op diagnosis/intraop findings: same  Specimens: gallbladder  Retained items: none  EBL: minimal  Complications: none  Description of procedure: After obtaining informed consent the patient was brought to the operating room.  He is receiving standing antibiotics. SCD's were applied. General endotracheal anesthesia was initiated and a formal time-out was performed. The abdomen was prepped and draped in the usual sterile fashion and the abdomen was entered using an infraumbilical veress needle after instilling the site with local. Insufflation to 33mmHg was obtained, 38mm trocar and camera inserted, and gross inspection revealed no evidence of injury from our entry or other intraabdominal abnormalities. Two 28mm trocars were introduced in the right midclavicular and right anterior axillary lines under direct visualization and following infiltration with local. An 29mm trocar was placed in the epigastrium. The gallbladder was retracted cephalad and the infundibulum was retracted laterally.  Omental adhesions to the gallbladder were taken down with cautery and blunt dissection.  A combination of hook electrocautery and blunt dissection was utilized to clear the peritoneum from the neck and cystic duct, circumferentially isolating the cystic artery and cystic duct and lifting the gallbladder from the cystic plate. The critical view of safety was achieved with the cystic artery, cystic duct, and liver bed visualized between them with no other structures.  There is  a stone wedged in the distal aspect of the cystic duct.  Inflammatory adhesions to the cystic duct proximally left a very short landing zone for clip application.  Cholangiogram was not performed due to short cystic duct obstructed by stone.  The artery was clipped with a single clip proximally and distally and divided as was the cystic duct with three clips on the proximal end. The gallbladder was dissected from the liver plate using electrocautery. Once freed the gallbladder was placed in an endocatch bag and removed intact through the epigastric trocar site. A small amount of bleeding on the liver bed was controlled with cautery. Some bile had been spilled from the clipped end of the gallbladder during its dissection from the liver bed. This was aspirated and the right upper quadrant was irrigated copiously until the effluent was clear. Hemostasis was once again confirmed, and reinspection of the abdomen revealed no injuries. The clips were well opposed without any bile leak from the duct or the liver bed. The 52mm trocar site in the epigastrium was closed with a 0 vicryl in the fascia under direct visualization using a PMI device. The abdomen was desufflated and all trocars removed. The skin incisions were closed with subcuticular 4-0 monocryl and Dermabond. The patient was awakened, extubated and transported to the recovery room in stable condition.    All counts were correct at the completion of the case.

## 2021-06-13 NOTE — Progress Notes (Signed)
PROGRESS NOTE    Harold Robinson  LZJ:[id number] DOB: Feb 14, 1971 DOA: 06/11/2021 PCP: Merryl Hacker, No    Brief Narrative:  Harold Robinson is a 50 year old male with past medical history significant for essential hypertension, hyperlipidemia, depression, history of COVID-19 viral infection 1 year ago who presented to [facility] ED on 7/6 with complaint of chest pain.  Patient reports symptoms of chest discomfort, worse with activity and has been present over the last year with recent progression.  In the ED, temperature 97.9 F, HR 69, RR 15, BP 190/90, SPO2 99% on room air.  Sodium 134, potassium 3.9, chloride 102, CO2 24, glucose 130, BUN 12, creatinine 1.11.  WBC 14.9, hemoglobin 14.1, platelets 233.  COVID-19 PCR negative.  Influenza A/B PCR negative.  Urinalysis unrevealing.  High sensitive troponin 5 > 4.  EKG with normal sinus rhythm and no dynamic changes.  CT angio chest/abdomen/pelvis with no aortic dissection or acute aortic findings, minimal aortic atherosclerosis, no pulmonary embolism, no acute abnormality in the chest/abdomen/pelvis, hepatomegaly with hepatic steatosis, cholelithiasis without gallbladder inflammation.  Cardiology was consulted.  Patient was started on nitroglycerin drip.  TRH consulted for further evaluation and management of chest pain concerning for unstable angina and hypertensive urgency.   Assessment & Plan:   Principal Problem:   Chest pain, rule out acute myocardial infarction Active Problems:   Essential hypertension   HLD (hyperlipidemia)   Pulmonary hypertension (HCC)   Unstable angina (HCC)   Abdominal pain   Chest pain Patient presenting to the ED with progressive chest pain over the last year.  Patient is afebrile.  High-sensitivity troponin negative x2.  CTA chest/abdomen/pelvis with no acute abnormality appreciated.  EKG without concerning dynamic findings.  Left heart catheterization with mild/moderate nonobstructive coronary artery disease with 20-30% mid  and distal LAD stenosis and 40% ostial left circumflex lesion with normal LV contraction. --Cardiology following, appreciate assistance --Nitroglycerin drip --Metoprolol succinate 75 mg p.o. daily --Aspirin 81 mg p.o. daily --Atorvastatin 40 mg p.o. daily  Hypertensive urgency In the ED, patient's BP up to 190/90.  Home antihypertensive regimen includes losartan 75 mg p.o. daily, metoprolol succinate 50 mg p.o. daily. --Metoprolol succinate increased to 75 mg p.o. daily --Losartan 75 mg p.o. daily --On nitroglycerin drip as above --Continue monitor BP closely and adjust as needed  Abdominal pain concerning for biliary colic versus cholecystitis Patient complaining intermittent abdominal pain localized to the right upper quadrant and epigastric region.  Patient with elevated WBC count 14.9 on admission.  Total bilirubin 1.3. CT angiogram abdomen/pelvis with calcified gallstone with distended gallbladder with no gallbladder inflammation, pericholecystic fluid or biliary dilation.  Right upper quadrants ultrasound with gallstones without sonographic finding of acute cholecystitis. --Patient continues with pain with oral intake --WBC 14.9>19.8 --Blood cultures x2 --Zosyn --General surgery consulted, plan cholecystectomy today  Hyperlipidemia --Atorvastatin increased to 40 mg p.o. daily  Depression: Sertraline 100 mg p.o. daily   DVT prophylaxis: enoxaparin (LOVENOX) injection 40 mg Start: 06/13/21 0800   Code Status: Full Code Family Communication: Updated family present at bedside this morning.  Disposition Plan:  Level of care: Telemetry Medical Status is: Inpatient  Remains inpatient appropriate because:Ongoing diagnostic testing needed not appropriate for outpatient work up, Unsafe d/c plan, IV treatments appropriate due to intensity of illness or inability to take PO, and Inpatient level of care appropriate due to severity of illness  Dispo: The patient is from: Home               Anticipated  d/c is to: Home              Patient currently is not medically stable to d/c.   Difficult to place patient No   Consultants:  Cardiology General surgery  Procedures:  Left heart catheterization:  Laparoscopic cholecystectomy  Antimicrobials:  None   Subjective: Patient seen examined bedside, continues with pain to right upper quadrant/flank.  Poor oral intake due to pain after eating.  Discussed with patient and family, believe his symptoms are related to gallbladder pathology given symptoms and rise in WBC count.  Consulted general surgery and plan for cholecystectomy today.  Started on Zosyn.  No other questions or concerns at this time. Currently denies headache, no dizziness, no shortness of breath, no abdominal pain, no weakness, no fatigue, no paresthesias.  No acute events overnight per nursing staff.  Objective: Vitals:   06/13/21 0500 06/13/21 0848 06/13/21 1210 06/13/21 1229  BP: 131/62 (!) 141/81 136/69 (!) 130/56  Pulse:  72 68 68  Resp: 17 18 18 18   Temp:  97.6 F (36.4 C) 99.3 F (37.4 C)   TempSrc:  Oral Oral   SpO2: 99% 92% 98% 92%  Weight:      Height:        Intake/Output Summary (Last 24 hours) at 06/13/2021 1341 Last data filed at 06/12/2021 1700 Gross per 24 hour  Intake 195.44 ml  Output 450 ml  Net -254.56 ml   Filed Weights   06/11/21 1635  Weight: 112.5 kg    Examination:  General exam: Appears calm and comfortable  Respiratory system: Clear to auscultation. Respiratory effort normal. Cardiovascular system: S1 & S2 heard, RRR. No JVD, murmurs, rubs, gallops or clicks. No pedal edema. Gastrointestinal system: Abdomen mildly distended, right upper quadrant tenderness with mild guarding. No organomegaly or masses felt. Normal bowel sounds heard. Central nervous system: Alert and oriented. No focal neurological deficits. Extremities: Symmetric 5 x 5 power. Skin: No rashes, lesions or ulcers Psychiatry: Judgement and insight  appear normal. Mood & affect appropriate.     Data Reviewed: I have personally reviewed following labs and imaging studies  CBC: Recent Labs  Lab 06/11/21 1652 06/11/21 1729 06/13/21 0314  WBC  --  14.9* 19.8*  NEUTROABS  --  13.2*  --   HGB 14.6 14.1 14.0  HCT 43.0 42.2 42.1  MCV  --  88.8 88.3  PLT  --  233 702   Basic Metabolic Panel: Recent Labs  Lab 06/11/21 1652 06/11/21 1729 06/13/21 0314  NA 139 134* 131*  K 3.8 3.9 3.6  CL 104 102 97*  CO2  --  24 25  GLUCOSE 132* 130* 162*  BUN 12 12 15   CREATININE 1.00 1.11 1.18  CALCIUM  --  8.9 8.9  MG  --   --  1.7   GFR: Estimated Creatinine Clearance: 97.7 mL/min (by C-G formula based on SCr of 1.18 mg/dL). Liver Function Tests: Recent Labs  Lab 06/11/21 1729 06/13/21 0314  AST 23 14*  ALT 36 27  ALKPHOS 63 64  BILITOT 1.3* 3.1*  PROT 6.7 6.7  ALBUMIN 3.7 3.5   No results for input(s): LIPASE, AMYLASE in the last 168 hours. No results for input(s): AMMONIA in the last 168 hours. Coagulation Profile: No results for input(s): INR, PROTIME in the last 168 hours. Cardiac Enzymes: Recent Labs  Lab 06/12/21 0714  CKTOTAL 71   BNP (last 3 results) No results for input(s): PROBNP in the last 8760 hours. HbA1C:  No results for input(s): HGBA1C in the last 72 hours. CBG: No results for input(s): GLUCAP in the last 168 hours. Lipid Profile: Recent Labs    06/12/21 0714  CHOL 180  HDL 34*  LDLCALC 111*  TRIG 176*  CHOLHDL 5.3   Thyroid Function Tests: No results for input(s): TSH, T4TOTAL, FREET4, T3FREE, THYROIDAB in the last 72 hours. Anemia Panel: No results for input(s): VITAMINB12, FOLATE, FERRITIN, TIBC, IRON, RETICCTPCT in the last 72 hours. Sepsis Labs: No results for input(s): PROCALCITON, LATICACIDVEN in the last 168 hours.  Recent Results (from the past 240 hour(s))  Resp Panel by RT-PCR (Flu A&B, Covid) Nasopharyngeal Swab     Status: None   Collection Time: 06/11/21  4:54 PM    Specimen: Nasopharyngeal Swab; Nasopharyngeal(NP) swabs in vial transport medium  Result Value Ref Range Status   SARS Coronavirus 2 by RT PCR NEGATIVE NEGATIVE Final    Comment: (NOTE) SARS-CoV-2 target nucleic acids are NOT DETECTED.  The SARS-CoV-2 RNA is generally detectable in upper respiratory specimens during the acute phase of infection. The lowest concentration of SARS-CoV-2 viral copies this assay can detect is 138 copies/mL. A negative result does not preclude SARS-Cov-2 infection and should not be used as the sole basis for treatment or other patient management decisions. A negative result may occur with  improper specimen collection/handling, submission of specimen other than nasopharyngeal swab, presence of viral mutation(s) within the areas targeted by this assay, and inadequate number of viral copies(<138 copies/mL). A negative result must be combined with clinical observations, patient history, and epidemiological information. The expected result is Negative.  Fact Sheet for Patients:  EntrepreneurPulse.com.au  Fact Sheet for Healthcare Providers:  IncredibleEmployment.be  This test is no t yet approved or cleared by the [place] FDA and  has been authorized for detection and/or diagnosis of SARS-CoV-2 by FDA under an Emergency Use Authorization (EUA). This EUA will remain  in effect (meaning this test can be used) for the duration of the COVID-19 declaration under Section 564(b)(1) of the Act, 21 U.S.C.section 360bbb-3(b)(1), unless the authorization is terminated  or revoked sooner.       Influenza A by PCR NEGATIVE NEGATIVE Final   Influenza B by PCR NEGATIVE NEGATIVE Final    Comment: (NOTE) The Xpert Xpress SARS-CoV-2/FLU/RSV plus assay is intended as an aid in the diagnosis of influenza from Nasopharyngeal swab specimens and should not be used as a sole basis for treatment. Nasal washings and aspirates are  unacceptable for Xpert Xpress SARS-CoV-2/FLU/RSV testing.  Fact Sheet for Patients: EntrepreneurPulse.com.au  Fact Sheet for Healthcare Providers: IncredibleEmployment.be  This test is not yet approved or cleared by the [place] FDA and has been authorized for detection and/or diagnosis of SARS-CoV-2 by FDA under an Emergency Use Authorization (EUA). This EUA will remain in effect (meaning this test can be used) for the duration of the COVID-19 declaration under Section 564(b)(1) of the Act, 21 U.S.C. section 360bbb-3(b)(1), unless the authorization is terminated or revoked.  Performed at [facility], [place] [address], [place], [place] [address]          Radiology Studies: CARDIAC CATHETERIZATION  Result Date: 06/12/2021 Conclusions: 1. Mild to moderate, non-obstructive coronary artery disease with 20-30% mid and distal LAD stenosis and 40% ostial LCx lesion. 2. Normal left ventricular contraction with mildly elevated filling pressure. Recommendations: 1. Continue medical therapy and risk factor modification.  No severe coronary artery disease identified to explain chest pain.  Currently, patient complains  mostly of lower abdominal pain.  Consider further workup of noncardiac causes of chest/abdominial pain. 2. Wean off nitroglycerin infusion. Nelva Bush, MD [facility]   CT Angio Chest/Abd/Pel for Dissection W and/or Wo Contrast  Result Date: 06/11/2021 CLINICAL DATA:  Chest pain radiating into the back EXAM: CT ANGIOGRAPHY CHEST, ABDOMEN AND PELVIS TECHNIQUE: Non-contrast CT of the chest was initially obtained. Multidetector CT imaging through the chest, abdomen and pelvis was performed using the standard protocol during bolus administration of intravenous contrast. Multiplanar reconstructed images and MIPs were obtained and reviewed to evaluate the vascular anatomy. CONTRAST:  151mL OMNIPAQUE IOHEXOL 350 MG/ML SOLN COMPARISON:   Chest CT 10/16/2020.  No interval chest imaging. FINDINGS: CTA CHEST FINDINGS Cardiovascular: No aortic dissection. No aortic hematoma on noncontrast exam. Normal caliber thoracic aorta. Common origin of the brachiocephalic and left common carotid artery there is no central pulmonary embolus to the segmental level. Heart is normal in size. No pericardial effusion. There are coronary artery calcifications. Mediastinum/Nodes: No mediastinal or hilar adenopathy. No thyroid nodule. Decompressed esophagus. Lungs/Pleura: No acute airspace disease, pulmonary edema, or pleural effusion. Trachea and central bronchi are patent. Previous 2-3 mm pulmonary nodules are less well-defined on the current exam due to motion. Stable 3 mm right middle lobe nodule, series 7, image 48. Musculoskeletal: There are no acute or suspicious osseous abnormalities. Review of the MIP images confirms the above findings. CTA ABDOMEN AND PELVIS FINDINGS VASCULAR Aorta: Normal caliber aorta without aneurysm, dissection, vasculitis or significant stenosis. Minimal aortic atherosclerosis. Celiac: Patent without evidence of aneurysm, dissection, vasculitis or significant stenosis. SMA: Patent without evidence of aneurysm, dissection, vasculitis or significant stenosis. Replaced left hepatic artery arises from the SMA. Renals: 2 codominant right renal arteries. Single left renal artery. All renal arteries are patent. IMA: Patent without evidence of aneurysm, dissection, vasculitis or significant stenosis. Inflow: Patent without evidence of aneurysm, dissection, vasculitis or significant stenosis. Veins: No obvious venous abnormality within the limitations of this arterial phase study. No portal venous or mesenteric gas. Review of the MIP images confirms the above findings. NON-VASCULAR Hepatobiliary: Hepatomegaly and hepatic steatosis, the liver spans 23.8 cm cranial caudal. No focal hepatic lesion. Calcified gallstone within physiologically distended  gallbladder. No pericholecystic inflammation. No biliary dilatation. Pancreas: No ductal dilatation or inflammation. Spleen: Normal in size and arterial enhancement. Small splenule anteriorly. Adrenals/Urinary Tract: Normal adrenal glands. No hydronephrosis or perinephric edema. There are small low-density lesions and within both kidneys, too small to accurately characterize but likely small cysts. Unremarkable urinary bladder. Stomach/Bowel: Decompressed stomach. There is no small bowel obstruction or inflammation. Normal appendix. Submucosal fatty infiltration of the cecum, ascending, and transverse colon without acute inflammatory change. Scattered left colonic diverticulosis without diverticulitis. No acute colonic inflammation. Lymphatic: No bulky abdominopelvic adenopathy. Reproductive: Prostate is unremarkable. Other: No ascites or free air. Small fat containing inguinal hernias. Tiny fat containing umbilical hernia. 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 There are no acute or suspicious osseous abnormalities. Review of the MIP images confirms the above findings. IMPRESSION: 1. No aortic dissection or acute aortic findings. Minimal aortic atherosclerosis. No pulmonary embolus. 2. No acute abnormality in the chest, abdomen, or pelvis. 3. Hepatomegaly and hepatic steatosis. 4. Cholelithiasis without gallbladder inflammation. 5. Colonic diverticulosis without diverticulitis. 6. 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 Aortic Atherosclerosis (ICD10-I70.0). Electronically Signed   By: Keith Rake M.D.   On: 06/11/2021 17:57   US Abdomen Limited RUQ (LIVER/GB)  Result Date: 06/12/2021 CLINICAL DATA:  Abdominal  pain EXAM: ULTRASOUND ABDOMEN LIMITED RIGHT UPPER QUADRANT COMPARISON:  None. FINDINGS: Gallbladder: Physiologically distended. Rounded echogenic structure without shadowing in the gallbladder likely corresponds to stone seen on prior CT. No gallbladder wall  thickening. No sonographic Murphy sign noted by sonographer. Common bile duct: Diameter: 4-5 mm. Liver: Heterogeneous and increased parenchymal echogenicity. No discrete focal lesion. Liver parenchyma is difficult to penetrate. Portal vein is patent on color Doppler imaging with normal direction of blood flow towards the liver. Other: No right upper quadrant ascites. IMPRESSION: 1. Gallstone without sonographic findings of acute cholecystitis. 2. Hepatic steatosis. Electronically Signed   By: Keith Rake M.D.   On: 06/12/2021 19:56        Scheduled Meds:  [MAR Hold] aspirin EC  81 mg Oral Daily   [MAR Hold] atorvastatin  40 mg Oral QHS   [MAR Hold] enoxaparin (LOVENOX) injection  40 mg Subcutaneous Q24H   [MAR Hold] losartan  75 mg Oral QHS   [MAR Hold] metoprolol succinate  75 mg Oral QHS   [MAR Hold] montelukast  10 mg Oral QHS   [MAR Hold] sertraline  100 mg Oral QHS   [MAR Hold] sodium chloride flush  3 mL Intravenous Q12H   Continuous Infusions:  [MAR Hold] sodium chloride     sodium chloride 75 mL/hr at 06/13/21 1047   lactated ringers 10 mL/hr at 06/13/21 1237   nitroGLYCERIN Stopped (06/12/21 1500)   [MAR Hold] piperacillin-tazobactam (ZOSYN)  IV 3.375 g (06/13/21 0840)     LOS: 0 days    Time spent: 41 minutes spent on chart review, discussion with nursing staff, consultants, updating family and interview/physical exam; more than 50% of that time was spent in counseling and/or coordination of care.    Retina Bernardy J [place] DO [facility] Available via Epic secure chat 7am-7pm After these hours, please refer to coverage provider listed on [email or web address] 06/13/2021, 1:41 PM

## 2021-06-14 ENCOUNTER — Encounter (HOSPITAL_COMMUNITY): Payer: Self-pay | Admitting: Surgery

## 2021-06-14 ENCOUNTER — Inpatient Hospital Stay (HOSPITAL_COMMUNITY): Payer: BC Managed Care – PPO

## 2021-06-14 DIAGNOSIS — R079 Chest pain, unspecified: Secondary | ICD-10-CM | POA: Diagnosis not present

## 2021-06-14 LAB — CBC
HCT: 41 % (ref 39.0–52.0)
Hemoglobin: 13.3 g/dL (ref 13.0–17.0)
MCH: 29.2 pg (ref 26.0–34.0)
MCHC: 32.4 g/dL (ref 30.0–36.0)
MCV: 89.9 fL (ref 80.0–100.0)
Platelets: 219 10*3/uL (ref 150–400)
RBC: 4.56 MIL/uL (ref 4.22–5.81)
RDW: 13.9 % (ref 11.5–15.5)
WBC: 16.3 10*3/uL — ABNORMAL HIGH (ref 4.0–10.5)
nRBC: 0 % (ref 0.0–0.2)

## 2021-06-14 LAB — COMPREHENSIVE METABOLIC PANEL
ALT: 56 U/L — ABNORMAL HIGH (ref 0–44)
AST: 40 U/L (ref 15–41)
Albumin: 2.9 g/dL — ABNORMAL LOW (ref 3.5–5.0)
Alkaline Phosphatase: 58 U/L (ref 38–126)
Anion gap: 6 (ref 5–15)
BUN: 22 mg/dL — ABNORMAL HIGH (ref 6–20)
CO2: 27 mmol/L (ref 22–32)
Calcium: 8.5 mg/dL — ABNORMAL LOW (ref 8.9–10.3)
Chloride: 98 mmol/L (ref 98–111)
Creatinine, Ser: 1.36 mg/dL — ABNORMAL HIGH (ref 0.61–1.24)
GFR, Estimated: >60 mL/min (ref >60)
Glucose, Bld: 174 mg/dL — ABNORMAL HIGH (ref 70–99)
Potassium: 3.7 mmol/L (ref 3.5–5.1)
Sodium: 131 mmol/L — ABNORMAL LOW (ref 135–145)
Total Bilirubin: 1.5 mg/dL — ABNORMAL HIGH (ref 0.3–1.2)
Total Protein: 6.4 g/dL — ABNORMAL LOW (ref 6.5–8.1)

## 2021-06-14 NOTE — Progress Notes (Signed)
1 Day Post-Op   Subjective/Chief Complaint: Patient sore, but much less pain when compared to pre-op No nausea or vomiting MRCP - negative for choledocholithiasis T. Bili improved   Objective: Vital signs in last 24 hours: Temp:  [98.2 F (36.8 C)-99.3 F (37.4 C)] 98.2 F (36.8 C) (07/09 0424) Pulse Rate:  [67-77] 67 (07/09 0424) Resp:  [16-28] 19 (07/09 0424) BP: (110-140)/(56-79) 133/61 (07/09 0424) SpO2:  [90 %-98 %] 92 % (07/09 0424)    Intake/Output from previous day: 07/08 0701 - 07/09 0700 In: 969.8 [I.V.:924.9; IV Piggyback:44.9] Out: 255 [Urine:250; Blood:5] Intake/Output this shift: No intake/output data recorded.  PE Overweight male in NAD Abd - obese, soft, incisional tenderness Laparoscopic incisions c/d/I  Lab Results:  Recent Labs    06/13/21 0314 06/14/21 0115  WBC 19.8* 16.3*  HGB 14.0 13.3  HCT 42.1 41.0  PLT 216 219   BMET Recent Labs    06/13/21 0314 06/14/21 0115  NA 131* 131*  K 3.6 3.7  CL 97* 98  CO2 25 27  GLUCOSE 162* 174*  BUN 15 22*  CREATININE 1.18 1.36*  CALCIUM 8.9 8.5*   Hepatic Function Latest Ref Rng & Units 06/14/2021 06/13/2021 06/11/2021  Total Protein 6.5 - 8.1 g/dL 6.4(L) 6.7 6.7  Albumin 3.5 - 5.0 g/dL 2.9(L) 3.5 3.7  AST 15 - 41 U/L 40 14(L) 23  ALT 0 - 44 U/L 56(H) 27 36  Alk Phosphatase 38 - 126 U/L 58 64 63  Total Bilirubin 0.3 - 1.2 mg/dL 1.5(H) 3.1(H) 1.3(H)  Bilirubin, Direct 0.0 - 0.3 mg/dL - - -    PT/INR No results for input(s): LABPROT, INR in the last 72 hours. ABG No results for input(s): PHART, HCO3 in the last 72 hours.  Invalid input(s): PCO2, PO2  Studies/Results: CARDIAC CATHETERIZATION  Result Date: 06/12/2021 Conclusions: 1. Mild to moderate, non-obstructive coronary artery disease with 20-30% mid and distal LAD stenosis and 40% ostial LCx lesion. 2. Normal left ventricular contraction with mildly elevated filling pressure. Recommendations: 1. Continue medical therapy and risk factor  modification.  No severe coronary artery disease identified to explain chest pain.  Currently, patient complains mostly of lower abdominal pain.  Consider further workup of noncardiac causes of chest/abdominial pain. 2. Wean off nitroglycerin infusion. Nelva Bush, MD [facility]   MR ABDOMEN MRCP W WO CONTAST  Result Date: 06/13/2021 CLINICAL DATA:  Cholelithiasis, elevated bilirubin, status post lap cholecystectomy EXAM: MRI ABDOMEN WITHOUT AND WITH CONTRAST (INCLUDING MRCP) TECHNIQUE: Multiplanar multisequence MR imaging of the abdomen was performed both before and after the administration of intravenous contrast. Heavily T2-weighted images of the biliary and pancreatic ducts were obtained, and three-dimensional MRCP images were rendered by post processing. CONTRAST:  9.33m GADAVIST GADOBUTROL 1 MMOL/ML IV SOLN COMPARISON:  Right upper quadrant ultrasound, 06/12/2021, CT chest abdomen pelvis angiogram, 06/11/2021 FINDINGS: Lower chest: No acute findings. Hepatobiliary: No mass or other parenchymal abnormality identified. Status post interval cholecystectomy. There is mild fat stranding and minimal fluid in the gallbladder fossa. No biliary ductal dilatation. Pancreas: No mass, inflammatory changes, or other parenchymal abnormality identified. No pancreatic ductal dilatation. Spleen:  Within normal limits in size and appearance. Adrenals/Urinary Tract: No masses identified. No evidence of hydronephrosis. Stomach/Bowel: Visualized portions within the abdomen are unremarkable. Vascular/Lymphatic: No pathologically enlarged lymph nodes identified. No abdominal aortic aneurysm demonstrated. Other:  None. Musculoskeletal: No suspicious bone lesions identified. IMPRESSION: Status post interval cholecystectomy. There is mild fat stranding and minimal postoperative fluid in the gallbladder  fossa. No biliary ductal dilatation or other abnormality. Electronically Signed   By: Eddie Candle M.D.   On: 06/13/2021  20:40   US Abdomen Limited RUQ (LIVER/GB)  Result Date: 06/12/2021 CLINICAL DATA:  Abdominal pain EXAM: ULTRASOUND ABDOMEN LIMITED RIGHT UPPER QUADRANT COMPARISON:  None. FINDINGS: Gallbladder: Physiologically distended. Rounded echogenic structure without shadowing in the gallbladder likely corresponds to stone seen on prior CT. No gallbladder wall thickening. No sonographic Murphy sign noted by sonographer. Common bile duct: Diameter: 4-5 mm. Liver: Heterogeneous and increased parenchymal echogenicity. No discrete focal lesion. Liver parenchyma is difficult to penetrate. Portal vein is patent on color Doppler imaging with normal direction of blood flow towards the liver. Other: No right upper quadrant ascites. IMPRESSION: 1. Gallstone without sonographic findings of acute cholecystitis. 2. Hepatic steatosis. Electronically Signed   By: Keith Rake M.D.   On: 06/12/2021 19:56    Anti-infectives: Anti-infectives (From admission, onward)    Start     Dose/Rate Route Frequency Ordered Stop   06/13/21 0800  piperacillin-tazobactam (ZOSYN) IVPB 3.375 g        3.375 g 12.5 mL/hr over 240 Minutes Intravenous Every 8 hours 06/13/21 0740         Assessment/Plan: Acute cholecystitis s/p lap chole 06/13/21 Normal MRCP Improving LFT's, WBC  Advance diet as tolerated Repeat labs in AM Continue Zosyn until WBC normal  Possible discharge tomorrow.  LOS: 1 day    Maia Petties 06/14/2021

## 2021-06-14 NOTE — Progress Notes (Signed)
PROGRESS NOTE    Harold Robinson  YQM:[id number] DOB: 1971-09-30 DOA: 06/11/2021 PCP: Merryl Hacker, No    Brief Narrative:  Harold Robinson is a 50 year old male with past medical history significant for essential hypertension, hyperlipidemia, depression, history of COVID-19 viral infection 1 year ago who presented to [facility] ED on 7/6 with complaint of chest pain.  Patient reports symptoms of chest discomfort, worse with activity and has been present over the last year with recent progression.  In the ED, temperature 97.9 F, HR 69, RR 15, BP 190/90, SPO2 99% on room air.  Sodium 134, potassium 3.9, chloride 102, CO2 24, glucose 130, BUN 12, creatinine 1.11.  WBC 14.9, hemoglobin 14.1, platelets 233.  COVID-19 PCR negative.  Influenza A/B PCR negative.  Urinalysis unrevealing.  High sensitive troponin 5 > 4.  EKG with normal sinus rhythm and no dynamic changes.  CT angio chest/abdomen/pelvis with no aortic dissection or acute aortic findings, minimal aortic atherosclerosis, no pulmonary embolism, no acute abnormality in the chest/abdomen/pelvis, hepatomegaly with hepatic steatosis, cholelithiasis without gallbladder inflammation.  Cardiology was consulted.  Patient was started on nitroglycerin drip.  TRH consulted for further evaluation and management of chest pain concerning for unstable angina and hypertensive urgency.   Assessment & Plan:   Principal Problem:   Chest pain, rule out acute myocardial infarction Active Problems:   Essential hypertension   HLD (hyperlipidemia)   Pulmonary hypertension (HCC)   Unstable angina (HCC)   Abdominal pain    Acute cholecystitis Patient complaining intermittent abdominal pain localized to the right upper quadrant and epigastric region.  Patient with elevated WBC count 14.9 on admission.  Total bilirubin 1.3. CT angiogram abdomen/pelvis with calcified gallstone with distended gallbladder with no gallbladder inflammation, pericholecystic fluid or biliary dilation.   Right upper quadrants ultrasound with gallstones without sonographic finding of acute cholecystitis.  Patient was evaluated by general surgery and taken to the OR on 06/13/2021 for laparoscopic cholecystectomy.  Unable to perform intraoperative cholangiogram due to stone in the cystic duct.  MRCP negative for biliary duct dilation or other abnormalities. --General surgery following, appreciate assistance --WBC 14.9>19.8>16.3 --Tbil 3.1>1.5 --Blood cultures x2: no growth <24hrs --Continue Zosyn --Advance diet today per general surgery --Repeat CBC and CMP in the a.m.  Chest pain Patient presenting to the ED with progressive chest pain over the last year.  Patient is afebrile.  High-sensitivity troponin negative x2.  CTA chest/abdomen/pelvis with no acute abnormality appreciated.  EKG without concerning dynamic findings.  Left heart catheterization with mild/moderate nonobstructive coronary artery disease with 20-30% mid and distal LAD stenosis and 40% ostial left circumflex lesion with normal LV contraction. --Cardiology following, appreciate assistance --Nitroglycerin drip --Metoprolol succinate 75 mg p.o. daily --Aspirin 81 mg p.o. daily --Atorvastatin 40 mg p.o. daily  Hypertensive urgency In the ED, patient's BP up to 190/90.  Home antihypertensive regimen includes losartan 75 mg p.o. daily, metoprolol succinate 50 mg p.o. daily. --Metoprolol succinate increased to 75 mg p.o. daily --Losartan 75 mg p.o. daily --On nitroglycerin drip as above --Continue monitor BP closely and adjust as needed  Hyperlipidemia --Atorvastatin increased to 40 mg p.o. daily  Depression: Sertraline 100 mg p.o. daily   DVT prophylaxis: enoxaparin (LOVENOX) injection 40 mg Start: 06/13/21 0800   Code Status: Full Code Family Communication: Updated family present at bedside this morning.  Disposition Plan:  Level of care: Telemetry Medical Status is: Inpatient  Remains inpatient appropriate  because:Ongoing diagnostic testing needed not appropriate for outpatient work up, Unsafe d/c  plan, IV treatments appropriate due to intensity of illness or inability to take PO, and Inpatient level of care appropriate due to severity of illness  Dispo: The patient is from: Home              Anticipated d/c is to: Home              Patient currently is not medically stable to d/c.   Difficult to place patient No   Consultants:  Cardiology General surgery  Procedures:  Left heart catheterization  Laparoscopic cholecystectomy 7/8  Antimicrobials:  Zosyn 7/8>>   Subjective: Patient seen examined bedside, complains of soreness but abdominal discomfort much improved following cholecystectomy yesterday.  Remains afebrile, WBC count and total bilirubin trending down.  Seen by general surgery this morning, advancing diet.  Surgery wants patient remain hospitalized for least 1 more day to ensure tolerates diet and to continue Zosyn to ensure WBC count continues to improve.  Family present at bedside, no other questions or concerns at this time. Currently denies headache, no dizziness, no shortness of breath, no abdominal pain, no weakness, no fatigue, no paresthesias.  No acute events overnight per nursing staff.  Objective: Vitals:   06/13/21 1645 06/13/21 2149 06/14/21 0013 06/14/21 0424  BP: 134/79 131/72 140/72 133/61  Pulse: 69 76 70 67  Resp: 20 16 16 19   Temp: 99 F (37.2 C) 98.7 F (37.1 C) 98.5 F (36.9 C) 98.2 F (36.8 C)  TempSrc: Oral Oral Oral Oral  SpO2: 94% 95% 96% 92%  Weight:      Height:        Intake/Output Summary (Last 24 hours) at 06/14/2021 1316 Last data filed at 06/14/2021 1000 Gross per 24 hour  Intake 1449.79 ml  Output 430 ml  Net 1019.79 ml   Filed Weights   06/11/21 1635  Weight: 112.5 kg    Examination:  General exam: Appears calm and comfortable  Respiratory system: Clear to auscultation. Respiratory effort normal. Cardiovascular system: S1 &  S2 heard, RRR. No JVD, murmurs, rubs, gallops or clicks. No pedal edema. Gastrointestinal system: Abdomen soft, NTTP, no rebound/guarding/masses, laparoscopic port sites noted; normal bowel sounds.   Central nervous system: Alert and oriented. No focal neurological deficits. Extremities: Symmetric 5 x 5 power. Skin: No rashes, lesions or ulcers Psychiatry: Judgement and insight appear normal. Mood & affect appropriate.     Data Reviewed: I have personally reviewed following labs and imaging studies  CBC: Recent Labs  Lab 06/11/21 1652 06/11/21 1729 06/13/21 0314 06/14/21 0115  WBC  --  14.9* 19.8* 16.3*  NEUTROABS  --  13.2*  --   --   HGB 14.6 14.1 14.0 13.3  HCT 43.0 42.2 42.1 41.0  MCV  --  88.8 88.3 89.9  PLT  --  233 216 761   Basic Metabolic Panel: Recent Labs  Lab 06/11/21 1652 06/11/21 1729 06/13/21 0314 06/14/21 0115  NA 139 134* 131* 131*  K 3.8 3.9 3.6 3.7  CL 104 102 97* 98  CO2  --  24 25 27   GLUCOSE 132* 130* 162* 174*  BUN 12 12 15  22*  CREATININE 1.00 1.11 1.18 1.36*  CALCIUM  --  8.9 8.9 8.5*  MG  --   --  1.7  --    GFR: Estimated Creatinine Clearance: 84.7 mL/min (A) (by C-G formula based on SCr of 1.36 mg/dL (H)). Liver Function Tests: Recent Labs  Lab 06/11/21 1729 06/13/21 0314 06/14/21 0115  AST 23 14*  40  ALT 36 27 56*  ALKPHOS 63 64 58  BILITOT 1.3* 3.1* 1.5*  PROT 6.7 6.7 6.4*  ALBUMIN 3.7 3.5 2.9*   No results for input(s): LIPASE, AMYLASE in the last 168 hours. No results for input(s): AMMONIA in the last 168 hours. Coagulation Profile: No results for input(s): INR, PROTIME in the last 168 hours. Cardiac Enzymes: Recent Labs  Lab 06/12/21 0714  CKTOTAL 71   BNP (last 3 results) No results for input(s): PROBNP in the last 8760 hours. HbA1C: No results for input(s): HGBA1C in the last 72 hours. CBG: No results for input(s): GLUCAP in the last 168 hours. Lipid Profile: Recent Labs    06/12/21 0714  CHOL 180  HDL  34*  LDLCALC 111*  TRIG 176*  CHOLHDL 5.3   Thyroid Function Tests: No results for input(s): TSH, T4TOTAL, FREET4, T3FREE, THYROIDAB in the last 72 hours. Anemia Panel: No results for input(s): VITAMINB12, FOLATE, FERRITIN, TIBC, IRON, RETICCTPCT in the last 72 hours. Sepsis Labs: No results for input(s): PROCALCITON, LATICACIDVEN in the last 168 hours.  Recent Results (from the past 240 hour(s))  Resp Panel by RT-PCR (Flu A&B, Covid) Nasopharyngeal Swab     Status: None   Collection Time: 06/11/21  4:54 PM   Specimen: Nasopharyngeal Swab; Nasopharyngeal(NP) swabs in vial transport medium  Result Value Ref Range Status   SARS Coronavirus 2 by RT PCR NEGATIVE NEGATIVE Final    Comment: (NOTE) SARS-CoV-2 target nucleic acids are NOT DETECTED.  The SARS-CoV-2 RNA is generally detectable in upper respiratory specimens during the acute phase of infection. The lowest concentration of SARS-CoV-2 viral copies this assay can detect is 138 copies/mL. A negative result does not preclude SARS-Cov-2 infection and should not be used as the sole basis for treatment or other patient management decisions. A negative result may occur with  improper specimen collection/handling, submission of specimen other than nasopharyngeal swab, presence of viral mutation(s) within the areas targeted by this assay, and inadequate number of viral copies(<138 copies/mL). A negative result must be combined with clinical observations, patient history, and epidemiological information. The expected result is Negative.  Fact Sheet for Patients:  EntrepreneurPulse.com.au  Fact Sheet for Healthcare Providers:  IncredibleEmployment.be  This test is no t yet approved or cleared by the [place] FDA and  has been authorized for detection and/or diagnosis of SARS-CoV-2 by FDA under an Emergency Use Authorization (EUA). This EUA will remain  in effect (meaning this test can be  used) for the duration of the COVID-19 declaration under Section 564(b)(1) of the Act, 21 U.S.C.section 360bbb-3(b)(1), unless the authorization is terminated  or revoked sooner.       Influenza A by PCR NEGATIVE NEGATIVE Final   Influenza B by PCR NEGATIVE NEGATIVE Final    Comment: (NOTE) The Xpert Xpress SARS-CoV-2/FLU/RSV plus assay is intended as an aid in the diagnosis of influenza from Nasopharyngeal swab specimens and should not be used as a sole basis for treatment. Nasal washings and aspirates are unacceptable for Xpert Xpress SARS-CoV-2/FLU/RSV testing.  Fact Sheet for Patients: EntrepreneurPulse.com.au  Fact Sheet for Healthcare Providers: IncredibleEmployment.be  This test is not yet approved or cleared by the [place] FDA and has been authorized for detection and/or diagnosis of SARS-CoV-2 by FDA under an Emergency Use Authorization (EUA). This EUA will remain in effect (meaning this test can be used) for the duration of the COVID-19 declaration under Section 564(b)(1) of the Act, 21 U.S.C. section 360bbb-3(b)(1), unless the authorization  is terminated or revoked.  Performed at [facility], [place] [address], [place], [place] [address]   Culture, blood (routine x 2)     Status: None (Preliminary result)   Collection Time: 06/13/21  8:25 AM   Specimen: BLOOD LEFT HAND  Result Value Ref Range Status   Specimen Description BLOOD LEFT HAND  Final   Special Requests   Final    BOTTLES DRAWN AEROBIC AND ANAEROBIC Blood Culture adequate volume   Culture   Final    NO GROWTH < 24 HOURS Performed at [facility], [place] [address], [place], [place] [address]    Report Status PENDING  Incomplete  Culture, blood (routine x 2)     Status: None (Preliminary result)   Collection Time: 06/13/21  8:30 AM   Specimen: BLOOD RIGHT HAND  Result Value Ref Range Status   Specimen Description BLOOD RIGHT HAND  Final   Special  Requests   Final    BOTTLES DRAWN AEROBIC ONLY Blood Culture results may not be optimal due to an inadequate volume of blood received in culture bottles   Culture   Final    NO GROWTH < 24 HOURS Performed at [facility], [place] [address] [place], [place] [address]    Report Status PENDING  Incomplete  Surgical pcr screen     Status: None   Collection Time: 06/13/21 12:22 PM   Specimen: Nasal Mucosa; Nasal Swab  Result Value Ref Range Status   MRSA, PCR NEGATIVE NEGATIVE Final   Staphylococcus aureus NEGATIVE NEGATIVE Final    Comment: (NOTE) The Xpert SA Assay (FDA approved for NASAL specimens in patients 39 years of age and older), is one component of a comprehensive surveillance program. It is not intended to diagnose infection nor to guide or monitor treatment. Performed at [facility], [place] [address], [place],  [address]          Radiology Studies: MR ABDOMEN MRCP W WO CONTAST  Result Date: 06/13/2021 CLINICAL DATA:  Cholelithiasis, elevated bilirubin, status post lap cholecystectomy EXAM: MRI ABDOMEN WITHOUT AND WITH CONTRAST (INCLUDING MRCP) TECHNIQUE: Multiplanar multisequence MR imaging of the abdomen was performed both before and after the administration of intravenous contrast. Heavily T2-weighted images of the biliary and pancreatic ducts were obtained, and three-dimensional MRCP images were rendered by post processing. CONTRAST:  9.36mL GADAVIST GADOBUTROL 1 MMOL/ML IV SOLN COMPARISON:  Right upper quadrant ultrasound, 06/12/2021, CT chest abdomen pelvis angiogram, 06/11/2021 FINDINGS: Lower chest: No acute findings. Hepatobiliary: No mass or other parenchymal abnormality identified. Status post interval cholecystectomy. There is mild fat stranding and minimal fluid in the gallbladder fossa. No biliary ductal dilatation. Pancreas: No mass, inflammatory changes, or other parenchymal abnormality identified. No pancreatic ductal dilatation. Spleen:  Within  normal limits in size and appearance. Adrenals/Urinary Tract: No masses identified. No evidence of hydronephrosis. Stomach/Bowel: Visualized portions within the abdomen are unremarkable. Vascular/Lymphatic: No pathologically enlarged lymph nodes identified. No abdominal aortic aneurysm demonstrated. Other:  None. Musculoskeletal: No suspicious bone lesions identified. IMPRESSION: Status post interval cholecystectomy. There is mild fat stranding and minimal postoperative fluid in the gallbladder fossa. No biliary ductal dilatation or other abnormality. Electronically Signed   By: Eddie Candle M.D.   On: 06/13/2021 20:40   US Abdomen Limited RUQ (LIVER/GB)  Result Date: 06/12/2021 CLINICAL DATA:  Abdominal pain EXAM: ULTRASOUND ABDOMEN LIMITED RIGHT UPPER QUADRANT COMPARISON:  None. FINDINGS: Gallbladder: Physiologically distended. Rounded echogenic structure without shadowing in the  gallbladder likely corresponds to stone seen on prior CT. No gallbladder wall thickening. No sonographic Murphy sign noted by sonographer. Common bile duct: Diameter: 4-5 mm. Liver: Heterogeneous and increased parenchymal echogenicity. No discrete focal lesion. Liver parenchyma is difficult to penetrate. Portal vein is patent on color Doppler imaging with normal direction of blood flow towards the liver. Other: No right upper quadrant ascites. IMPRESSION: 1. Gallstone without sonographic findings of acute cholecystitis. 2. Hepatic steatosis. Electronically Signed   By: Keith Rake M.D.   On: 06/12/2021 19:56        Scheduled Meds:  aspirin EC  81 mg Oral Daily   atorvastatin  40 mg Oral QHS   enoxaparin (LOVENOX) injection  40 mg Subcutaneous Q24H   gabapentin  300 mg Oral TID   losartan  75 mg Oral QHS   metoprolol succinate  75 mg Oral QHS   montelukast  10 mg Oral QHS   sertraline  100 mg Oral QHS   sodium chloride flush  3 mL Intravenous Q12H   Continuous Infusions:  sodium chloride 250 mL (06/14/21 0205)    sodium chloride 75 mL/hr at 06/13/21 1047   methocarbamol (ROBAXIN) IV     nitroGLYCERIN Stopped (06/12/21 1500)   piperacillin-tazobactam (ZOSYN)  IV 3.375 g (06/14/21 0836)     LOS: 1 day    Time spent: 38 minutes spent on chart review, discussion with nursing staff, consultants, updating family and interview/physical exam; more than 50% of that time was spent in counseling and/or coordination of care.    Garv Kuechle J [place] DO [facility] Available via Epic secure chat 7am-7pm After these hours, please refer to coverage provider listed on [email or web address] 06/14/2021, 1:16 PM

## 2021-06-15 DIAGNOSIS — R079 Chest pain, unspecified: Secondary | ICD-10-CM | POA: Diagnosis not present

## 2021-06-15 LAB — CBC
HCT: 39.6 % (ref 39.0–52.0)
Hemoglobin: 12.8 g/dL — ABNORMAL LOW (ref 13.0–17.0)
MCH: 29.2 pg (ref 26.0–34.0)
MCHC: 32.3 g/dL (ref 30.0–36.0)
MCV: 90.2 fL (ref 80.0–100.0)
Platelets: 232 10*3/uL (ref 150–400)
RBC: 4.39 MIL/uL (ref 4.22–5.81)
RDW: 13.6 % (ref 11.5–15.5)
WBC: 13.8 10*3/uL — ABNORMAL HIGH (ref 4.0–10.5)
nRBC: 0 % (ref 0.0–0.2)

## 2021-06-15 LAB — COMPREHENSIVE METABOLIC PANEL
ALT: 80 U/L — ABNORMAL HIGH (ref 0–44)
AST: 50 U/L — ABNORMAL HIGH (ref 15–41)
Albumin: 2.6 g/dL — ABNORMAL LOW (ref 3.5–5.0)
Alkaline Phosphatase: 57 U/L (ref 38–126)
Anion gap: 8 (ref 5–15)
BUN: 20 mg/dL (ref 6–20)
CO2: 26 mmol/L (ref 22–32)
Calcium: 8.2 mg/dL — ABNORMAL LOW (ref 8.9–10.3)
Chloride: 96 mmol/L — ABNORMAL LOW (ref 98–111)
Creatinine, Ser: 1.34 mg/dL — ABNORMAL HIGH (ref 0.61–1.24)
GFR, Estimated: >60 mL/min (ref >60)
Glucose, Bld: 145 mg/dL — ABNORMAL HIGH (ref 70–99)
Potassium: 3.4 mmol/L — ABNORMAL LOW (ref 3.5–5.1)
Sodium: 130 mmol/L — ABNORMAL LOW (ref 135–145)
Total Bilirubin: 1.3 mg/dL — ABNORMAL HIGH (ref 0.3–1.2)
Total Protein: 6.2 g/dL — ABNORMAL LOW (ref 6.5–8.1)

## 2021-06-15 MED ORDER — ATORVASTATIN CALCIUM 40 MG PO TABS: 40.0000 mg | ORAL_TABLET | Freq: Every day | ORAL | 0 refills | Status: DC

## 2021-06-15 MED ORDER — METOPROLOL SUCCINATE ER 25 MG PO TB24: 75.0000 mg | ORAL_TABLET | Freq: Every day | ORAL | 2 refills | Status: DC

## 2021-06-15 MED ORDER — OXYCODONE HCL 5 MG PO TABS: 5.0000 mg | ORAL_TABLET | Freq: Four times a day (QID) | ORAL | 0 refills | Status: DC | PRN

## 2021-06-15 MED ORDER — AMOXICILLIN-POT CLAVULANATE 875-125 MG PO TABS: 1.0000 | ORAL_TABLET | Freq: Two times a day (BID) | ORAL | 0 refills | Status: AC

## 2021-06-15 MED ORDER — POTASSIUM CHLORIDE CRYS ER 20 MEQ PO TBCR
30.0000 meq | EXTENDED_RELEASE_TABLET | ORAL | Status: AC
  Administered 2021-06-15 (×2): 30 meq via ORAL
  Filled 2021-06-15 (×2): qty 1

## 2021-06-15 NOTE — Discharge Summary (Signed)
Physician Discharge Summary  Harold Robinson EGB:[id number] DOB: 16-Mar-1971 DOA: 06/11/2021  PCP: Merryl Hacker, No  Admit date: 06/11/2021 Discharge date: 06/15/2021  Admitted From: Home Disposition: Home  Recommendations for Outpatient Follow-up:  Follow up with PCP in 1-2 weeks Follow-up with cardiology Recommend follow-up with his primary pulmonologist regarding his chronic dyspnea Follow-up with general surgery 3 weeks for postoperative check Metoprolol succinate increased to 75 mg p.o. daily by cardiology  Home Health: No Equipment/Devices: None  Discharge Condition: Stable CODE STATUS: Full code Diet recommendation: Heart healthy diet  History of present illness:  Harold Robinson is a 50 year old male with past medical history significant for essential hypertension, hyperlipidemia, depression, history of COVID-19 viral infection 1 year ago who presented to [facility] ED on 7/6 with complaint of chest pain.  Patient reports symptoms of chest discomfort, worse with activity and has been present over the last year with recent progression.   In the ED, temperature 97.9 F, HR 69, RR 15, BP 190/90, SPO2 99% on room air.  Sodium 134, potassium 3.9, chloride 102, CO2 24, glucose 130, BUN 12, creatinine 1.11.  WBC 14.9, hemoglobin 14.1, platelets 233.  COVID-19 PCR negative.  Influenza A/B PCR negative.  Urinalysis unrevealing.  High sensitive troponin 5 > 4.  EKG with normal sinus rhythm and no dynamic changes.  CT angio chest/abdomen/pelvis with no aortic dissection or acute aortic findings, minimal aortic atherosclerosis, no pulmonary embolism, no acute abnormality in the chest/abdomen/pelvis, hepatomegaly with hepatic steatosis, cholelithiasis without gallbladder inflammation.  Cardiology was consulted.  Patient was started on nitroglycerin drip.  TRH consulted for further evaluation and management of chest pain concerning for unstable angina and hypertensive urgency.  Hospital course:  Acute  cholecystitis Patient complaining intermittent abdominal pain localized to the right upper quadrant and epigastric region.  Patient with elevated WBC count 14.9 on admission.  Total bilirubin 1.3. CT angiogram abdomen/pelvis with calcified gallstone with distended gallbladder with no gallbladder inflammation, pericholecystic fluid or biliary dilation.  Right upper quadrants ultrasound with gallstones without sonographic finding of acute cholecystitis.  Patient was evaluated by general surgery and taken to the OR on 06/13/2021 for laparoscopic cholecystectomy.  Unable to perform intraoperative cholangiogram due to stone in the cystic duct.  MRCP negative for biliary duct dilation or other abnormalities.  Patient's bilirubin and white blood cell count have trended down during hospitalization.  Patient's Zosyn was transitioned to Augmentin to complete additional 7 days per general surgery.  Follow-up with general surgery 3 weeks for postoperative check.   Atypical chest pain Nonobstructive CAD Patient presenting to the ED with progressive chest pain over the last year.  Patient is afebrile.  High-sensitivity troponin negative x2.  CTA chest/abdomen/pelvis with no acute abnormality appreciated.  EKG without concerning dynamic findings.  Left heart catheterization with mild/moderate nonobstructive coronary artery disease with 20-30% mid and distal LAD stenosis and 40% ostial left circumflex lesion with normal LV contraction.  Metoprolol succinate was increased from 50 to 75 mg p.o. daily.  Continue losartan.  Continue aspirin and statin.  Outpatient follow-up with cardiology.   Hypertensive urgency In the ED, patient's BP up to 190/90.  Home antihypertensive regimen includes losartan 75 mg p.o. daily, metoprolol succinate 50 mg p.o. daily. Metoprolol succinate increased to 75 mg p.o. daily, continue losartan.  Outpatient follow-up with PCP/cardiology.   Hyperlipidemia Atorvastatin increased to 40 mg p.o. daily    Depression: Sertraline 100 mg p.o. daily  Discharge Diagnoses:  Principal Problem:   Chest pain, rule out  acute myocardial infarction Active Problems:   Essential hypertension   HLD (hyperlipidemia)   Pulmonary hypertension (HCC)   Unstable angina (HCC)   Abdominal pain    Discharge Instructions  Discharge Instructions     Call MD for:  persistant nausea and vomiting   Complete by: As directed    Call MD for:  redness, tenderness, or signs of infection (pain, swelling, redness, odor or green/yellow discharge around incision site)   Complete by: As directed    Call MD for:  severe uncontrolled pain   Complete by: As directed    Call MD for:  temperature >100.4   Complete by: As directed    Diet general   Complete by: As directed    Driving Restrictions   Complete by: As directed    Do not drive while taking pain medications   Increase activity slowly   Complete by: As directed    May shower / Bathe   Complete by: As directed       Allergies as of 06/15/2021   No Known Allergies      Medication List     TAKE these medications    amoxicillin-clavulanate 875-125 MG tablet Commonly known as: Augmentin Take 1 tablet by mouth 2 (two) times daily for 7 days.   atorvastatin 40 MG tablet Commonly known as: LIPITOR Take 1 tablet (40 mg total) by mouth at bedtime. What changed:  medication strength how much to take when to take this   losartan 50 MG tablet Commonly known as: COZAAR TAKE 1 & 1/2 TABLETS (75 MG TOTAL) BY MOUTH DAILY. MUST SCHEDULE PHYSICAL What changed: See the new instructions.   metoprolol succinate 25 MG 24 hr tablet Commonly known as: TOPROL-XL Take 3 tablets (75 mg total) by mouth at bedtime. Take with or immediately following a meal. What changed:  medication strength how much to take how to take this when to take this additional instructions   montelukast 10 MG tablet Commonly known as: SINGULAIR TAKE 1 TABLET BY MOUTH EVERYDAY  AT BEDTIME What changed: See the new instructions.   oxyCODONE 5 MG immediate release tablet Commonly known as: Oxy IR/ROXICODONE Take 1 tablet (5 mg total) by mouth every 6 (six) hours as needed for severe pain.   sertraline 100 MG tablet Commonly known as: ZOLOFT TAKE 1 TABLET (100 MG TOTAL) BY MOUTH DAILY. SCHEDULE PHYSICAL EXAM What changed:  when to take this additional instructions        Follow-up Information     [facility] Schedule an appointment as soon as possible for a visit in 3 week(s).   Specialty: General Surgery Contact information: [address] [place]        Freada Bergeron, MD. Schedule an appointment as soon as possible for a visit.   Specialties: Cardiology, Radiology Contact information: [address] [place] [address] [phone]                No Known Allergies  Consultations: Cardiology General surgery   Procedures/Studies: DG Chest 2 View  Result Date: 06/14/2021 CLINICAL DATA:  Shortness of breath and chest pain EXAM: CHEST - 2 VIEW COMPARISON:  06/11/2021 CT FINDINGS: Cardiac shadow is enlarged. Lungs are hypoinflated with crowding of the vascular markings. No interstitial edema is seen. No sizable effusion is noted. No bony abnormality is seen. IMPRESSION: Poor inspiratory effort with crowding of the vascular markings. No focal infiltrate is  noted. Electronically Signed   By: Inez Catalina M.D.   On: 06/14/2021 20:22   CARDIAC CATHETERIZATION  Result Date: 06/12/2021 Conclusions: 1. Mild to moderate, non-obstructive coronary artery disease with 20-30% mid and distal LAD stenosis and 40% ostial LCx lesion. 2. Normal left ventricular contraction with mildly elevated filling pressure. Recommendations: 1. Continue medical therapy and risk factor modification.  No severe coronary artery disease identified to explain chest pain.   Currently, patient complains mostly of lower abdominal pain.  Consider further workup of noncardiac causes of chest/abdominial pain. 2. Wean off nitroglycerin infusion. Nelva Bush, MD [facility]   MR ABDOMEN MRCP W WO CONTAST  Result Date: 06/13/2021 CLINICAL DATA:  Cholelithiasis, elevated bilirubin, status post lap cholecystectomy EXAM: MRI ABDOMEN WITHOUT AND WITH CONTRAST (INCLUDING MRCP) TECHNIQUE: Multiplanar multisequence MR imaging of the abdomen was performed both before and after the administration of intravenous contrast. Heavily T2-weighted images of the biliary and pancreatic ducts were obtained, and three-dimensional MRCP images were rendered by post processing. CONTRAST:  9.89mL GADAVIST GADOBUTROL 1 MMOL/ML IV SOLN COMPARISON:  Right upper quadrant ultrasound, 06/12/2021, CT chest abdomen pelvis angiogram, 06/11/2021 FINDINGS: Lower chest: No acute findings. Hepatobiliary: No mass or other parenchymal abnormality identified. Status post interval cholecystectomy. There is mild fat stranding and minimal fluid in the gallbladder fossa. No biliary ductal dilatation. Pancreas: No mass, inflammatory changes, or other parenchymal abnormality identified. No pancreatic ductal dilatation. Spleen:  Within normal limits in size and appearance. Adrenals/Urinary Tract: No masses identified. No evidence of hydronephrosis. Stomach/Bowel: Visualized portions within the abdomen are unremarkable. Vascular/Lymphatic: No pathologically enlarged lymph nodes identified. No abdominal aortic aneurysm demonstrated. Other:  None. Musculoskeletal: No suspicious bone lesions identified. IMPRESSION: Status post interval cholecystectomy. There is mild fat stranding and minimal postoperative fluid in the gallbladder fossa. No biliary ductal dilatation or other abnormality. Electronically Signed   By: Eddie Candle M.D.   On: 06/13/2021 20:40   CT Angio Chest/Abd/Pel for Dissection W and/or Wo Contrast  Result Date:  06/11/2021 CLINICAL DATA:  Chest pain radiating into the back EXAM: CT ANGIOGRAPHY CHEST, ABDOMEN AND PELVIS TECHNIQUE: Non-contrast CT of the chest was initially obtained. Multidetector CT imaging through the chest, abdomen and pelvis was performed using the standard protocol during bolus administration of intravenous contrast. Multiplanar reconstructed images and MIPs were obtained and reviewed to evaluate the vascular anatomy. CONTRAST:  14mL OMNIPAQUE IOHEXOL 350 MG/ML SOLN COMPARISON:  Chest CT 10/16/2020.  No interval chest imaging. FINDINGS: CTA CHEST FINDINGS Cardiovascular: No aortic dissection. No aortic hematoma on noncontrast exam. Normal caliber thoracic aorta. Common origin of the brachiocephalic and left common carotid artery there is no central pulmonary embolus to the segmental level. Heart is normal in size. No pericardial effusion. There are coronary artery calcifications. Mediastinum/Nodes: No mediastinal or hilar adenopathy. No thyroid nodule. Decompressed esophagus. Lungs/Pleura: No acute airspace disease, pulmonary edema, or pleural effusion. Trachea and central bronchi are patent. Previous 2-3 mm pulmonary nodules are less well-defined on the current exam due to motion. Stable 3 mm right middle lobe nodule, series 7, image 48. Musculoskeletal: There are no acute or suspicious osseous abnormalities. Review of the MIP images confirms the above findings. CTA ABDOMEN AND PELVIS FINDINGS VASCULAR Aorta: Normal caliber aorta without aneurysm, dissection, vasculitis or significant stenosis. Minimal aortic atherosclerosis. Celiac: Patent without evidence of aneurysm, dissection, vasculitis or significant stenosis. SMA: Patent without evidence of aneurysm, dissection, vasculitis or significant stenosis. Replaced left hepatic artery arises from the SMA. Renals: 2 codominant right  renal arteries. Single left renal artery. All renal arteries are patent. IMA: Patent without evidence of aneurysm,  dissection, vasculitis or significant stenosis. Inflow: Patent without evidence of aneurysm, dissection, vasculitis or significant stenosis. Veins: No obvious venous abnormality within the limitations of this arterial phase study. No portal venous or mesenteric gas. Review of the MIP images confirms the above findings. NON-VASCULAR Hepatobiliary: Hepatomegaly and hepatic steatosis, the liver spans 23.8 cm cranial caudal. No focal hepatic lesion. Calcified gallstone within physiologically distended gallbladder. No pericholecystic inflammation. No biliary dilatation. Pancreas: No ductal dilatation or inflammation. Spleen: Normal in size and arterial enhancement. Small splenule anteriorly. Adrenals/Urinary Tract: Normal adrenal glands. No hydronephrosis or perinephric edema. There are small low-density lesions and within both kidneys, too small to accurately characterize but likely small cysts. Unremarkable urinary bladder. Stomach/Bowel: Decompressed stomach. There is no small bowel obstruction or inflammation. Normal appendix. Submucosal fatty infiltration of the cecum, ascending, and transverse colon without acute inflammatory change. Scattered left colonic diverticulosis without diverticulitis. No acute colonic inflammation. Lymphatic: No bulky abdominopelvic adenopathy. Reproductive: Prostate is unremarkable. Other: No ascites or free air. Small fat containing inguinal hernias. Tiny fat containing umbilical hernia. 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 There are no acute or suspicious osseous abnormalities. Review of the MIP images confirms the above findings. IMPRESSION: 1. No aortic dissection or acute aortic findings. Minimal aortic atherosclerosis. No pulmonary embolus. 2. No acute abnormality in the chest, abdomen, or pelvis. 3. Hepatomegaly and hepatic steatosis. 4. Cholelithiasis without gallbladder inflammation. 5. Colonic diverticulosis without diverticulitis. 6. 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 Aortic Atherosclerosis (ICD10-I70.0). Electronically Signed   By: Keith Rake M.D.   On: 06/11/2021 17:57   US Abdomen Limited RUQ (LIVER/GB)  Result Date: 06/12/2021 CLINICAL DATA:  Abdominal pain EXAM: ULTRASOUND ABDOMEN LIMITED RIGHT UPPER QUADRANT COMPARISON:  None. FINDINGS: Gallbladder: Physiologically distended. Rounded echogenic structure without shadowing in the gallbladder likely corresponds to stone seen on prior CT. No gallbladder wall thickening. No sonographic Murphy sign noted by sonographer. Common bile duct: Diameter: 4-5 mm. Liver: Heterogeneous and increased parenchymal echogenicity. No discrete focal lesion. Liver parenchyma is difficult to penetrate. Portal vein is patent on color Doppler imaging with normal direction of blood flow towards the liver. Other: No right upper quadrant ascites. IMPRESSION: 1. Gallstone without sonographic findings of acute cholecystitis. 2. Hepatic steatosis. Electronically Signed   By: Keith Rake M.D.   On: 06/12/2021 19:56     Subjective: Patient seen examined bedside, resting comfortably.  Abdominal discomfort improved.  Tolerating diet.  Spouse present at bedside.  Seen by general surgery this morning and okay for discharge home on 1 week Augmentin and outpatient follow-up scheduled in 3 weeks.  No other questions or concerns at this time.  Denies headache, no fever/chills/night sweats, no nausea/vomiting/diarrhea, no chest pain, no palpitations, no shortness of breath more than his typical chronic dyspnea at baseline, no weakness, no fatigue, no paresthesias.  No acute events overnight per nursing staff.  Discharge Exam: Vitals:   06/14/21 1953 06/15/21 0500  BP: 134/75 131/77  Pulse: 74 67  Resp: 16 16  Temp: 99.5 F (37.5 C) 99.1 F (37.3 C)  SpO2: 92% 93%   Vitals:   06/14/21 0424 06/14/21 1411 06/14/21 1953 06/15/21 0500  BP: 133/61 114/77 134/75 131/77  Pulse: 67 68 74 67  Resp: 19  16 16 16   Temp: 98.2 F (36.8 C) 99.7 F (37.6 C) 99.5 F (37.5 C) 99.1 F (37.3 C)  TempSrc: Oral Oral Oral Oral  SpO2: 92% 96% 92% 93%  Weight:      Height:        General: Pt is alert, awake, not in acute distress Cardiovascular: RRR, S1/S2 +, no rubs, no gallops Respiratory: CTA bilaterally, no wheezing, no rhonchi, on room air Abdominal: Soft, NT, ND, bowel sounds +, surgical port sites noted, clean/dry/intact Extremities: no edema, no cyanosis    The results of significant diagnostics from this hospitalization (including imaging, microbiology, ancillary and laboratory) are listed below for reference.     Microbiology: Recent Results (from the past 240 hour(s))  Resp Panel by RT-PCR (Flu A&B, Covid) Nasopharyngeal Swab     Status: None   Collection Time: 06/11/21  4:54 PM   Specimen: Nasopharyngeal Swab; Nasopharyngeal(NP) swabs in vial transport medium  Result Value Ref Range Status   SARS Coronavirus 2 by RT PCR NEGATIVE NEGATIVE Final    Comment: (NOTE) SARS-CoV-2 target nucleic acids are NOT DETECTED.  The SARS-CoV-2 RNA is generally detectable in upper respiratory specimens during the acute phase of infection. The lowest concentration of SARS-CoV-2 viral copies this assay can detect is 138 copies/mL. A negative result does not preclude SARS-Cov-2 infection and should not be used as the sole basis for treatment or other patient management decisions. A negative result may occur with  improper specimen collection/handling, submission of specimen other than nasopharyngeal swab, presence of viral mutation(s) within the areas targeted by this assay, and inadequate number of viral copies(<138 copies/mL). A negative result must be combined with clinical observations, patient history, and epidemiological information. The expected result is Negative.  Fact Sheet for Patients:  EntrepreneurPulse.com.au  Fact Sheet for Healthcare Providers:   IncredibleEmployment.be  This test is no t yet approved or cleared by the [place] FDA and  has been authorized for detection and/or diagnosis of SARS-CoV-2 by FDA under an Emergency Use Authorization (EUA). This EUA will remain  in effect (meaning this test can be used) for the duration of the COVID-19 declaration under Section 564(b)(1) of the Act, 21 U.S.C.section 360bbb-3(b)(1), unless the authorization is terminated  or revoked sooner.       Influenza A by PCR NEGATIVE NEGATIVE Final   Influenza B by PCR NEGATIVE NEGATIVE Final    Comment: (NOTE) The Xpert Xpress SARS-CoV-2/FLU/RSV plus assay is intended as an aid in the diagnosis of influenza from Nasopharyngeal swab specimens and should not be used as a sole basis for treatment. Nasal washings and aspirates are unacceptable for Xpert Xpress SARS-CoV-2/FLU/RSV testing.  Fact Sheet for Patients: EntrepreneurPulse.com.au  Fact Sheet for Healthcare Providers: IncredibleEmployment.be  This test is not yet approved or cleared by the [place] FDA and has been authorized for detection and/or diagnosis of SARS-CoV-2 by FDA under an Emergency Use Authorization (EUA). This EUA will remain in effect (meaning this test can be used) for the duration of the COVID-19 declaration under Section 564(b)(1) of the Act, 21 U.S.C. section 360bbb-3(b)(1), unless the authorization is terminated or revoked.  Performed at [facility], [place] [address] [place], [place] [address]   Culture, blood (routine x 2)     Status: None (Preliminary result)   Collection Time: 06/13/21  8:25 AM   Specimen: BLOOD LEFT HAND  Result Value Ref Range Status   Specimen Description BLOOD LEFT HAND  Final   Special Requests   Final    BOTTLES DRAWN AEROBIC AND ANAEROBIC Blood Culture adequate volume   Culture   Final  NO GROWTH 2 DAYS Performed at [facility], [place] [address]  [place], [place] [address]    Report Status PENDING  Incomplete  Culture, blood (routine x 2)     Status: None (Preliminary result)   Collection Time: 06/13/21  8:30 AM   Specimen: BLOOD RIGHT HAND  Result Value Ref Range Status   Specimen Description BLOOD RIGHT HAND  Final   Special Requests   Final    BOTTLES DRAWN AEROBIC ONLY Blood Culture results may not be optimal due to an inadequate volume of blood received in culture bottles   Culture   Final    NO GROWTH 2 DAYS Performed at [facility], [place] [address], [place], [place] [address]    Report Status PENDING  Incomplete  Surgical pcr screen     Status: None   Collection Time: 06/13/21 12:22 PM   Specimen: Nasal Mucosa; Nasal Swab  Result Value Ref Range Status   MRSA, PCR NEGATIVE NEGATIVE Final   Staphylococcus aureus NEGATIVE NEGATIVE Final    Comment: (NOTE) The Xpert SA Assay (FDA approved for NASAL specimens in patients 69 years of age and older), is one component of a comprehensive surveillance program. It is not intended to diagnose infection nor to guide or monitor treatment. Performed at [facility], [place] [address], [place],  [address]      Labs: BNP (last 3 results) No results for input(s): BNP in the last 8760 hours. Basic Metabolic Panel: Recent Labs  Lab 06/11/21 1652 06/11/21 1729 06/13/21 0314 06/14/21 0115 06/15/21 0200  NA 139 134* 131* 131* 130*  K 3.8 3.9 3.6 3.7 3.4*  CL 104 102 97* 98 96*  CO2  --  24 25 27 26   GLUCOSE 132* 130* 162* 174* 145*  BUN 12 12 15  22* 20  CREATININE 1.00 1.11 1.18 1.36* 1.34*  CALCIUM  --  8.9 8.9 8.5* 8.2*  MG  --   --  1.7  --   --    Liver Function Tests: Recent Labs  Lab 06/11/21 1729 06/13/21 0314 06/14/21 0115 06/15/21 0200  AST 23 14* 40 50*  ALT 36 27 56* 80*  ALKPHOS 63 64 58 57  BILITOT 1.3* 3.1* 1.5* 1.3*  PROT 6.7 6.7 6.4* 6.2*  ALBUMIN 3.7 3.5 2.9* 2.6*   No results for input(s): LIPASE, AMYLASE in the last 168  hours. No results for input(s): AMMONIA in the last 168 hours. CBC: Recent Labs  Lab 06/11/21 1652 06/11/21 1729 06/13/21 0314 06/14/21 0115 06/15/21 0200  WBC  --  14.9* 19.8* 16.3* 13.8*  NEUTROABS  --  13.2*  --   --   --   HGB 14.6 14.1 14.0 13.3 12.8*  HCT 43.0 42.2 42.1 41.0 39.6  MCV  --  88.8 88.3 89.9 90.2  PLT  --  233 216 219 232   Cardiac Enzymes: Recent Labs  Lab 06/12/21 0714  CKTOTAL 71   BNP: Invalid input(s): POCBNP CBG: No results for input(s): GLUCAP in the last 168 hours. D-Dimer No results for input(s): DDIMER in the last 72 hours. Hgb A1c No results for input(s): HGBA1C in the last 72 hours. Lipid Profile No results for input(s): CHOL, HDL, LDLCALC, TRIG, CHOLHDL, LDLDIRECT in the last 72 hours. Thyroid function studies No results for input(s): TSH, T4TOTAL, T3FREE, THYROIDAB in the last 72 hours.  Invalid input(s): FREET3 Anemia work up No results for input(s): VITAMINB12, FOLATE, FERRITIN, TIBC, IRON, RETICCTPCT in the  last 72 hours. Urinalysis    Component Value Date/Time   COLORURINE YELLOW 06/11/2021 1857   APPEARANCEUR CLEAR 06/11/2021 1857   LABSPEC >1.046 (H) 06/11/2021 1857   PHURINE 5.0 06/11/2021 1857   GLUCOSEU NEGATIVE 06/11/2021 1857   HGBUR NEGATIVE 06/11/2021 1857   BILIRUBINUR NEGATIVE 06/11/2021 1857   KETONESUR NEGATIVE 06/11/2021 1857   PROTEINUR NEGATIVE 06/11/2021 1857   UROBILINOGEN 0.2 08/07/2013 0005   NITRITE NEGATIVE 06/11/2021 1857   LEUKOCYTESUR NEGATIVE 06/11/2021 1857   Sepsis Labs Invalid input(s): PROCALCITONIN,  WBC,  LACTICIDVEN Microbiology Recent Results (from the past 240 hour(s))  Resp Panel by RT-PCR (Flu A&B, Covid) Nasopharyngeal Swab     Status: None   Collection Time: 06/11/21  4:54 PM   Specimen: Nasopharyngeal Swab; Nasopharyngeal(NP) swabs in vial transport medium  Result Value Ref Range Status   SARS Coronavirus 2 by RT PCR NEGATIVE NEGATIVE Final    Comment: (NOTE) SARS-CoV-2 target  nucleic acids are NOT DETECTED.  The SARS-CoV-2 RNA is generally detectable in upper respiratory specimens during the acute phase of infection. The lowest concentration of SARS-CoV-2 viral copies this assay can detect is 138 copies/mL. A negative result does not preclude SARS-Cov-2 infection and should not be used as the sole basis for treatment or other patient management decisions. A negative result may occur with  improper specimen collection/handling, submission of specimen other than nasopharyngeal swab, presence of viral mutation(s) within the areas targeted by this assay, and inadequate number of viral copies(<138 copies/mL). A negative result must be combined with clinical observations, patient history, and epidemiological information. The expected result is Negative.  Fact Sheet for Patients:  EntrepreneurPulse.com.au  Fact Sheet for Healthcare Providers:  IncredibleEmployment.be  This test is no t yet approved or cleared by the [place] FDA and  has been authorized for detection and/or diagnosis of SARS-CoV-2 by FDA under an Emergency Use Authorization (EUA). This EUA will remain  in effect (meaning this test can be used) for the duration of the COVID-19 declaration under Section 564(b)(1) of the Act, 21 U.S.C.section 360bbb-3(b)(1), unless the authorization is terminated  or revoked sooner.       Influenza A by PCR NEGATIVE NEGATIVE Final   Influenza B by PCR NEGATIVE NEGATIVE Final    Comment: (NOTE) The Xpert Xpress SARS-CoV-2/FLU/RSV plus assay is intended as an aid in the diagnosis of influenza from Nasopharyngeal swab specimens and should not be used as a sole basis for treatment. Nasal washings and aspirates are unacceptable for Xpert Xpress SARS-CoV-2/FLU/RSV testing.  Fact Sheet for Patients: EntrepreneurPulse.com.au  Fact Sheet for Healthcare  Providers: IncredibleEmployment.be  This test is not yet approved or cleared by the [place] FDA and has been authorized for detection and/or diagnosis of SARS-CoV-2 by FDA under an Emergency Use Authorization (EUA). This EUA will remain in effect (meaning this test can be used) for the duration of the COVID-19 declaration under Section 564(b)(1) of the Act, 21 U.S.C. section 360bbb-3(b)(1), unless the authorization is terminated or revoked.  Performed at [facility], [place] [address] [place], [place] [address]   Culture, blood (routine x 2)     Status: None (Preliminary result)   Collection Time: 06/13/21  8:25 AM   Specimen: BLOOD LEFT HAND  Result Value Ref Range Status   Specimen Description BLOOD LEFT HAND  Final   Special Requests   Final    BOTTLES DRAWN AEROBIC AND ANAEROBIC Blood Culture adequate volume   Culture   Final  NO GROWTH 2 DAYS Performed at [facility], [place] [address] [place], [place] [address]    Report Status PENDING  Incomplete  Culture, blood (routine x 2)     Status: None (Preliminary result)   Collection Time: 06/13/21  8:30 AM   Specimen: BLOOD RIGHT HAND  Result Value Ref Range Status   Specimen Description BLOOD RIGHT HAND  Final   Special Requests   Final    BOTTLES DRAWN AEROBIC ONLY Blood Culture results may not be optimal due to an inadequate volume of blood received in culture bottles   Culture   Final    NO GROWTH 2 DAYS Performed at [facility], [place] [address] [place], [place] [address]    Report Status PENDING  Incomplete  Surgical pcr screen     Status: None   Collection Time: 06/13/21 12:22 PM   Specimen: Nasal Mucosa; Nasal Swab  Result Value Ref Range Status   MRSA, PCR NEGATIVE NEGATIVE Final   Staphylococcus aureus NEGATIVE NEGATIVE Final    Comment: (NOTE) The Xpert SA Assay (FDA approved for NASAL specimens in patients 76 years of age and older), is one component of a  comprehensive surveillance program. It is not intended to diagnose infection nor to guide or monitor treatment. Performed at [facility], [place] [address], [place], [place] [address]      Time coordinating discharge: Over 30 minutes  SIGNED:   Elisa Kutner J [place] DO  [facility] 06/15/2021, 9:33 AM

## 2021-06-15 NOTE — Progress Notes (Signed)
2 Days Post-Op   Subjective/Chief Complaint: Patient feeling much better Minimal pain Tolerating diet T. Bili continues to improve   Objective: Vital signs in last 24 hours: Temp:  [99.1 F (37.3 C)-99.7 F (37.6 C)] 99.1 F (37.3 C) (07/10 0500) Pulse Rate:  [67-74] 67 (07/10 0500) Resp:  [16] 16 (07/10 0500) BP: (114-134)/(75-77) 131/77 (07/10 0500) SpO2:  [92 %-96 %] 93 % (07/10 0500) Last BM Date: 06/14/21  Intake/Output from previous day: 07/09 0701 - 07/10 0700 In: 1200 [P.O.:1200] Out: 976 [Urine:975; Stool:1] Intake/Output this shift: Total I/O In: 500 [P.O.:500] Out: 69 [Urine:280]  Overweight male in NAD Abd - obese, soft, incisional tenderness Laparoscopic incisions c/d/I  Lab Results:  Recent Labs    06/14/21 0115 06/15/21 0200  WBC 16.3* 13.8*  HGB 13.3 12.8*  HCT 41.0 39.6  PLT 219 232   BMET Recent Labs    06/14/21 0115 06/15/21 0200  NA 131* 130*  K 3.7 3.4*  CL 98 96*  CO2 27 26  GLUCOSE 174* 145*  BUN 22* 20  CREATININE 1.36* 1.34*  CALCIUM 8.5* 8.2*   Hepatic Function Latest Ref Rng & Units 06/15/2021 06/14/2021 06/13/2021  Total Protein 6.5 - 8.1 g/dL 6.2(L) 6.4(L) 6.7  Albumin 3.5 - 5.0 g/dL 2.6(L) 2.9(L) 3.5  AST 15 - 41 U/L 50(H) 40 14(L)  ALT 0 - 44 U/L 80(H) 56(H) 27  Alk Phosphatase 38 - 126 U/L 57 58 64  Total Bilirubin 0.3 - 1.2 mg/dL 1.3(H) 1.5(H) 3.1(H)  Bilirubin, Direct 0.0 - 0.3 mg/dL - - -    PT/INR No results for input(s): LABPROT, INR in the last 72 hours. ABG No results for input(s): PHART, HCO3 in the last 72 hours.  Invalid input(s): PCO2, PO2  Studies/Results: DG Chest 2 View  Result Date: 06/14/2021 CLINICAL DATA:  Shortness of breath and chest pain EXAM: CHEST - 2 VIEW COMPARISON:  06/11/2021 CT FINDINGS: Cardiac shadow is enlarged. Lungs are hypoinflated with crowding of the vascular markings. No interstitial edema is seen. No sizable effusion is noted. No bony abnormality is seen. IMPRESSION: Poor  inspiratory effort with crowding of the vascular markings. No focal infiltrate is noted. Electronically Signed   By: Inez Catalina M.D.   On: 06/14/2021 20:22   MR ABDOMEN MRCP W WO CONTAST  Result Date: 06/13/2021 CLINICAL DATA:  Cholelithiasis, elevated bilirubin, status post lap cholecystectomy EXAM: MRI ABDOMEN WITHOUT AND WITH CONTRAST (INCLUDING MRCP) TECHNIQUE: Multiplanar multisequence MR imaging of the abdomen was performed both before and after the administration of intravenous contrast. Heavily T2-weighted images of the biliary and pancreatic ducts were obtained, and three-dimensional MRCP images were rendered by post processing. CONTRAST:  9.49mL GADAVIST GADOBUTROL 1 MMOL/ML IV SOLN COMPARISON:  Right upper quadrant ultrasound, 06/12/2021, CT chest abdomen pelvis angiogram, 06/11/2021 FINDINGS: Lower chest: No acute findings. Hepatobiliary: No mass or other parenchymal abnormality identified. Status post interval cholecystectomy. There is mild fat stranding and minimal fluid in the gallbladder fossa. No biliary ductal dilatation. Pancreas: No mass, inflammatory changes, or other parenchymal abnormality identified. No pancreatic ductal dilatation. Spleen:  Within normal limits in size and appearance. Adrenals/Urinary Tract: No masses identified. No evidence of hydronephrosis. Stomach/Bowel: Visualized portions within the abdomen are unremarkable. Vascular/Lymphatic: No pathologically enlarged lymph nodes identified. No abdominal aortic aneurysm demonstrated. Other:  None. Musculoskeletal: No suspicious bone lesions identified. IMPRESSION: Status post interval cholecystectomy. There is mild fat stranding and minimal postoperative fluid in the gallbladder fossa. No biliary ductal dilatation or other abnormality.  Electronically Signed   By: Eddie Candle M.D.   On: 06/13/2021 20:40    Anti-infectives: Anti-infectives (From admission, onward)    Start     Dose/Rate Route Frequency Ordered Stop    06/15/21 0000  amoxicillin-clavulanate (AUGMENTIN) 875-125 MG tablet        1 tablet Oral 2 times daily 06/15/21 0852 06/22/21 2359   06/13/21 0800  piperacillin-tazobactam (ZOSYN) IVPB 3.375 g        3.375 g 12.5 mL/hr over 240 Minutes Intravenous Every 8 hours 06/13/21 0740         Assessment/Plan: Acute cholecystitis s/p lap chole 06/13/21 Normal MRCP - improving LFT's, WBC  Discharge on PO Augmentin 1 week Follow-up [facility] 3 weeks PRN pain meds  LOS: 2 days    Maia Petties 06/15/2021

## 2021-06-16 LAB — SURGICAL PATHOLOGY

## 2021-06-18 LAB — CULTURE, BLOOD (ROUTINE X 2)
Culture: NO GROWTH
Culture: NO GROWTH
Special Requests: ADEQUATE

## 2021-07-12 ENCOUNTER — Other Ambulatory Visit: Payer: Self-pay | Admitting: Family

## 2021-07-20 ENCOUNTER — Other Ambulatory Visit: Payer: Self-pay | Admitting: Internal Medicine

## 2021-07-20 DIAGNOSIS — F419 Anxiety disorder, unspecified: Secondary | ICD-10-CM

## 2021-07-20 DIAGNOSIS — F32A Depression, unspecified: Secondary | ICD-10-CM

## 2021-07-21 ENCOUNTER — Ambulatory Visit: Payer: BC Managed Care – PPO | Admitting: Cardiology

## 2021-07-21 NOTE — Telephone Encounter (Signed)
Does this patient have TOC with [facility]?

## 2021-07-21 NOTE — Telephone Encounter (Signed)
He has a follow-up with Dr Silvio Pate. I will verify if he is taking him as a patient and correct the chart.

## 2021-08-22 ENCOUNTER — Ambulatory Visit (HOSPITAL_BASED_OUTPATIENT_CLINIC_OR_DEPARTMENT_OTHER): Payer: BC Managed Care – PPO | Admitting: Family

## 2021-09-08 ENCOUNTER — Ambulatory Visit: Payer: Self-pay | Admitting: Internal Medicine

## 2021-10-23 ENCOUNTER — Other Ambulatory Visit: Payer: Self-pay | Admitting: Internal Medicine

## 2021-10-23 DIAGNOSIS — F419 Anxiety disorder, unspecified: Secondary | ICD-10-CM

## 2021-11-06 ENCOUNTER — Other Ambulatory Visit: Payer: Self-pay | Admitting: Internal Medicine

## 2021-11-06 DIAGNOSIS — F32A Depression, unspecified: Secondary | ICD-10-CM

## 2021-11-06 DIAGNOSIS — F419 Anxiety disorder, unspecified: Secondary | ICD-10-CM

## 2021-12-09 ENCOUNTER — Telehealth: Payer: Self-pay

## 2021-12-09 DIAGNOSIS — F32A Depression, unspecified: Secondary | ICD-10-CM

## 2021-12-09 DIAGNOSIS — F419 Anxiety disorder, unspecified: Secondary | ICD-10-CM

## 2021-12-09 MED ORDER — METOPROLOL SUCCINATE ER 25 MG PO TB24: 75.0000 mg | ORAL_TABLET | Freq: Every day | ORAL | 0 refills | Status: DC

## 2021-12-09 MED ORDER — SERTRALINE HCL 100 MG PO TABS: 100.0000 mg | ORAL_TABLET | Freq: Every day | ORAL | 0 refills | Status: DC

## 2021-12-09 NOTE — Telephone Encounter (Signed)
1.Medication Requested: sertraline (ZOLOFT) 100 MG tablet  metoprolol succinate (TOPROL-XL) 25 MG 24 hr tablet (Expired) 2. Pharmacy (Name, Street, [place]):  [id number] - [place], [place] 3. On Med List: yes   4. Last Visit with PCP: 6.30.22 last seen with [facility]  5. Next visit date with PCP: scheduled for 3.3.23 with [facility]    Agent: Please be advised that RX refills may take up to 3 business days. We ask that you follow-up with your pharmacy.

## 2021-12-26 ENCOUNTER — Other Ambulatory Visit: Payer: Self-pay | Admitting: Internal Medicine

## 2021-12-26 DIAGNOSIS — F419 Anxiety disorder, unspecified: Secondary | ICD-10-CM

## 2021-12-26 DIAGNOSIS — F32A Depression, unspecified: Secondary | ICD-10-CM

## 2022-01-14 ENCOUNTER — Institutional Professional Consult (permissible substitution): Payer: BC Managed Care – PPO | Admitting: Pulmonary Disease

## 2022-02-06 ENCOUNTER — Ambulatory Visit (INDEPENDENT_AMBULATORY_CARE_PROVIDER_SITE_OTHER): Payer: BC Managed Care – PPO | Admitting: Internal Medicine

## 2022-02-06 ENCOUNTER — Other Ambulatory Visit: Payer: Self-pay

## 2022-02-06 ENCOUNTER — Encounter: Payer: Self-pay | Admitting: Internal Medicine

## 2022-02-06 VITALS — BP 130/80 | HR 54 | Temp 97.7°F | Ht 71.0 in | Wt 244.0 lb

## 2022-02-06 DIAGNOSIS — F39 Unspecified mood [affective] disorder: Secondary | ICD-10-CM

## 2022-02-06 DIAGNOSIS — I10 Essential (primary) hypertension: Secondary | ICD-10-CM | POA: Diagnosis not present

## 2022-02-06 DIAGNOSIS — F419 Anxiety disorder, unspecified: Secondary | ICD-10-CM

## 2022-02-06 DIAGNOSIS — I2510 Atherosclerotic heart disease of native coronary artery without angina pectoris: Secondary | ICD-10-CM

## 2022-02-06 DIAGNOSIS — F32A Depression, unspecified: Secondary | ICD-10-CM

## 2022-02-06 LAB — LIPID PANEL
Cholesterol: 203 mg/dL — ABNORMAL HIGH (ref 0–200)
HDL: 33.4 mg/dL — ABNORMAL LOW (ref >39.00)
NonHDL: 169.8
Total CHOL/HDL Ratio: 6
Triglycerides: 208 mg/dL — ABNORMAL HIGH (ref 0.0–149.0)
VLDL: 41.6 mg/dL — ABNORMAL HIGH (ref 0.0–40.0)

## 2022-02-06 LAB — CBC
HCT: 45.4 % (ref 39.0–52.0)
Hemoglobin: 15 g/dL (ref 13.0–17.0)
MCHC: 33 g/dL (ref 30.0–36.0)
MCV: 88.6 fl (ref 78.0–100.0)
Platelets: 248 10*3/uL (ref 150.0–400.0)
RBC: 5.13 Mil/uL (ref 4.22–5.81)
RDW: 14.2 % (ref 11.5–15.5)
WBC: 7.5 10*3/uL (ref 4.0–10.5)

## 2022-02-06 LAB — COMPREHENSIVE METABOLIC PANEL
ALT: 39 U/L (ref 0–53)
AST: 22 U/L (ref 0–37)
Albumin: 4.3 g/dL (ref 3.5–5.2)
Alkaline Phosphatase: 66 U/L (ref 39–117)
BUN: 16 mg/dL (ref 6–23)
CO2: 28 mEq/L (ref 19–32)
Calcium: 9.3 mg/dL (ref 8.4–10.5)
Chloride: 102 mEq/L (ref 96–112)
Creatinine, Ser: 1.24 mg/dL (ref 0.40–1.50)
GFR: 68.26 mL/min (ref >60.00)
Glucose, Bld: 122 mg/dL — ABNORMAL HIGH (ref 70–99)
Potassium: 3.9 mEq/L (ref 3.5–5.1)
Sodium: 137 mEq/L (ref 135–145)
Total Bilirubin: 1.2 mg/dL (ref 0.2–1.2)
Total Protein: 7.1 g/dL (ref 6.0–8.3)

## 2022-02-06 LAB — LDL CHOLESTEROL, DIRECT: Direct LDL: 149 mg/dL

## 2022-02-06 MED ORDER — METOPROLOL SUCCINATE ER 25 MG PO TB24: 25.0000 mg | ORAL_TABLET | Freq: Every day | ORAL | 3 refills | Status: DC

## 2022-02-06 MED ORDER — ATORVASTATIN CALCIUM 40 MG PO TABS: 40.0000 mg | ORAL_TABLET | Freq: Every day | ORAL | 3 refills | Status: DC

## 2022-02-06 MED ORDER — SERTRALINE HCL 100 MG PO TABS: 100.0000 mg | ORAL_TABLET | Freq: Every day | ORAL | 3 refills | Status: DC

## 2022-02-06 MED ORDER — LOSARTAN POTASSIUM 100 MG PO TABS: 100.0000 mg | ORAL_TABLET | Freq: Every day | ORAL | 3 refills | Status: DC

## 2022-02-06 NOTE — Assessment & Plan Note (Signed)
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

## 2022-02-06 NOTE — Assessment & Plan Note (Signed)
BP Readings from Last 3 Encounters:  ?02/06/22 130/80  ?06/15/21 131/77  ?06/05/21 140/80  ? ?Good control ?Is on metoprolol 25mg  daily ?Will change the losartan to 100mg  daily ?
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
## 2022-02-06 NOTE — Patient Instructions (Signed)
DASH Eating Plan °DASH stands for Dietary Approaches to Stop Hypertension. The DASH eating plan is a healthy eating plan that has been shown to: °Reduce high blood pressure (hypertension). °Reduce your risk for type 2 diabetes, heart disease, and stroke. °Help with weight loss. °What are tips for following this plan? °Reading food labels °Check food labels for the amount of salt (sodium) per serving. Choose foods with less than 5 percent of the Daily Value of sodium. Generally, foods with less than 300 milligrams (mg) of sodium per serving fit into this eating plan. °To find whole grains, look for the word "whole" as the first word in the ingredient list. °Shopping °Buy products labeled as "low-sodium" or "no salt added." °Buy fresh foods. Avoid canned foods and pre-made or frozen meals. °Cooking °Avoid adding salt when cooking. Use salt-free seasonings or herbs instead of table salt or sea salt. Check with your health care provider or pharmacist before using salt substitutes. °Do not fry foods. Cook foods using healthy methods such as baking, boiling, grilling, roasting, and broiling instead. °Cook with heart-healthy oils, such as olive, canola, avocado, soybean, or sunflower oil. °Meal planning ° °Eat a balanced diet that includes: °4 or more servings of fruits and 4 or more servings of vegetables each day. Try to fill one-half of your plate with fruits and vegetables. °6-8 servings of whole grains each day. °Less than 6 oz (170 g) of lean meat, poultry, or fish each day. A 3-oz (85-g) serving of meat is about the same size as a deck of cards. One egg equals 1 oz (28 g). °2-3 servings of low-fat dairy each day. One serving is 1 cup (237 mL). °1 serving of nuts, seeds, or beans 5 times each week. °2-3 servings of heart-healthy fats. Healthy fats called omega-3 fatty acids are found in foods such as walnuts, flaxseeds, fortified milks, and eggs. These fats are also found in cold-water fish, such as sardines, salmon,  and mackerel. °Limit how much you eat of: °Canned or prepackaged foods. °Food that is high in trans fat, such as some fried foods. °Food that is high in saturated fat, such as fatty meat. °Desserts and other sweets, sugary drinks, and other foods with added sugar. °Full-fat dairy products. °Do not salt foods before eating. °Do not eat more than 4 egg yolks a week. °Try to eat at least 2 vegetarian meals a week. °Eat more home-cooked food and less restaurant, buffet, and fast food. °Lifestyle °When eating at a restaurant, ask that your food be prepared with less salt or no salt, if possible. °If you drink alcohol: °Limit how much you use to: °0-1 drink a day for women who are not pregnant. °0-2 drinks a day for men. °Be aware of how much alcohol is in your drink. In the [place] one drink equals one 12 oz bottle of beer (355 mL), one 5 oz glass of wine (148 mL), or one 1½ oz glass of hard liquor (44 mL). °General information °Avoid eating more than 2,300 mg of salt a day. If you have hypertension, you may need to reduce your sodium intake to 1,500 mg a day. °Work with your health care provider to maintain a healthy body weight or to lose weight. Ask what an ideal weight is for you. °Get at least 30 minutes of exercise that causes your heart to beat faster (aerobic exercise) most days of the week. Activities may include walking, swimming, or biking. °Work with your health care provider or dietitian to   adjust your eating plan to your individual calorie needs. °What foods should I eat? °Fruits °All fresh, dried, or frozen fruit. Canned fruit in natural juice (without added sugar). °Vegetables °Fresh or frozen vegetables (raw, steamed, roasted, or grilled). Low-sodium or reduced-sodium tomato and vegetable juice. Low-sodium or reduced-sodium tomato sauce and tomato paste. Low-sodium or reduced-sodium canned vegetables. °Grains °Whole-grain or whole-wheat bread. Whole-grain or whole-wheat pasta. Brown rice. Oatmeal. Quinoa.  Bulgur. Whole-grain and low-sodium cereals. Pita bread. Low-fat, low-sodium crackers. Whole-wheat flour tortillas. °Meats and other proteins °Skinless chicken or turkey. Ground chicken or turkey. Pork with fat trimmed off. Fish and seafood. Egg whites. Dried beans, peas, or lentils. Unsalted nuts, nut butters, and seeds. Unsalted canned beans. Lean cuts of beef with fat trimmed off. Low-sodium, lean precooked or cured meat, such as sausages or meat loaves. °Dairy °Low-fat (1%) or fat-free (skim) milk. Reduced-fat, low-fat, or fat-free cheeses. Nonfat, low-sodium ricotta or cottage cheese. Low-fat or nonfat yogurt. Low-fat, low-sodium cheese. °Fats and oils °Soft margarine without trans fats. Vegetable oil. Reduced-fat, low-fat, or light mayonnaise and salad dressings (reduced-sodium). Canola, safflower, olive, avocado, soybean, and sunflower oils. Avocado. °Seasonings and condiments °Herbs. Spices. Seasoning mixes without salt. °Other foods °Unsalted popcorn and pretzels. Fat-free sweets. °The items listed above may not be a complete list of foods and beverages you can eat. Contact a dietitian for more information. °What foods should I avoid? °Fruits °Canned fruit in a light or heavy syrup. Fried fruit. Fruit in cream or butter sauce. °Vegetables °Creamed or fried vegetables. Vegetables in a cheese sauce. Regular canned vegetables (not low-sodium or reduced-sodium). Regular canned tomato sauce and paste (not low-sodium or reduced-sodium). Regular tomato and vegetable juice (not low-sodium or reduced-sodium). Pickles. Olives. °Grains °Baked goods made with fat, such as croissants, muffins, or some breads. Dry pasta or rice meal packs. °Meats and other proteins °Fatty cuts of meat. Ribs. Fried meat. Bacon. Bologna, salami, and other precooked or cured meats, such as sausages or meat loaves. Fat from the back of a pig (fatback). Bratwurst. Salted nuts and seeds. Canned beans with added salt. Canned or smoked fish.  Whole eggs or egg yolks. Chicken or turkey with skin. °Dairy °Whole or 2% milk, cream, and half-and-half. Whole or full-fat cream cheese. Whole-fat or sweetened yogurt. Full-fat cheese. Nondairy creamers. Whipped toppings. Processed cheese and cheese spreads. °Fats and oils °Butter. Stick margarine. Lard. Shortening. Ghee. Bacon fat. Tropical oils, such as coconut, palm kernel, or palm oil. °Seasonings and condiments °Onion salt, garlic salt, seasoned salt, table salt, and sea salt. Worcestershire sauce. Tartar sauce. Barbecue sauce. Teriyaki sauce. Soy sauce, including reduced-sodium. Steak sauce. Canned and packaged gravies. Fish sauce. Oyster sauce. Cocktail sauce. Store-bought horseradish. Ketchup. Mustard. Meat flavorings and tenderizers. Bouillon cubes. Hot sauces. Pre-made or packaged marinades. Pre-made or packaged taco seasonings. Relishes. Regular salad dressings. °Other foods °Salted popcorn and pretzels. °The items listed above may not be a complete list of foods and beverages you should avoid. Contact a dietitian for more information. °Where to find more information °National Heart, Lung, and Blood Institute: www.nhlbi.nih.gov °American Heart Association: www.heart.org °Academy of Nutrition and Dietetics: www.eatright.org °National Kidney Foundation: www.kidney.org °Summary °The DASH eating plan is a healthy eating plan that has been shown to reduce high blood pressure (hypertension). It may also reduce your risk for type 2 diabetes, heart disease, and stroke. °When on the DASH eating plan, aim to eat more fresh fruits and vegetables, whole grains, lean proteins, low-fat dairy, and heart-healthy fats. °With the DASH   eating plan, you should limit salt (sodium) intake to 2,300 mg a day. If you have hypertension, you may need to reduce your sodium intake to 1,500 mg a day. °Work with your health care provider or dietitian to adjust your eating plan to your individual calorie needs. °This information is not  intended to replace advice given to you by your health care provider. Make sure you discuss any questions you have with your health care provider. °Document Revised: 10/27/2019 Document Reviewed: 10/27/2019 °Elsevier Patient Education © 2022 Elsevier Inc. ° °

## 2022-02-06 NOTE — Assessment & Plan Note (Signed)
Mostly anxiety which is controlled with sertraline 100mg  daily ?

## 2022-03-31 ENCOUNTER — Emergency Department (HOSPITAL_COMMUNITY)
Admission: EM | Admit: 2022-03-31 | Discharge: 2022-04-01 | Disposition: A | Discharge status: Home / Self Care | Payer: BC Managed Care – PPO | Attending: Emergency Medicine | Admitting: Emergency Medicine

## 2022-03-31 ENCOUNTER — Emergency Department (HOSPITAL_COMMUNITY): Payer: BC Managed Care – PPO

## 2022-03-31 ENCOUNTER — Other Ambulatory Visit: Payer: Self-pay

## 2022-03-31 DIAGNOSIS — S62646A Nondisplaced fracture of proximal phalanx of right little finger, initial encounter for closed fracture: Secondary | ICD-10-CM | POA: Diagnosis not present

## 2022-03-31 DIAGNOSIS — S62644A Nondisplaced fracture of proximal phalanx of right ring finger, initial encounter for closed fracture: Secondary | ICD-10-CM | POA: Diagnosis not present

## 2022-03-31 DIAGNOSIS — Z79899 Other long term (current) drug therapy: Secondary | ICD-10-CM | POA: Diagnosis not present

## 2022-03-31 DIAGNOSIS — S62356A Nondisplaced fracture of shaft of fifth metacarpal bone, right hand, initial encounter for closed fracture: Secondary | ICD-10-CM | POA: Diagnosis not present

## 2022-03-31 DIAGNOSIS — Z7982 Long term (current) use of aspirin: Secondary | ICD-10-CM | POA: Diagnosis not present

## 2022-03-31 DIAGNOSIS — I10 Essential (primary) hypertension: Secondary | ICD-10-CM | POA: Insufficient documentation

## 2022-03-31 DIAGNOSIS — T65893A Toxic effect of other specified substances, assault, initial encounter: Secondary | ICD-10-CM | POA: Insufficient documentation

## 2022-03-31 DIAGNOSIS — S6991XA Unspecified injury of right wrist, hand and finger(s), initial encounter: Secondary | ICD-10-CM | POA: Diagnosis present

## 2022-03-31 MED ORDER — IBUPROFEN 400 MG PO TABS
600.0000 mg | ORAL_TABLET | Freq: Once | ORAL | Status: AC
  Administered 2022-03-31: 600 mg via ORAL
  Filled 2022-03-31: qty 1

## 2022-03-31 MED ORDER — OXYCODONE-ACETAMINOPHEN 5-325 MG PO TABS
1.0000 | ORAL_TABLET | Freq: Once | ORAL | Status: AC
  Administered 2022-03-31: 1 via ORAL
  Filled 2022-03-31: qty 1
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
## 2022-03-31 NOTE — Progress Notes (Signed)
Orthopedic Tech Progress Note ?Patient Details:  ?Harold Robinson ?[birth year] ?[id number] ? ?Ortho Devices ?Type of Ortho Device: Shoulder immobilizer, Ulna gutter splint ?Ortho Device/Splint Location: rue ?Ortho Device/Splint Interventions: Ordered, Application, Adjustment ?  ?Post Interventions ?Patient Tolerated: Well ? ?Edwina Barth ?03/31/2022, 11:55 PM ? ?
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
## 2022-03-31 NOTE — ED Notes (Signed)
Ortho tech called 
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
## 2022-03-31 NOTE — Discharge Instructions (Addendum)
Please take Ibuprofen (Advil, motrin) and Tylenol (acetaminophen) to relieve your pain.   ? ?You may take up to 600 MG (3 pills) of normal strength ibuprofen every 8 hours as needed.   ?You make take tylenol, up to 1,000 mg (two extra strength pills) every 8 hours as needed.  ? ?It is safe to take ibuprofen and tylenol at the same time as they work differently.  ? Do not take more than 3,000 mg tylenol in a 24 hour period (not more than one dose every 8 hours.  Please check all medication labels as many medications such as pain and cold medications may contain tylenol.  Do not drink alcohol while taking these medications.  Do not take other NSAID'S while taking ibuprofen (such as aleve or naproxen).  Please take ibuprofen with food to decrease stomach upset. ? ?Today you received medications that may make you sleepy or impair your ability to make decisions.  For the next 24 hours please do not drive, operate heavy machinery, care for a small child with out another adult present, or perform any activities that may cause harm to you or someone else if you were to fall asleep or be impaired.  ? ?You are being prescribed a medication which may make you sleepy. Please follow up of listed precautions for at least 24 hours after taking one dose. ? ?While in the ED your blood pressure was high.  Please follow up with your primary care doctor or the wellness clinic for repeat evaluation as you may need medication.  High blood pressure can cause long term, potentially serious, damage if left untreated.  ? ?

## 2022-04-01 MED ORDER — OXYCODONE-ACETAMINOPHEN 5-325 MG PO TABS: 1.0000 | ORAL_TABLET | Freq: Four times a day (QID) | ORAL | 0 refills | Status: DC | PRN

## 2022-04-02 ENCOUNTER — Other Ambulatory Visit: Payer: Self-pay | Admitting: Physician Assistant

## 2022-04-02 ENCOUNTER — Encounter: Payer: Self-pay | Admitting: Physician Assistant

## 2022-04-02 ENCOUNTER — Ambulatory Visit (INDEPENDENT_AMBULATORY_CARE_PROVIDER_SITE_OTHER): Payer: BC Managed Care – PPO | Admitting: Physician Assistant

## 2022-04-02 DIAGNOSIS — S6291XD Unspecified fracture of right wrist and hand, subsequent encounter for fracture with routine healing: Secondary | ICD-10-CM | POA: Diagnosis not present

## 2022-04-02 DIAGNOSIS — S62306A Unspecified fracture of fifth metacarpal bone, right hand, initial encounter for closed fracture: Secondary | ICD-10-CM | POA: Diagnosis not present

## 2022-04-02 MED ORDER — OXYCODONE-ACETAMINOPHEN 5-325 MG PO TABS: 1.0000 | ORAL_TABLET | Freq: Four times a day (QID) | ORAL | 0 refills | Status: DC | PRN

## 2022-04-03 ENCOUNTER — Telehealth: Payer: Self-pay

## 2022-04-03 ENCOUNTER — Encounter: Payer: Self-pay | Admitting: Physician Assistant

## 2022-04-03 NOTE — Telephone Encounter (Signed)
Artis Delay called and states this pt is s/p surgery for hand fx and requires prior auth ofr pain medication. Entered info into cover my meds [employer] prior aut. Awaiting respone will notify pt.  ?
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
## 2022-04-03 NOTE — Telephone Encounter (Signed)
FYI ?Pt's insurance denied pain medication. Tried calling pt to advise and discuss but no answer and no way to leave a voice mail ?

## 2022-04-06 NOTE — Telephone Encounter (Signed)
I checked the cover my meds portal this morning and this pt's pain medication has been approved. You may want to call and see if he paid out of pocket for a few days supply at the pharmacy. If he did then he can run the rest through his insurance.  ?

## 2022-04-06 NOTE — Telephone Encounter (Signed)
Called pharmacy, pt picked up using a discount card. Hopefully next one will be good.  ?

## 2022-04-08 ENCOUNTER — Ambulatory Visit (INDEPENDENT_AMBULATORY_CARE_PROVIDER_SITE_OTHER): Payer: BC Managed Care – PPO | Admitting: Orthopaedic Surgery

## 2022-04-08 ENCOUNTER — Encounter: Payer: Self-pay | Admitting: Orthopaedic Surgery

## 2022-04-08 ENCOUNTER — Ambulatory Visit (INDEPENDENT_AMBULATORY_CARE_PROVIDER_SITE_OTHER): Payer: BC Managed Care – PPO

## 2022-04-08 DIAGNOSIS — S6291XD Unspecified fracture of right wrist and hand, subsequent encounter for fracture with routine healing: Secondary | ICD-10-CM

## 2022-04-08 DIAGNOSIS — M25511 Pain in right shoulder: Secondary | ICD-10-CM

## 2022-04-08 MED ORDER — LIDOCAINE HCL 1 % IJ SOLN
3.0000 mL | INTRAMUSCULAR | Status: AC | PRN
  Administered 2022-04-08: 3 mL

## 2022-04-08 MED ORDER — METHYLPREDNISOLONE ACETATE 40 MG/ML IJ SUSP
40.0000 mg | INTRAMUSCULAR | Status: AC | PRN
  Administered 2022-04-08: 40 mg via INTRA_ARTICULAR
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
## 2022-04-11 ENCOUNTER — Other Ambulatory Visit: Payer: Self-pay | Admitting: Orthopaedic Surgery

## 2022-04-11 MED ORDER — OXYCODONE-ACETAMINOPHEN 5-325 MG PO TABS
1.0000 | ORAL_TABLET | Freq: Four times a day (QID) | ORAL | 0 refills | Status: DC | PRN
Start: 2022-04-11 — End: 2022-06-19

## 2022-04-29 ENCOUNTER — Ambulatory Visit (INDEPENDENT_AMBULATORY_CARE_PROVIDER_SITE_OTHER): Payer: BC Managed Care – PPO

## 2022-04-29 ENCOUNTER — Encounter: Payer: Self-pay | Admitting: Orthopaedic Surgery

## 2022-04-29 ENCOUNTER — Other Ambulatory Visit: Payer: Self-pay

## 2022-04-29 ENCOUNTER — Ambulatory Visit: Payer: BC Managed Care – PPO | Admitting: Orthopaedic Surgery

## 2022-04-29 DIAGNOSIS — S6291XD Unspecified fracture of right wrist and hand, subsequent encounter for fracture with routine healing: Secondary | ICD-10-CM

## 2022-04-29 DIAGNOSIS — M25511 Pain in right shoulder: Secondary | ICD-10-CM
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
## 2022-05-19 ENCOUNTER — Other Ambulatory Visit: Payer: Self-pay | Admitting: Orthopaedic Surgery

## 2022-05-19 ENCOUNTER — Ambulatory Visit
Admission: RE | Admit: 2022-05-19 | Discharge: 2022-05-19 | Disposition: A | Discharge status: Home / Self Care | Payer: BC Managed Care – PPO | Source: Ambulatory Visit | Attending: Orthopaedic Surgery | Admitting: Orthopaedic Surgery

## 2022-05-19 DIAGNOSIS — M25511 Pain in right shoulder: Secondary | ICD-10-CM

## 2022-05-19 DIAGNOSIS — Z77018 Contact with and (suspected) exposure to other hazardous metals: Secondary | ICD-10-CM

## 2022-05-21 ENCOUNTER — Other Ambulatory Visit: Payer: Self-pay | Admitting: Internal Medicine

## 2022-05-25 ENCOUNTER — Telehealth: Payer: Self-pay | Admitting: Physician Assistant

## 2022-05-25 DIAGNOSIS — M25511 Pain in right shoulder: Secondary | ICD-10-CM

## 2022-05-25 NOTE — Telephone Encounter (Signed)
Please send him for an intra-articular injection of his right shoulder.  Also having follow-up with Dr. Ninfa Linden next week for reevaluation of his hand.

## 2022-05-25 NOTE — Telephone Encounter (Signed)
Referral placed in chart and pt has an appt with Dr. Ninfa Linden on monday

## 2022-05-25 NOTE — Addendum Note (Signed)
Addended by: Robyne Peers on: 05/25/2022 03:01 PM   Modules accepted: Orders

## 2022-06-01 ENCOUNTER — Other Ambulatory Visit: Payer: Self-pay

## 2022-06-01 ENCOUNTER — Ambulatory Visit (INDEPENDENT_AMBULATORY_CARE_PROVIDER_SITE_OTHER): Payer: BC Managed Care – PPO | Admitting: Orthopaedic Surgery

## 2022-06-01 ENCOUNTER — Encounter: Payer: Self-pay | Admitting: Orthopaedic Surgery

## 2022-06-01 DIAGNOSIS — M25511 Pain in right shoulder: Secondary | ICD-10-CM

## 2022-06-01 DIAGNOSIS — G8929 Other chronic pain: Secondary | ICD-10-CM

## 2022-06-01 DIAGNOSIS — S6291XD Unspecified fracture of right wrist and hand, subsequent encounter for fracture with routine healing: Secondary | ICD-10-CM

## 2022-06-03 IMAGING — DX DG HAND COMPLETE 3+V*R*
3 series · 3 of 3 positions shown · non-contrast
Comparison: None.

CLINICAL DATA: Pain/injury

EXAM:
RIGHT HAND - COMPLETE 3+ VIEW

[hand pa]
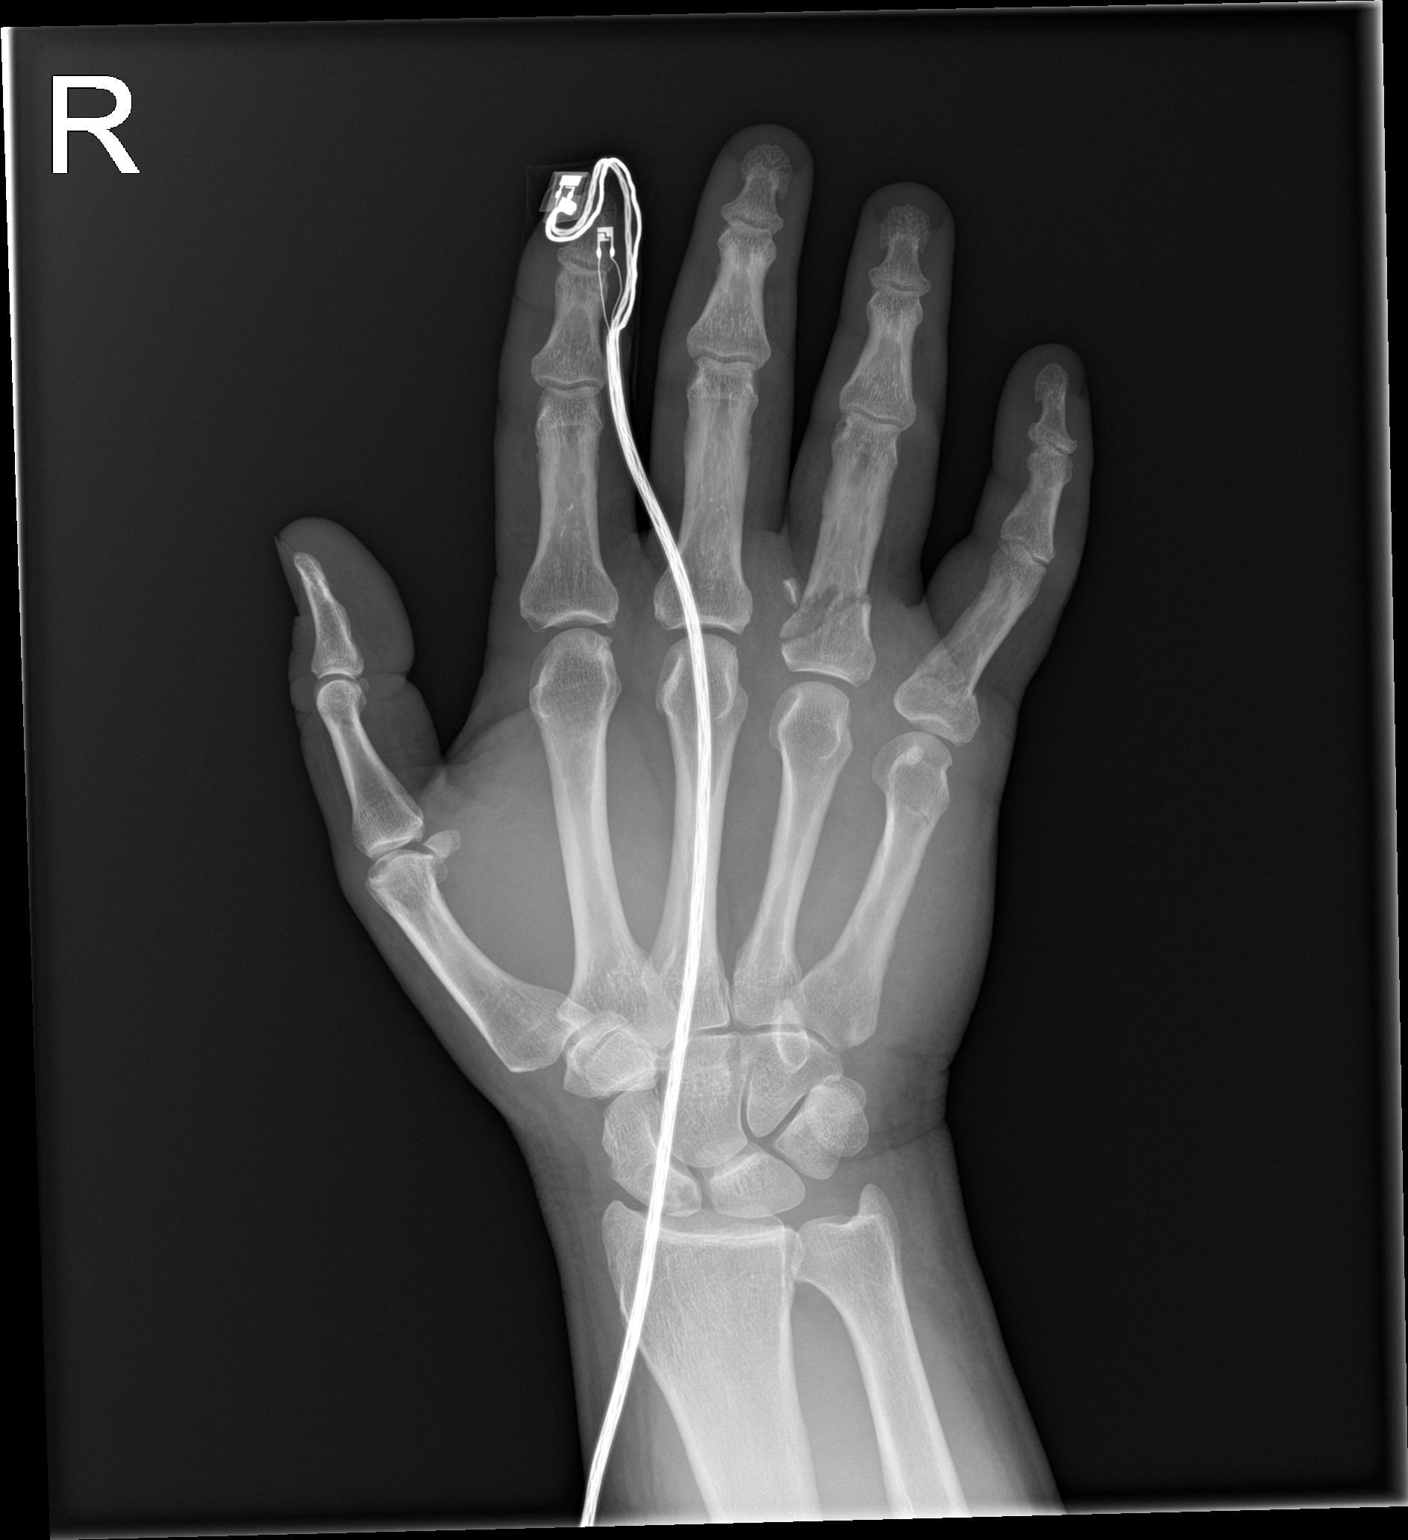

[hand obl]
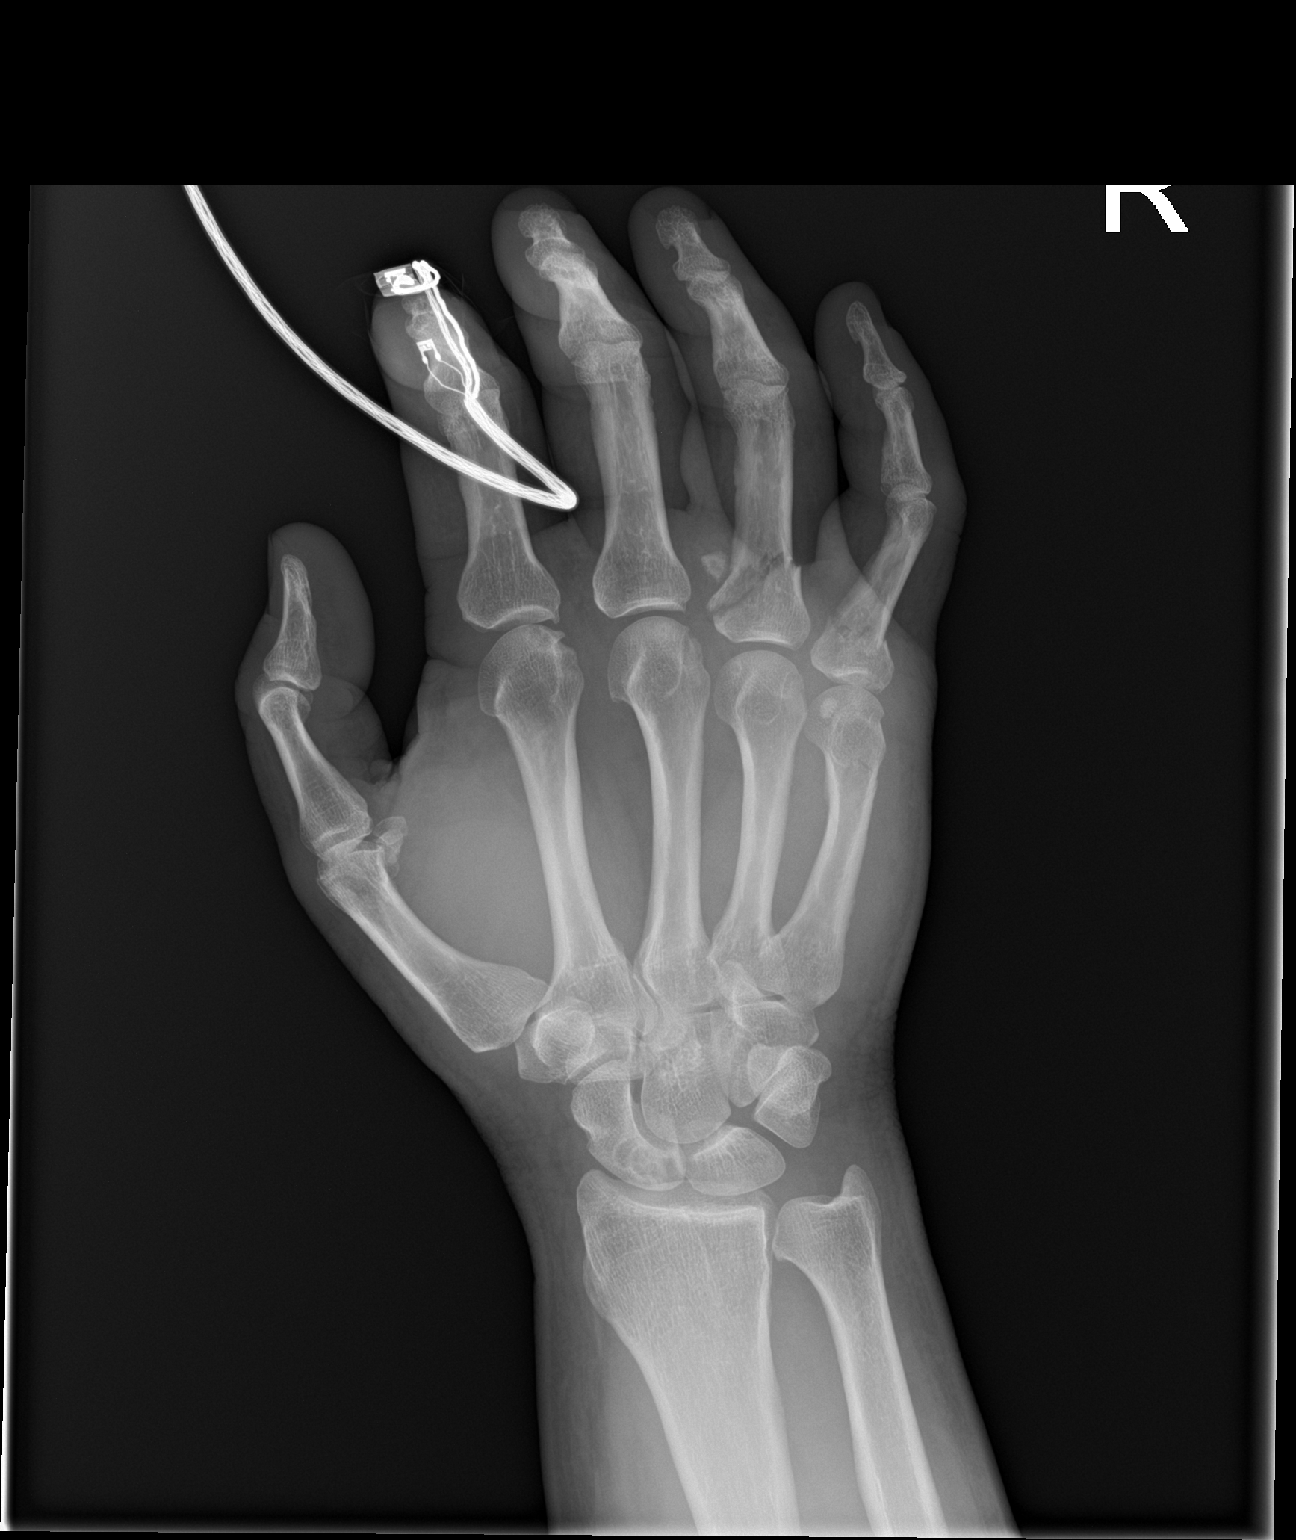

[hand lat]
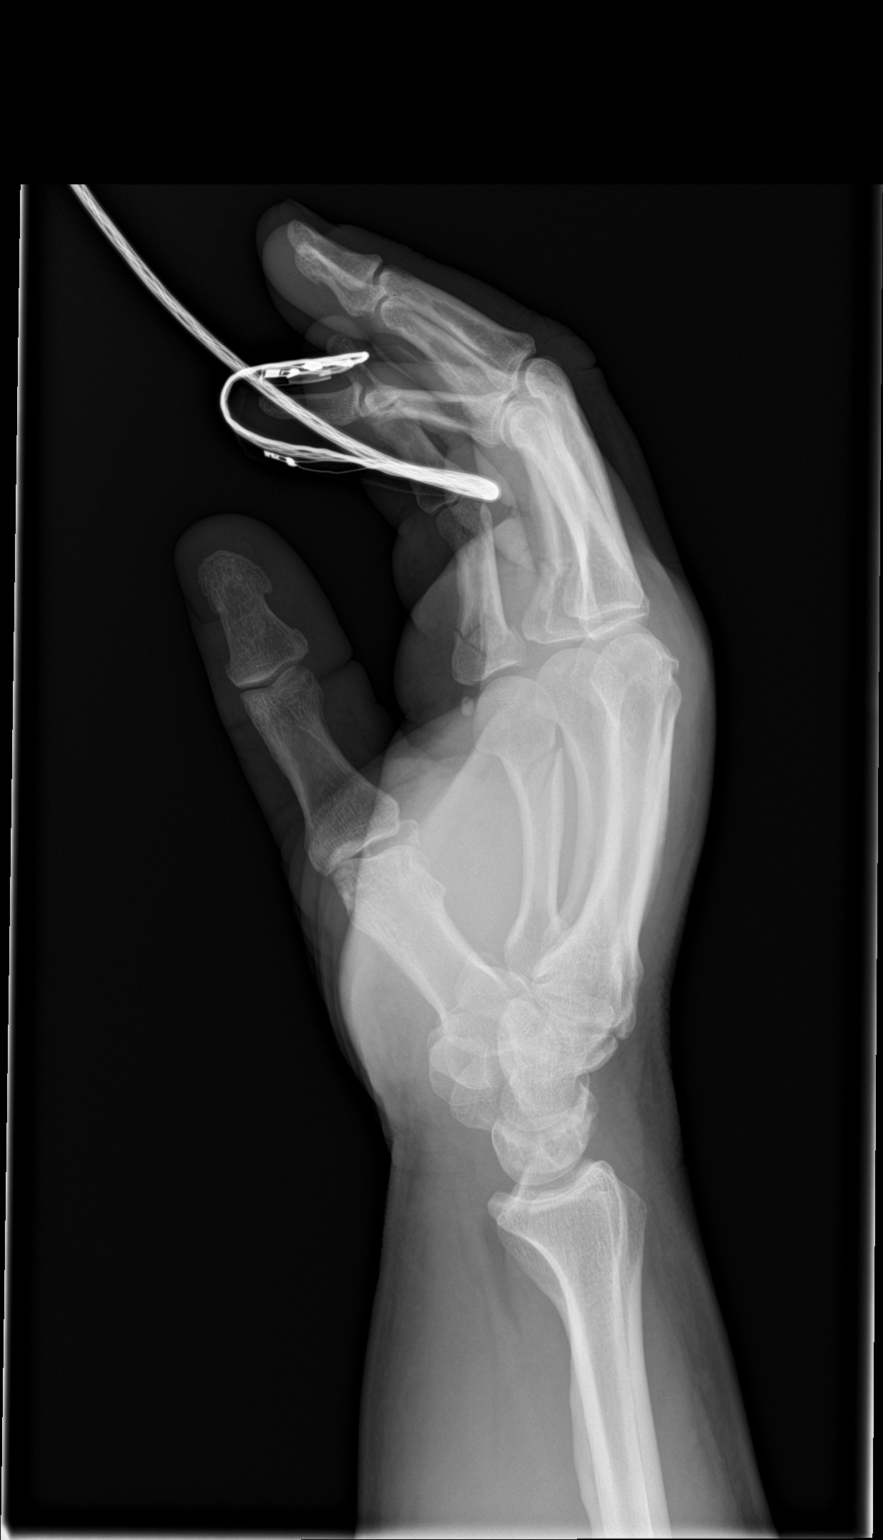

[3 of 3 positions shown; findings below may reference images not displayed]

FINDINGS: Comminuted fracture involving the base of the 4th proximal phalanx,
without intra-articular extension.

Mildly comminuted transverse fracture involving the proximal shaft
of the 5th proximal phalanx, without intra-articular extension.

Nondisplaced 5th distal metacarpal shaft fracture, best visualized
on the lateral view.

Mild dorsal soft tissue swelling.
IMPRESSION: Fractures involving the distal 5th metacarpal and proximal aspect of
the 4th and 5th proximal phalanges, as above.

## 2022-06-05 ENCOUNTER — Ambulatory Visit
Admission: RE | Admit: 2022-06-05 | Discharge: 2022-06-05 | Disposition: A | Discharge status: Home / Self Care | Payer: BC Managed Care – PPO | Source: Ambulatory Visit | Attending: Orthopaedic Surgery | Admitting: Orthopaedic Surgery

## 2022-06-05 DIAGNOSIS — G8929 Other chronic pain: Secondary | ICD-10-CM

## 2022-06-05 MED ORDER — METHYLPREDNISOLONE ACETATE 40 MG/ML INJ SUSP (RADIOLOG
80.0000 mg | Freq: Once | INTRAMUSCULAR | Status: AC
  Administered 2022-06-05: 80 mg via INTRA_ARTICULAR

## 2022-06-05 MED ORDER — IOPAMIDOL (ISOVUE-M 200) INJECTION 41%
1.0000 mL | Freq: Once | INTRAMUSCULAR | Status: AC
Start: 2022-06-05 — End: 2022-06-05
  Administered 2022-06-05: 1 mL via INTRA_ARTICULAR

## 2022-06-15 ENCOUNTER — Telehealth: Payer: Self-pay

## 2022-06-15 NOTE — Telephone Encounter (Signed)
Can you change his PT to OT or do I need to make another?

## 2022-06-17 ENCOUNTER — Other Ambulatory Visit: Payer: Self-pay

## 2022-06-17 ENCOUNTER — Encounter: Payer: Self-pay | Admitting: Orthopaedic Surgery

## 2022-06-17 ENCOUNTER — Ambulatory Visit (INDEPENDENT_AMBULATORY_CARE_PROVIDER_SITE_OTHER): Payer: BC Managed Care – PPO

## 2022-06-17 ENCOUNTER — Ambulatory Visit: Payer: BC Managed Care – PPO | Admitting: Orthopaedic Surgery

## 2022-06-17 DIAGNOSIS — S6291XD Unspecified fracture of right wrist and hand, subsequent encounter for fracture with routine healing: Secondary | ICD-10-CM

## 2022-06-17 DIAGNOSIS — G8929 Other chronic pain: Secondary | ICD-10-CM
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
## 2022-06-18 ENCOUNTER — Ambulatory Visit: Payer: BC Managed Care – PPO | Admitting: Rehabilitative and Restorative Service Providers"

## 2022-06-19 ENCOUNTER — Encounter: Payer: Self-pay | Admitting: Internal Medicine

## 2022-06-19 ENCOUNTER — Ambulatory Visit (INDEPENDENT_AMBULATORY_CARE_PROVIDER_SITE_OTHER): Payer: BC Managed Care – PPO | Admitting: Internal Medicine

## 2022-06-19 VITALS — BP 136/84 | HR 83 | Temp 97.9°F | Ht 71.0 in | Wt 245.0 lb

## 2022-06-19 DIAGNOSIS — I2510 Atherosclerotic heart disease of native coronary artery without angina pectoris: Secondary | ICD-10-CM | POA: Diagnosis not present

## 2022-06-19 DIAGNOSIS — R152 Fecal urgency: Secondary | ICD-10-CM

## 2022-06-19 DIAGNOSIS — I10 Essential (primary) hypertension: Secondary | ICD-10-CM | POA: Diagnosis not present

## 2022-06-19 DIAGNOSIS — F39 Unspecified mood [affective] disorder: Secondary | ICD-10-CM | POA: Diagnosis not present

## 2022-06-19 DIAGNOSIS — R159 Full incontinence of feces: Secondary | ICD-10-CM | POA: Diagnosis not present

## 2022-06-19 LAB — RENAL FUNCTION PANEL
Albumin: 4.2 g/dL (ref 3.5–5.2)
BUN: 15 mg/dL (ref 6–23)
CO2: 28 mEq/L (ref 19–32)
Calcium: 8.7 mg/dL (ref 8.4–10.5)
Chloride: 105 mEq/L (ref 96–112)
Creatinine, Ser: 1.16 mg/dL (ref 0.40–1.50)
GFR: 73.76 mL/min (ref >60.00)
Glucose, Bld: 120 mg/dL — ABNORMAL HIGH (ref 70–99)
Phosphorus: 2.3 mg/dL (ref 2.3–4.6)
Potassium: 3.8 mEq/L (ref 3.5–5.1)
Sodium: 139 mEq/L (ref 135–145)

## 2022-06-19 NOTE — Assessment & Plan Note (Signed)
Since cholecystectomy Will have him try fiber supplements If ineffective, will try cholestyramine
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
## 2022-06-19 NOTE — Patient Instructions (Signed)
Try a daily fiber supplement--like fiber con, metamucil or citrucel. It that isn't enough to control your bowels, I will send a prescription medication.

## 2022-06-19 NOTE — Assessment & Plan Note (Signed)
No symptoms On atorvastatin 40 and ASA 81 every other day
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
## 2022-06-19 NOTE — Assessment & Plan Note (Signed)
BP Readings from Last 3 Encounters:  06/19/22 136/84  03/31/22 (!) 167/85  02/06/22 130/80   Better on the losartan up to '100mg'$  Metoprolol 25 daily

## 2022-06-29 ENCOUNTER — Encounter: Payer: Self-pay | Admitting: Physical Medicine and Rehabilitation

## 2022-06-29 ENCOUNTER — Ambulatory Visit (INDEPENDENT_AMBULATORY_CARE_PROVIDER_SITE_OTHER): Payer: BC Managed Care – PPO | Admitting: Physical Medicine and Rehabilitation

## 2022-06-29 ENCOUNTER — Ambulatory Visit: Payer: Self-pay

## 2022-06-29 DIAGNOSIS — G8929 Other chronic pain: Secondary | ICD-10-CM | POA: Diagnosis not present

## 2022-06-29 DIAGNOSIS — M25511 Pain in right shoulder: Secondary | ICD-10-CM

## 2022-06-29 MED ORDER — TRIAMCINOLONE ACETONIDE 40 MG/ML IJ SUSP
40.0000 mg | INTRAMUSCULAR | Status: AC | PRN
  Administered 2022-06-29: 40 mg via INTRA_ARTICULAR

## 2022-06-29 MED ORDER — BUPIVACAINE HCL 0.25 % IJ SOLN
5.0000 mL | INTRAMUSCULAR | Status: AC | PRN
  Administered 2022-06-29: 5 mL via INTRA_ARTICULAR

## 2022-06-29 NOTE — Progress Notes (Signed)
Pt state right shoulder pain. Pt state any movement with his right arm makes the pain worse. Pt state he take over the counter pain meds to help ease his pain.  Numeric Pain Rating Scale and Functional Assessment Average Pain 8   In the last MONTH (on 0-10 scale) has pain interfered with the following?  1. General activity like being  able to carry out your everyday physical activities such as walking, climbing stairs, carrying groceries, or moving a chair?  Rating(10)   -BT, -Dye Allergies.

## 2022-06-29 NOTE — Progress Notes (Signed)
   Harold Robinson - 51 y.o. male MRN [id number]  Date of birth: February 12, 1971  Office Visit Note: Visit Date: 06/29/2022 PCP: Venia Carbon, MD Referred by: Venia Carbon, MD  Subjective: Chief Complaint  Patient presents with   Right Shoulder - Pain   HPI:  Harold Robinson is a 51 y.o. male who comes in today at the request of Dr. Jean Rosenthal for planned Right anesthetic glenohumeral arthrogram with fluoroscopic guidance.  The patient has failed conservative care including home exercise, medications, time and activity modification.  This injection will be diagnostic and hopefully therapeutic.  Please see requesting physician notes for further details and justification.    ROS Otherwise per HPI.  Assessment & Plan: Visit Diagnoses:    ICD-10-CM   1. Chronic right shoulder pain  M25.511 Large Joint Inj: R glenohumeral   G89.29 XR C-ARM NO REPORT      Plan: No additional findings.   Meds & Orders: No orders of the defined types were placed in this encounter.   Orders Placed This Encounter  Procedures   Large Joint Inj: R glenohumeral   XR C-ARM NO REPORT    Follow-up: Return if symptoms worsen or fail to improve.   Procedures: Large Joint Inj: R glenohumeral on 06/29/2022 8:47 AM Indications: pain and diagnostic evaluation Details: 22 G 3.5 in needle, fluoroscopy-guided anteromedial approach  Arthrogram: No  Medications: 40 mg triamcinolone acetonide 40 MG/ML; 5 mL bupivacaine 0.25 % Outcome: tolerated well, no immediate complications  There was excellent flow of contrast producing a partial arthrogram of the glenohumeral joint. The patient did have relief of symptoms during the anesthetic phase of the injection. Procedure, treatment alternatives, risks and benefits explained, specific risks discussed. Consent was given by the patient. Immediately prior to procedure a time out was called to verify the correct patient, procedure, equipment, support staff and  site/side marked as required. Patient was prepped and draped in the usual sterile fashion.          Clinical History: No specialty comments available.     Objective:  VS:  HT:    WT:   BMI:     BP:   HR: bpm  TEMP: ( )  RESP:  Physical Exam   Imaging: XR C-ARM NO REPORT  Result Date: 06/29/2022 Please see Notes tab for imaging impression.

## 2022-07-08 ENCOUNTER — Telehealth: Payer: Self-pay | Admitting: Orthopaedic Surgery

## 2022-07-08 NOTE — Telephone Encounter (Signed)
Received medical records release form,$25.00 cash and FMLA paperwork from patient/ Forwarding to [facility] today

## 2022-07-20 ENCOUNTER — Encounter: Payer: Self-pay | Admitting: Occupational Therapy

## 2022-07-20 ENCOUNTER — Ambulatory Visit: Payer: BC Managed Care – PPO | Attending: Orthopaedic Surgery | Admitting: Occupational Therapy

## 2022-07-20 DIAGNOSIS — M6281 Muscle weakness (generalized): Secondary | ICD-10-CM

## 2022-07-20 DIAGNOSIS — G8929 Other chronic pain: Secondary | ICD-10-CM | POA: Insufficient documentation

## 2022-07-20 DIAGNOSIS — M25511 Pain in right shoulder: Secondary | ICD-10-CM | POA: Insufficient documentation

## 2022-07-20 DIAGNOSIS — S6291XD Unspecified fracture of right wrist and hand, subsequent encounter for fracture with routine healing: Secondary | ICD-10-CM | POA: Diagnosis not present

## 2022-07-20 DIAGNOSIS — M25641 Stiffness of right hand, not elsewhere classified: Secondary | ICD-10-CM | POA: Diagnosis not present

## 2022-07-20 DIAGNOSIS — M79641 Pain in right hand: Secondary | ICD-10-CM

## 2022-07-20 DIAGNOSIS — M25631 Stiffness of right wrist, not elsewhere classified: Secondary | ICD-10-CM
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
## 2022-07-22 ENCOUNTER — Telehealth: Payer: Self-pay

## 2022-07-22 IMAGING — CR DG ORBITS FOR FOREIGN BODY
2 series · 2 of 2 positions shown · non-contrast
Comparison: None Available.

CLINICAL DATA: Metal working/exposure; clearance prior to MRI

EXAM:
ORBITS FOR FOREIGN BODY - 2 VIEW

[w orbit pa (1 of 2)]
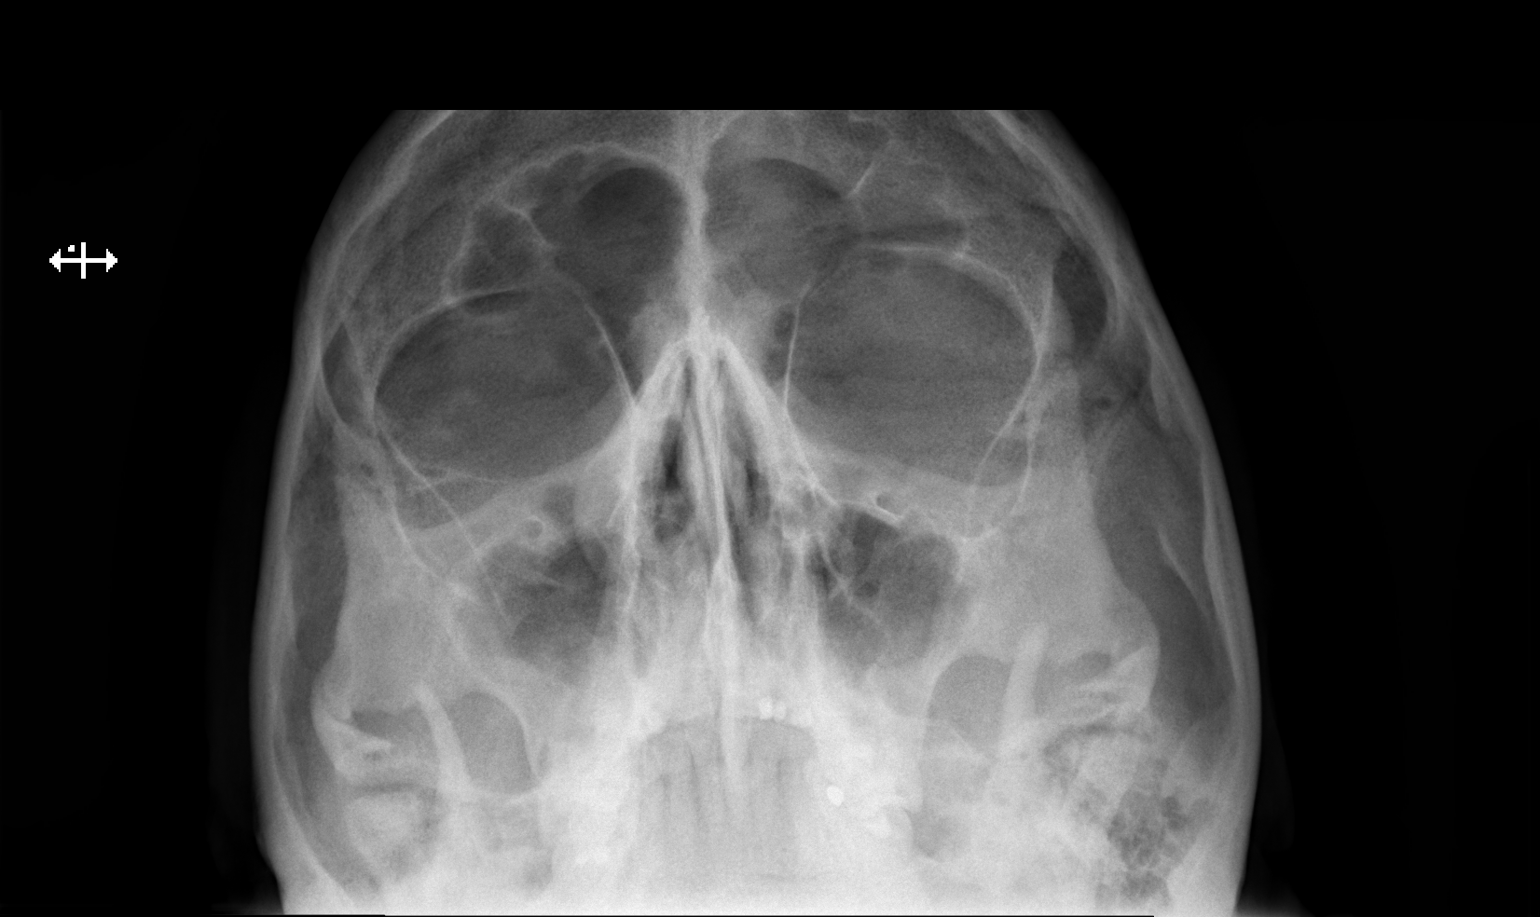

[w orbit pa (2 of 2)]
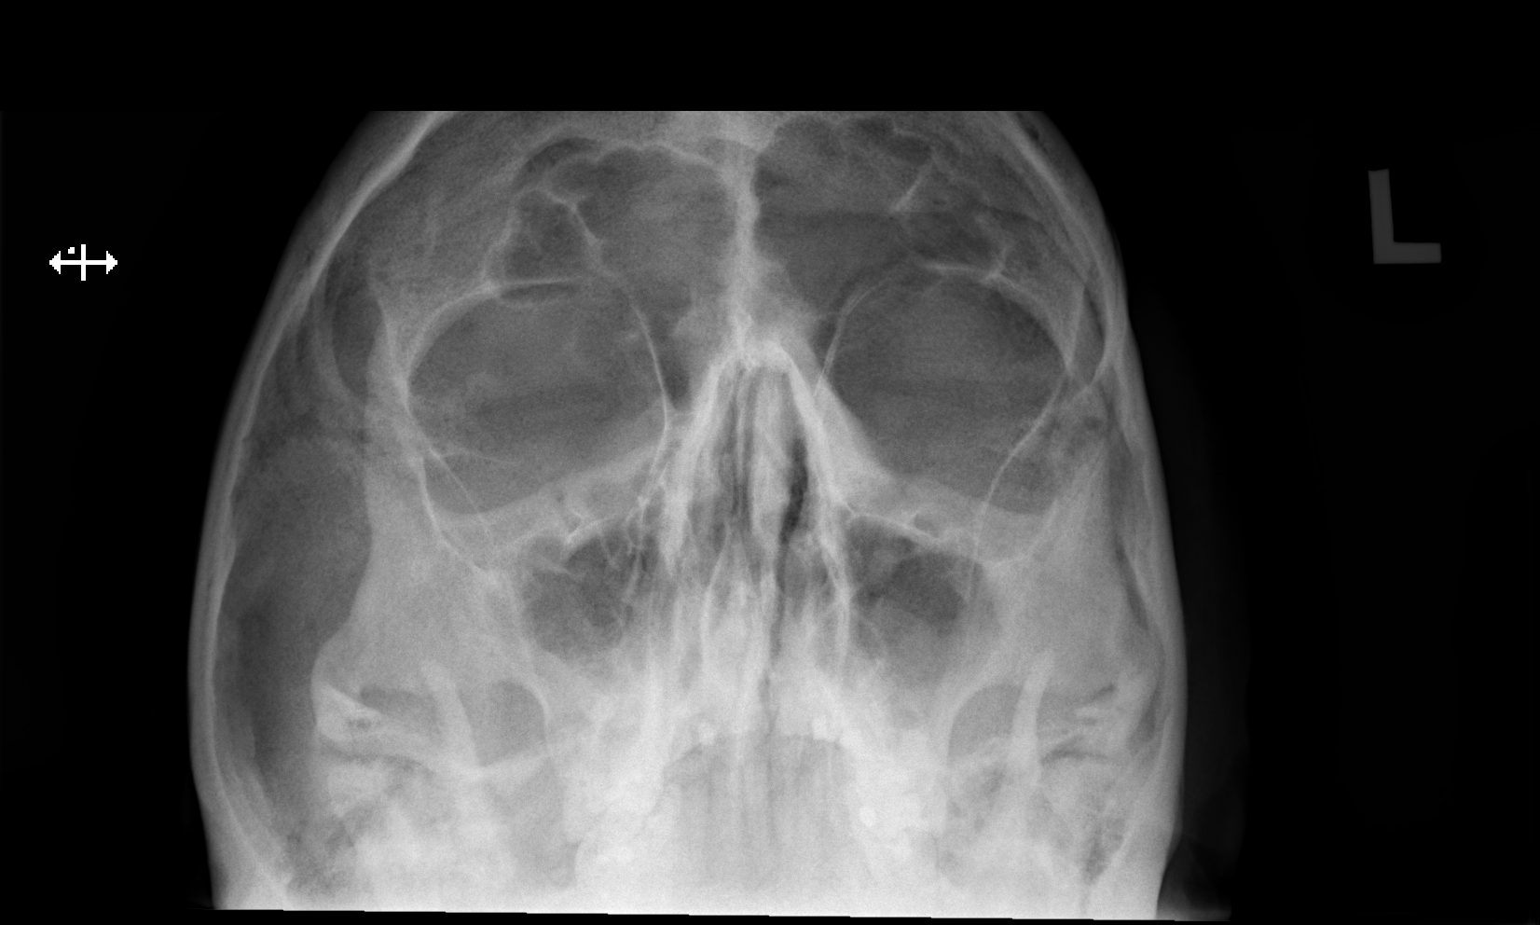

[2 of 2 positions shown; findings below may reference images not displayed]

FINDINGS: There is no evidence of metallic foreign body within the orbits. No
significant bone abnormality identified.
IMPRESSION: No evidence of metallic foreign body within the orbits.

## 2022-07-22 NOTE — Telephone Encounter (Signed)
Pt called triage and states that he needs to talk to someone about FMLA forms. Please call back [phone]

## 2022-07-22 NOTE — Telephone Encounter (Signed)
Message relayed to [facility]

## 2022-07-23 ENCOUNTER — Ambulatory Visit: Payer: BC Managed Care – PPO | Admitting: Physical Therapy

## 2022-07-23 ENCOUNTER — Encounter: Payer: Self-pay | Admitting: Physical Therapy

## 2022-07-23 ENCOUNTER — Ambulatory Visit: Payer: BC Managed Care – PPO | Admitting: Occupational Therapy

## 2022-07-23 DIAGNOSIS — M79641 Pain in right hand: Secondary | ICD-10-CM

## 2022-07-23 DIAGNOSIS — M6281 Muscle weakness (generalized): Secondary | ICD-10-CM

## 2022-07-23 DIAGNOSIS — M25631 Stiffness of right wrist, not elsewhere classified: Secondary | ICD-10-CM

## 2022-07-23 DIAGNOSIS — M25641 Stiffness of right hand, not elsewhere classified: Secondary | ICD-10-CM

## 2022-07-23 DIAGNOSIS — G8929 Other chronic pain: Secondary | ICD-10-CM
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
## 2022-07-27 ENCOUNTER — Telehealth: Payer: Self-pay

## 2022-07-27 ENCOUNTER — Encounter: Payer: Self-pay | Admitting: Physical Therapy

## 2022-07-27 ENCOUNTER — Ambulatory Visit: Payer: BC Managed Care – PPO | Admitting: Occupational Therapy

## 2022-07-27 ENCOUNTER — Ambulatory Visit: Payer: BC Managed Care – PPO | Admitting: Physical Therapy

## 2022-07-27 DIAGNOSIS — M25641 Stiffness of right hand, not elsewhere classified: Secondary | ICD-10-CM

## 2022-07-27 DIAGNOSIS — M6281 Muscle weakness (generalized): Secondary | ICD-10-CM

## 2022-07-27 DIAGNOSIS — M25631 Stiffness of right wrist, not elsewhere classified: Secondary | ICD-10-CM

## 2022-07-27 DIAGNOSIS — G8929 Other chronic pain: Secondary | ICD-10-CM

## 2022-07-27 DIAGNOSIS — M79641 Pain in right hand: Secondary | ICD-10-CM

## 2022-07-27 MED ORDER — METOPROLOL SUCCINATE ER 25 MG PO TB24: 75.0000 mg | ORAL_TABLET | Freq: Every day | ORAL | 3 refills | Status: DC

## 2022-07-27 NOTE — Telephone Encounter (Signed)
Left message for wife that Dr Silvio Pate said to go ahead and send in for 3 a day if that is what he has been taking. He also asked that at his next visit if she could come with him or at least make him a list of his medications and how he takes them, that would be great.   Rx sent to [facility].
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
## 2022-07-27 NOTE — Addendum Note (Signed)
Addended by: Pilar Grammes on: 07/27/2022 01:46 PM   Modules accepted: Orders

## 2022-07-27 NOTE — Telephone Encounter (Signed)
Patient's wife is calling in stating that they recently got a prescription for metoprolol succinate (TOPROL-XL) 25 MG 24 hr tablet (Expired) and normally the dosage is 75 MG, but they have a 25 MG with instructions take one daily. Wife wants to know why this was changed.
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
## 2022-07-27 NOTE — Telephone Encounter (Signed)
Called and spoke to pt's wife per DPR. She wanted to know why the rx for metoprolol was changed. I advised her that at his OV in March, he advised [place] he was only taking 1 metoprolol '25mg'$ . She stated that is not correct because she does his pills for him and she gives him 3 daily. She said he has heart issues and strong family history of heart disease and does not know why we would have lowered it. I advised I can only go by what the OV note stated. She said he needs to be seen to go back over this. I said we can make him a visit. Also asked her if she has been checking his BP. She went on to say she was not going to go through this with me and proceeded to hang up on me.

## 2022-07-29 ENCOUNTER — Ambulatory Visit: Payer: BC Managed Care – PPO | Admitting: Physical Therapy

## 2022-07-30 ENCOUNTER — Ambulatory Visit: Payer: BC Managed Care – PPO | Admitting: Occupational Therapy

## 2022-07-30 ENCOUNTER — Encounter: Payer: Self-pay | Admitting: Physical Therapy

## 2022-07-30 ENCOUNTER — Ambulatory Visit: Payer: BC Managed Care – PPO | Admitting: Physical Therapy

## 2022-07-30 DIAGNOSIS — G8929 Other chronic pain: Secondary | ICD-10-CM

## 2022-07-30 DIAGNOSIS — M25631 Stiffness of right wrist, not elsewhere classified: Secondary | ICD-10-CM

## 2022-07-30 DIAGNOSIS — M25641 Stiffness of right hand, not elsewhere classified: Secondary | ICD-10-CM | POA: Diagnosis not present

## 2022-07-30 DIAGNOSIS — M6281 Muscle weakness (generalized): Secondary | ICD-10-CM

## 2022-07-30 DIAGNOSIS — M79641 Pain in right hand: Secondary | ICD-10-CM
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
## 2022-08-03 ENCOUNTER — Ambulatory Visit: Payer: BC Managed Care – PPO | Admitting: Physical Therapy

## 2022-08-04 ENCOUNTER — Ambulatory Visit: Payer: BC Managed Care – PPO | Admitting: Physical Therapy

## 2022-08-04 ENCOUNTER — Ambulatory Visit: Payer: BC Managed Care – PPO | Admitting: Occupational Therapy

## 2022-08-04 DIAGNOSIS — M6281 Muscle weakness (generalized): Secondary | ICD-10-CM

## 2022-08-04 DIAGNOSIS — G8929 Other chronic pain: Secondary | ICD-10-CM

## 2022-08-04 DIAGNOSIS — M25641 Stiffness of right hand, not elsewhere classified: Secondary | ICD-10-CM | POA: Diagnosis not present

## 2022-08-04 DIAGNOSIS — M79641 Pain in right hand: Secondary | ICD-10-CM

## 2022-08-04 DIAGNOSIS — M25631 Stiffness of right wrist, not elsewhere classified: Secondary | ICD-10-CM
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
## 2022-08-05 ENCOUNTER — Ambulatory Visit: Payer: BC Managed Care – PPO | Admitting: Physical Therapy

## 2022-08-06 ENCOUNTER — Ambulatory Visit: Payer: BC Managed Care – PPO | Admitting: Occupational Therapy

## 2022-08-06 ENCOUNTER — Encounter: Payer: Self-pay | Admitting: Physical Therapy

## 2022-08-06 ENCOUNTER — Ambulatory Visit: Payer: BC Managed Care – PPO | Admitting: Physical Therapy

## 2022-08-06 DIAGNOSIS — G8929 Other chronic pain: Secondary | ICD-10-CM

## 2022-08-06 DIAGNOSIS — M25641 Stiffness of right hand, not elsewhere classified: Secondary | ICD-10-CM | POA: Diagnosis not present

## 2022-08-06 DIAGNOSIS — M6281 Muscle weakness (generalized): Secondary | ICD-10-CM

## 2022-08-06 DIAGNOSIS — M79641 Pain in right hand: Secondary | ICD-10-CM

## 2022-08-06 DIAGNOSIS — M25631 Stiffness of right wrist, not elsewhere classified: Secondary | ICD-10-CM
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
## 2022-08-12 ENCOUNTER — Encounter: Payer: BC Managed Care – PPO | Admitting: Physical Therapy

## 2022-08-14 ENCOUNTER — Ambulatory Visit: Payer: BC Managed Care – PPO | Admitting: Occupational Therapy

## 2022-08-18 ENCOUNTER — Encounter: Payer: BC Managed Care – PPO | Admitting: Physical Therapy

## 2022-08-20 ENCOUNTER — Encounter: Payer: BC Managed Care – PPO | Admitting: Physical Therapy

## 2022-08-24 ENCOUNTER — Encounter: Payer: BC Managed Care – PPO | Admitting: Physical Therapy

## 2022-08-27 ENCOUNTER — Encounter: Payer: BC Managed Care – PPO | Admitting: Physical Therapy

## 2022-09-01 ENCOUNTER — Encounter: Payer: BC Managed Care – PPO | Admitting: Physical Therapy

## 2022-09-03 ENCOUNTER — Encounter: Payer: BC Managed Care – PPO | Admitting: Physical Therapy

## 2022-09-07 ENCOUNTER — Encounter: Payer: BC Managed Care – PPO | Admitting: Physical Therapy

## 2022-09-08 ENCOUNTER — Ambulatory Visit: Payer: BC Managed Care – PPO | Admitting: Internal Medicine

## 2022-09-10 ENCOUNTER — Encounter: Payer: BC Managed Care – PPO | Admitting: Physical Therapy

## 2022-09-17 ENCOUNTER — Encounter: Payer: BC Managed Care – PPO | Admitting: Physical Therapy

## 2022-12-22 ENCOUNTER — Encounter: Payer: BC Managed Care – PPO | Admitting: Internal Medicine

## 2023-01-01 ENCOUNTER — Other Ambulatory Visit: Payer: Self-pay | Admitting: Internal Medicine

## 2023-01-05 ENCOUNTER — Telehealth: Payer: Self-pay | Admitting: Internal Medicine

## 2023-01-05 NOTE — Telephone Encounter (Signed)
I spoke with pts wife (DPR signed) pt already has appt with Dr Glori Bickers 01/06/23; pts wife said pt is not in any distress at this time and can wait for appt on 01/06/23 with Dr Glori Bickers. Offered pts wife sooner appt for pt and she said that pt will keep already scheduled appt because the pt does not miss work. UC & ED precautions given and pt's wife voiced understanding. Sending note to Dr Glori Bickers and notified [place] CMA.

## 2023-01-05 NOTE — Telephone Encounter (Signed)
Patients wife called in stating that her husband has been dealing with a cough and sob for a while now,that they think have turned into some type of infection.Due to the sob I sent to access nurse to be triaged.

## 2023-01-05 NOTE — Telephone Encounter (Signed)
[facility] - Client TELEPHONE ADVICE RECORD [facility] Patient Name: Harold Robinson Gender: Male DOB: 14-Feb-1971 Age: 52 Y 51 M 6 D Return Phone Number: [phone] (Primary), [phone] (Secondary) Address[place]  [address] Client [facility] - Client Client Site [place] - Day Provider Viviana Simpler- MD Contact Type Call Who Is Calling Patient / Member / Family / Caregiver Call Type Triage / Clinical Relationship To Patient Self Return Phone Number [phone] (Secondary) Chief Complaint BREATHING - shortness of breath or sounds breathless Reason for Call Symptomatic / Request for [name] states he is experiencing a cough with shortness of breath. Translation No No Triage Reason Other Nurse Assessment Nurse: Dellie Catholic, RN, Cindy Date/Time (Eastern Time): 01/05/2023 9:32:27 AM Confirm and document reason for call. If symptomatic, describe symptoms. ---Caller states her husband is having difficulty breathing for a week . she is not with pt Does the patient have any new or worsening symptoms? ---Yes Will a triage be completed? ---No Select reason for no triage. ---Other Please document clinical information provided and list any resource used. ---Caller not with patient. Disp. Time Eilene Ghazi Time) Disposition Final User 01/05/2023 9:29:16 AM Send to Urgent Oris Drone 01/05/2023 9:37:28 AM Attempt made - message left Jonnie Kind 01/05/2023 9:38:50 AM Send To RN Personal Dellie Catholic, RN, Cindy 01/05/2023 9:51:09 AM Attempt made - message left Dellie Catholic, RN, Cindy 01/05/2023 9:51:37 AM Send To RN [place], RN, Cindy 01/05/2023 10:04:13 AM FINAL ATTEMPT MADE - message left Dellie Catholic, RN, Jenny Reichmann 01/05/2023 10:06:39 AM FINAL ATTEMPT MADE - message left Yes Dellie Catholic, RN, Jenny Reichmann Final Disposition 01/05/2023 10:06:39 AM FINAL ATTEMPT  MADE - message left Yes [place], RN, Cindy PLEASE NOTE: All timestamps contained within this report are represented as [place] Standard Time. CONFIDENTIALTY NOTICE: This fax transmission is intended only for the addressee. It contains information that is legally privileged, confidential or otherwise protected from use or disclosure. If you are not the intended recipient, you are strictly prohibited from reviewing, disclosing, copying using or disseminating any of this information or taking any action in reliance on or regarding this information. If you have received this fax in error, please notify us immediately by telephone so that we can arrange for its return to [place]. Phone: [phone], Toll-Free: [phone], Fax: [phone] Page: 2 of 2 Call Id: [id number] Comments User: Gillian Shields, RN Date/Time Eilene Ghazi Time): 01/05/2023 10:05:17 AM Attempted to reach caller max attempts made . Transferred caller ( who is not the pt ) to front desk per her request . User: Gillian Shields, RN Date/Time Eilene Ghazi Time): 01/05/2023 10:06:24 AM *CORRECTION* Attempted to reach patient max attempts made . Transferred caller ( who is not the pt ) to front desk per her request

## 2023-01-05 NOTE — Telephone Encounter (Signed)
Aware Will see him then  Agree with ER precautions

## 2023-01-06 ENCOUNTER — Encounter: Payer: Self-pay | Admitting: Family Medicine

## 2023-01-06 ENCOUNTER — Ambulatory Visit (INDEPENDENT_AMBULATORY_CARE_PROVIDER_SITE_OTHER)
Admission: RE | Admit: 2023-01-06 | Discharge: 2023-01-06 | Disposition: A | Discharge status: Home / Self Care | Payer: BC Managed Care – PPO | Source: Ambulatory Visit | Attending: Family Medicine | Admitting: Family Medicine

## 2023-01-06 ENCOUNTER — Ambulatory Visit: Payer: No Typology Code available for payment source | Admitting: Family Medicine

## 2023-01-06 VITALS — BP 142/68 | HR 55 | Temp 97.3°F | Ht 71.0 in | Wt 262.4 lb

## 2023-01-06 DIAGNOSIS — R052 Subacute cough: Secondary | ICD-10-CM

## 2023-01-06 DIAGNOSIS — J209 Acute bronchitis, unspecified: Secondary | ICD-10-CM

## 2023-01-06 DIAGNOSIS — R059 Cough, unspecified: Secondary | ICD-10-CM | POA: Insufficient documentation

## 2023-01-06 HISTORY — DX: Acute bronchitis, unspecified: J20.9

## 2023-01-06 LAB — POC COVID19 BINAXNOW: SARS Coronavirus 2 Ag: NEGATIVE

## 2023-01-06 MED ORDER — ALBUTEROL SULFATE HFA 108 (90 BASE) MCG/ACT IN AERS: 2.0000 | INHALATION_SPRAY | RESPIRATORY_TRACT | 0 refills | Status: DC | PRN

## 2023-01-06 MED ORDER — PREDNISONE 20 MG PO TABS: ORAL_TABLET | ORAL | 0 refills | Status: DC
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
## 2023-01-06 NOTE — Patient Instructions (Addendum)
Drink fluids and rest   Take the prednisone as directed It will make you feel hyper and hungry   Use the albuterol inhaler as needed for wheezing and tightness  Chest xray today  We will contact you with a result   If symptoms get severe, if you oxygen gets low then please go to ER for urgent attention    Update if not starting to improve in a week or if worsening   Follow up with Dr Silvio Pate next week for a re check

## 2023-01-07 ENCOUNTER — Telehealth: Payer: Self-pay | Admitting: Internal Medicine

## 2023-01-07 ENCOUNTER — Other Ambulatory Visit: Payer: Self-pay | Admitting: Internal Medicine

## 2023-01-07 DIAGNOSIS — J849 Interstitial pulmonary disease, unspecified: Secondary | ICD-10-CM

## 2023-01-07 NOTE — Telephone Encounter (Signed)
Spoke to pt's wife per DPR. Discussed xray results.

## 2023-01-07 NOTE — Telephone Encounter (Signed)
Patient wife called to get results from the chest x-ray tomorrow and also wanted to know if she needs to get more medication.

## 2023-01-08 ENCOUNTER — Telehealth: Payer: Self-pay | Admitting: Internal Medicine

## 2023-01-08 MED ORDER — DOXYCYCLINE HYCLATE 100 MG PO TABS
100.0000 mg | ORAL_TABLET | Freq: Two times a day (BID) | ORAL | 0 refills | Status: DC
Start: 2023-01-08 — End: 2023-01-29

## 2023-01-08 NOTE — Telephone Encounter (Signed)
Will route to Dr. Glori Bickers who eval him and PCP who he has a f/u with next week

## 2023-01-08 NOTE — Telephone Encounter (Signed)
Patient was seen on 01/06/23 and diagnosed with bronchitis,and patient isn't feeling better and would like to know if there is an antibiotic that  can be prescribed to help him. He is scheduled for a f/u appointment on 01/11/2023,but would like to know if there is something he can have today?

## 2023-01-08 NOTE — Telephone Encounter (Signed)
Spoke to pt. 

## 2023-01-08 NOTE — Telephone Encounter (Signed)
Please let him know that I did send an antibiotic to see if that will help

## 2023-01-11 ENCOUNTER — Ambulatory Visit: Payer: No Typology Code available for payment source | Admitting: Internal Medicine

## 2023-01-13 ENCOUNTER — Encounter: Payer: Self-pay | Admitting: Internal Medicine

## 2023-01-13 ENCOUNTER — Ambulatory Visit (INDEPENDENT_AMBULATORY_CARE_PROVIDER_SITE_OTHER): Payer: BC Managed Care – PPO | Admitting: Internal Medicine

## 2023-01-13 VITALS — BP 130/82 | HR 59 | Temp 97.5°F | Ht 71.0 in | Wt 262.0 lb

## 2023-01-13 DIAGNOSIS — J4530 Mild persistent asthma, uncomplicated: Secondary | ICD-10-CM | POA: Insufficient documentation

## 2023-01-13 DIAGNOSIS — J4531 Mild persistent asthma with (acute) exacerbation: Secondary | ICD-10-CM | POA: Insufficient documentation

## 2023-01-13 DIAGNOSIS — J849 Interstitial pulmonary disease, unspecified: Secondary | ICD-10-CM
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
## 2023-01-21 ENCOUNTER — Encounter: Payer: BC Managed Care – PPO | Admitting: Internal Medicine

## 2023-01-29 ENCOUNTER — Encounter: Payer: Self-pay | Admitting: Pulmonary Disease

## 2023-01-29 ENCOUNTER — Ambulatory Visit (INDEPENDENT_AMBULATORY_CARE_PROVIDER_SITE_OTHER): Payer: BC Managed Care – PPO | Admitting: Pulmonary Disease

## 2023-01-29 VITALS — BP 132/76 | HR 61 | Temp 98.1°F | Ht 71.0 in | Wt 259.0 lb

## 2023-01-29 DIAGNOSIS — J849 Interstitial pulmonary disease, unspecified: Secondary | ICD-10-CM | POA: Diagnosis not present

## 2023-01-29 DIAGNOSIS — R0602 Shortness of breath: Secondary | ICD-10-CM

## 2023-01-29 DIAGNOSIS — G4733 Obstructive sleep apnea (adult) (pediatric): Secondary | ICD-10-CM | POA: Diagnosis not present

## 2023-01-29 MED ORDER — BUDESONIDE-FORMOTEROL FUMARATE 160-4.5 MCG/ACT IN AERO: 2.0000 | INHALATION_SPRAY | Freq: Two times a day (BID) | RESPIRATORY_TRACT | 5 refills | Status: DC
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
## 2023-01-29 NOTE — Patient Instructions (Signed)
Will send a prescription for Symbicort Schedule high-res CT and PFTs at next available Return to clinic in 1 to 2 months.

## 2023-02-11 ENCOUNTER — Other Ambulatory Visit: Payer: Self-pay | Admitting: Internal Medicine

## 2023-02-11 ENCOUNTER — Encounter: Payer: Self-pay | Admitting: Radiology

## 2023-02-11 ENCOUNTER — Other Ambulatory Visit: Payer: Self-pay | Admitting: Family Medicine

## 2023-02-11 DIAGNOSIS — F32A Depression, unspecified: Secondary | ICD-10-CM

## 2023-02-11 NOTE — Telephone Encounter (Signed)
Rx sent electronically.  

## 2023-02-11 NOTE — Telephone Encounter (Signed)
I think his CPE on March 27 is scheduled wrong. It is on [facility] schedule and not Dr Silvio Pate.

## 2023-02-12 NOTE — Telephone Encounter (Signed)
[email or web address]

## 2023-02-17 ENCOUNTER — Other Ambulatory Visit: Payer: No Typology Code available for payment source

## 2023-02-19 ENCOUNTER — Encounter: Payer: BC Managed Care – PPO | Admitting: Internal Medicine

## 2023-02-19 ENCOUNTER — Ambulatory Visit
Admission: RE | Admit: 2023-02-19 | Discharge: 2023-02-19 | Disposition: A | Discharge status: Home / Self Care | Payer: BC Managed Care – PPO | Source: Ambulatory Visit | Attending: Pulmonary Disease | Admitting: Pulmonary Disease

## 2023-02-19 DIAGNOSIS — R0602 Shortness of breath: Secondary | ICD-10-CM

## 2023-02-24 ENCOUNTER — Encounter: Payer: Self-pay | Admitting: Pulmonary Disease

## 2023-02-24 ENCOUNTER — Ambulatory Visit (INDEPENDENT_AMBULATORY_CARE_PROVIDER_SITE_OTHER): Payer: BC Managed Care – PPO | Admitting: Pulmonary Disease

## 2023-02-24 ENCOUNTER — Ambulatory Visit: Payer: BC Managed Care – PPO | Admitting: Pulmonary Disease

## 2023-02-24 VITALS — BP 142/72 | HR 58 | Temp 98.2°F | Ht 71.0 in | Wt 259.8 lb

## 2023-02-24 DIAGNOSIS — R0602 Shortness of breath: Secondary | ICD-10-CM | POA: Diagnosis not present

## 2023-02-24 DIAGNOSIS — J4530 Mild persistent asthma, uncomplicated: Secondary | ICD-10-CM | POA: Diagnosis not present

## 2023-02-24 DIAGNOSIS — G4733 Obstructive sleep apnea (adult) (pediatric): Secondary | ICD-10-CM | POA: Diagnosis not present

## 2023-02-24 LAB — PULMONARY FUNCTION TEST
DL/VA % pred: 98 %
DL/VA: 4.37 ml/min/mmHg/L
DLCO cor % pred: 72 %
DLCO cor: 22.09 ml/min/mmHg
DLCO unc % pred: 72 %
DLCO unc: 22.09 ml/min/mmHg
FEF 25-75 Post: 2.54 L/sec
FEF 25-75 Pre: 3.49 L/sec
FEF2575-%Change-Post: -27 %
FEF2575-%Pred-Post: 71 %
FEF2575-%Pred-Pre: 97 %
FEV1-%Change-Post: -8 %
FEV1-%Pred-Post: 66 %
FEV1-%Pred-Pre: 72 %
FEV1-Post: 2.7 L
FEV1-Pre: 2.95 L
FEV1FVC-%Change-Post: -14 %
FEV1FVC-%Pred-Pre: 108 %
FEV6-%Change-Post: 8 %
FEV6-%Pred-Post: 74 %
FEV6-%Pred-Pre: 68 %
FEV6-Post: 3.74 L
FEV6-Pre: 3.46 L
FEV6FVC-%Pred-Post: 103 %
FEV6FVC-%Pred-Pre: 103 %
FVC-%Change-Post: 6 %
FVC-%Pred-Post: 71 %
FVC-%Pred-Pre: 67 %
FVC-Post: 3.74 L
FVC-Pre: 3.51 L
Post FEV1/FVC ratio: 72 %
Post FEV6/FVC ratio: 100 %
Pre FEV1/FVC ratio: 84 %
Pre FEV6/FVC Ratio: 100 %
RV % pred: 80 %
RV: 1.69 L
TLC % pred: 80 %
TLC: 5.79 L

## 2023-02-24 NOTE — Progress Notes (Signed)
Full PFT performed today. °
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
## 2023-02-24 NOTE — Patient Instructions (Signed)
Your CT scan does not show any interstitial lung disease which is good news Continue using the inhalers as prescribed Follow-up in 6 months

## 2023-02-24 NOTE — Patient Instructions (Signed)
Full PFT performed today. °

## 2023-02-26 ENCOUNTER — Other Ambulatory Visit: Payer: Self-pay | Admitting: Internal Medicine

## 2023-02-26 DIAGNOSIS — F32A Anxiety disorder, unspecified: Secondary | ICD-10-CM

## 2023-03-03 ENCOUNTER — Encounter: Payer: BC Managed Care – PPO | Admitting: Primary Care

## 2023-03-24 ENCOUNTER — Encounter: Payer: Self-pay | Admitting: Primary Care

## 2023-03-24 ENCOUNTER — Ambulatory Visit (INDEPENDENT_AMBULATORY_CARE_PROVIDER_SITE_OTHER): Payer: BC Managed Care – PPO | Admitting: Primary Care

## 2023-03-24 VITALS — BP 134/80 | HR 50 | Temp 98.0°F | Ht 71.0 in | Wt 256.0 lb

## 2023-03-24 DIAGNOSIS — I10 Essential (primary) hypertension: Secondary | ICD-10-CM

## 2023-03-24 DIAGNOSIS — G4733 Obstructive sleep apnea (adult) (pediatric): Secondary | ICD-10-CM | POA: Diagnosis not present

## 2023-03-24 DIAGNOSIS — Z125 Encounter for screening for malignant neoplasm of prostate: Secondary | ICD-10-CM | POA: Diagnosis not present

## 2023-03-24 DIAGNOSIS — Z0001 Encounter for general adult medical examination with abnormal findings: Secondary | ICD-10-CM

## 2023-03-24 DIAGNOSIS — J4530 Mild persistent asthma, uncomplicated: Secondary | ICD-10-CM

## 2023-03-24 DIAGNOSIS — E782 Mixed hyperlipidemia: Secondary | ICD-10-CM

## 2023-03-24 DIAGNOSIS — Z0000 Encounter for general adult medical examination without abnormal findings: Secondary | ICD-10-CM | POA: Insufficient documentation

## 2023-03-24 DIAGNOSIS — Z23 Encounter for immunization: Secondary | ICD-10-CM

## 2023-03-24 DIAGNOSIS — F39 Unspecified mood [affective] disorder: Secondary | ICD-10-CM

## 2023-03-24 DIAGNOSIS — I2510 Atherosclerotic heart disease of native coronary artery without angina pectoris: Secondary | ICD-10-CM

## 2023-03-24 MED ORDER — SERTRALINE HCL 50 MG PO TABS: 50.0000 mg | ORAL_TABLET | Freq: Every day | ORAL | 0 refills | Status: DC

## 2023-03-24 NOTE — Assessment & Plan Note (Signed)
Compliant to CPAP nightly, follows with ENT. Reviewed ENT notes from February 2024.

## 2023-03-24 NOTE — Assessment & Plan Note (Signed)
Asymptomatic.  Continue lipid and BP control. Continue aspirin 81 mg daily.

## 2023-03-24 NOTE — Patient Instructions (Signed)
Stop by the lab prior to leaving today. I will notify you of your results once received.   We increased the dose of your Zoloft to 150 mg. Take the 50 mg tablet with the 100 mg tablet daily.  You are due for your colonoscopy in October of 2024.  Talk to your pulmonologist about the frequent use of the albuterol rescue inhaler.   Set up a nurse visit for your second Shingles vaccine in 2-6 months.  It was a pleasure to see you today!

## 2023-03-24 NOTE — Assessment & Plan Note (Addendum)
Following with pulmonology, reviewed office notes and CT chest from March 2024.  Frequent use of SABA, dicussed to notify pulmonologist.  Continue Symbicort 160-4.5 mg, BID.

## 2023-03-24 NOTE — Assessment & Plan Note (Signed)
Deteriorated.  Increase Zoloft to 150 mg daily. He will update in a few weeks.

## 2023-03-24 NOTE — Assessment & Plan Note (Signed)
Repeat lipid panel pending.  Discussed the importance of a healthy diet and regular exercise in order for weight loss, and to reduce the risk of further co-morbidity. Continue atorvastatin 40 mg daily.  

## 2023-03-24 NOTE — Assessment & Plan Note (Signed)
Controlled  Continue losartan 100 mg daily and metoprolol succinate 75 mg daily. CMP pending.

## 2023-03-24 NOTE — Assessment & Plan Note (Signed)
Tetanus due, provided today. First Shingrix due, provided today. Colonoscopy UTD, due October 2024, he is aware PSA due and pending.  Discussed the importance of a healthy diet and regular exercise in order for weight loss, and to reduce the risk of further co-morbidity.  Exam stable. Labs pending.  Follow up in 1 year for repeat physical.
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
## 2023-03-25 LAB — LIPID PANEL
Cholesterol: 113 mg/dL (ref 0–200)
HDL: 32.4 mg/dL — ABNORMAL LOW (ref >39.00)
LDL Cholesterol: 47 mg/dL (ref 0–99)
NonHDL: 80.31
Total CHOL/HDL Ratio: 3
Triglycerides: 169 mg/dL — ABNORMAL HIGH (ref 0.0–149.0)
VLDL: 33.8 mg/dL (ref 0.0–40.0)

## 2023-03-25 LAB — COMPREHENSIVE METABOLIC PANEL
ALT: 36 U/L (ref 0–53)
AST: 23 U/L (ref 0–37)
Albumin: 4.5 g/dL (ref 3.5–5.2)
Alkaline Phosphatase: 84 U/L (ref 39–117)
BUN: 20 mg/dL (ref 6–23)
CO2: 27 mEq/L (ref 19–32)
Calcium: 9.7 mg/dL (ref 8.4–10.5)
Chloride: 102 mEq/L (ref 96–112)
Creatinine, Ser: 1.26 mg/dL (ref 0.40–1.50)
GFR: 66.44 mL/min (ref >60.00)
Glucose, Bld: 87 mg/dL (ref 70–99)
Potassium: 4.6 mEq/L (ref 3.5–5.1)
Sodium: 139 mEq/L (ref 135–145)
Total Bilirubin: 1.8 mg/dL — ABNORMAL HIGH (ref 0.2–1.2)
Total Protein: 7.1 g/dL (ref 6.0–8.3)

## 2023-03-25 LAB — PSA: PSA: 0.29 ng/mL (ref 0.10–4.00)

## 2023-03-25 LAB — HEMOGLOBIN A1C: Hgb A1c MFr Bld: 6.2 % (ref 4.6–6.5)

## 2023-04-23 ENCOUNTER — Other Ambulatory Visit: Payer: Self-pay | Admitting: Internal Medicine

## 2023-04-26 ENCOUNTER — Other Ambulatory Visit: Payer: Self-pay | Admitting: Internal Medicine

## 2023-04-26 NOTE — Telephone Encounter (Signed)
Received refill request for metoprolol succinate. Looks like he has refills on file with his pharmacy that will last until August 2024. He needs to call his pharmacy for a refill.

## 2023-04-27 ENCOUNTER — Ambulatory Visit: Payer: BC Managed Care – PPO | Admitting: Family Medicine

## 2023-04-27 ENCOUNTER — Encounter: Payer: Self-pay | Admitting: Family Medicine

## 2023-04-27 VITALS — BP 140/80 | HR 57 | Temp 97.1°F | Ht 71.0 in | Wt 258.0 lb

## 2023-04-27 DIAGNOSIS — J4531 Mild persistent asthma with (acute) exacerbation: Secondary | ICD-10-CM | POA: Diagnosis not present

## 2023-04-27 MED ORDER — PREDNISONE 10 MG PO TABS: ORAL_TABLET | ORAL | 0 refills | Status: DC

## 2023-04-27 MED ORDER — AZITHROMYCIN 250 MG PO TABS: ORAL_TABLET | ORAL | 0 refills | Status: DC

## 2023-04-27 NOTE — Telephone Encounter (Signed)
Called and left voicemail, per dpr advising patient of Isabella Stalling message.

## 2023-04-27 NOTE — Assessment & Plan Note (Signed)
Acute flare, no clear trigger.  Patient did seem somewhat confused about Symbicort versus albuterol but it appears he is likely taking meds correctly. Encouraged him to consider allergy medication, but he states there are no clear triggers for allergies. Will complete prednisone taper, given his increase in hunger on prednisone we will do low-dose for longer.  No clear sign at this point of bacterial infection but given symptoms are going on greater than 7 to 10 days, if he is not improving with prednisone taper we will treat with azithromycin.
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
## 2023-04-29 ENCOUNTER — Telehealth: Payer: Self-pay | Admitting: Primary Care

## 2023-04-29 NOTE — Telephone Encounter (Signed)
Prescription Request  04/29/2023  LOV: 03/24/2023  What is the name of the medication or equipment?  sertraline (ZOLOFT) 50 MG tablet  Have you contacted your pharmacy to request a refill? Yes   Which pharmacy would you like this sent to?  [id number],  - [address] [place] [address] Phone: [phone] Fax: [phone]    Patient notified that their request is being sent to the clinical staff for review and that they should receive a response within 2 business days.   Please advise at [facility] [phone]  Patient dosage was increased to 150mg . They were able to get the 100 mg,but not the 50mg .

## 2023-04-29 NOTE — Telephone Encounter (Signed)
Called and spoke with patient, he was unable to tell me if he was out of medication or if he picked up the Zoloft 50mg  from the pharmacy in April. He stated his wife was the one who called this morning to provide this information.   Unable to reach patients wife, could not leave a voicemail.

## 2023-04-29 NOTE — Telephone Encounter (Addendum)
Unable to reach patients wife, unable to leave voicemail.   Called patients pharmacy she stated they have the Zoloft 50mg  on patients profile, however it was put back on the shelf because he did not pick up in April. She stated they will fill this for patient now.   Called and left voicemail advising patient of this and to call back with clarification on what dose he has been taking.

## 2023-04-30 NOTE — Telephone Encounter (Signed)
Called and spoke with patient, advised him the Zoloft 50mg  should be ready at his pharmacy. He states he has just been doing the Zoloft 100mg  tab and had not increased his dose yet. He will pick up from pharmacy and update how he does with increased dose.

## 2023-05-05 ENCOUNTER — Other Ambulatory Visit: Payer: Self-pay | Admitting: Internal Medicine

## 2023-05-05 DIAGNOSIS — F419 Anxiety disorder, unspecified: Secondary | ICD-10-CM

## 2023-05-19 ENCOUNTER — Other Ambulatory Visit: Payer: Self-pay | Admitting: Internal Medicine

## 2023-05-19 ENCOUNTER — Other Ambulatory Visit: Payer: Self-pay | Admitting: Primary Care

## 2023-05-19 DIAGNOSIS — F419 Anxiety disorder, unspecified: Secondary | ICD-10-CM

## 2023-05-19 DIAGNOSIS — F39 Unspecified mood [affective] disorder: Secondary | ICD-10-CM

## 2023-07-10 ENCOUNTER — Other Ambulatory Visit: Payer: Self-pay | Admitting: Internal Medicine

## 2023-07-10 ENCOUNTER — Other Ambulatory Visit: Payer: Self-pay | Admitting: Primary Care

## 2023-07-10 DIAGNOSIS — I10 Essential (primary) hypertension: Secondary | ICD-10-CM

## 2023-07-10 DIAGNOSIS — F39 Unspecified mood [affective] disorder: Secondary | ICD-10-CM

## 2023-07-10 DIAGNOSIS — J4530 Mild persistent asthma, uncomplicated: Secondary | ICD-10-CM

## 2023-08-12 ENCOUNTER — Other Ambulatory Visit: Payer: Self-pay | Admitting: Internal Medicine

## 2023-08-12 ENCOUNTER — Other Ambulatory Visit: Payer: Self-pay | Admitting: Primary Care

## 2023-08-12 DIAGNOSIS — I10 Essential (primary) hypertension: Secondary | ICD-10-CM

## 2023-08-12 DIAGNOSIS — J4530 Mild persistent asthma, uncomplicated: Secondary | ICD-10-CM

## 2023-09-08 ENCOUNTER — Other Ambulatory Visit: Payer: Self-pay | Admitting: Internal Medicine

## 2023-09-08 DIAGNOSIS — I10 Essential (primary) hypertension: Secondary | ICD-10-CM

## 2023-09-09 ENCOUNTER — Other Ambulatory Visit: Payer: Self-pay | Admitting: Primary Care

## 2023-09-09 DIAGNOSIS — J4530 Mild persistent asthma, uncomplicated: Secondary | ICD-10-CM

## 2023-09-24 ENCOUNTER — Encounter: Payer: Self-pay | Admitting: Primary Care

## 2023-09-24 ENCOUNTER — Ambulatory Visit: Payer: BC Managed Care – PPO | Admitting: Primary Care

## 2023-09-24 VITALS — BP 128/64 | HR 55 | Temp 97.1°F | Ht 71.0 in | Wt 265.0 lb

## 2023-09-24 DIAGNOSIS — J4530 Mild persistent asthma, uncomplicated: Secondary | ICD-10-CM | POA: Diagnosis not present

## 2023-09-24 DIAGNOSIS — I2510 Atherosclerotic heart disease of native coronary artery without angina pectoris: Secondary | ICD-10-CM | POA: Diagnosis not present

## 2023-09-24 DIAGNOSIS — Z23 Encounter for immunization: Secondary | ICD-10-CM | POA: Diagnosis not present

## 2023-09-24 DIAGNOSIS — R0609 Other forms of dyspnea: Secondary | ICD-10-CM | POA: Diagnosis not present

## 2023-09-24 MED ORDER — MONTELUKAST SODIUM 10 MG PO TABS: 10.0000 mg | ORAL_TABLET | Freq: Every day | ORAL | 0 refills | Status: AC

## 2023-09-24 NOTE — Assessment & Plan Note (Signed)
Symptomatic.  Referral placed to cardiology for further evaluation. Reviewed cardiac catheterization from July 2022.

## 2023-09-24 NOTE — Assessment & Plan Note (Signed)
Reviewed pulmonology notes from March 2024. Reviewed CT chest from April 2024.  Continue Symbicort 160-4.5 mcg, 2 puffs twice daily. Continue albuterol inhaler as needed.  Discussed to use sparingly and only if needed.  Start montelukast 10 mg at bedtime.  Referral placed to cardiology.

## 2023-09-24 NOTE — Patient Instructions (Signed)
Start montelukast (Singulair) 10 mg for allergy/asthma.  Take 1 tablet by mouth at bedtime.  You will either be contacted via phone regarding your referral to cardiology, or you may receive a letter on your MyChart portal from our referral team with instructions for scheduling an appointment. Please let us know if you have not been contacted by anyone within two weeks.  Use the albuterol inhaler only if needed.  It was a pleasure to see you today!

## 2023-09-24 NOTE — Assessment & Plan Note (Addendum)
Suspicious for cardiac cause.  Reviewed CT chest from April 2024. Reviewed hospital notes and cardiac catheterization from July 2022.  Strongly advised weight loss. Continue atorvastatin 40 mg daily, LDL is at goal.  ECG today with sinus bradycardia with rate of 56.  No acute ST changes, no PAC/PVCs.  Appears similar to EKG from July 2022.  Referral placed to cardiology for further evaluation.  Will also treat with Singulair 10 mg at bedtime to see if this helps with allergy/asthma symptoms.
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
## 2023-10-15 ENCOUNTER — Other Ambulatory Visit: Payer: Self-pay | Admitting: Primary Care

## 2023-10-15 ENCOUNTER — Other Ambulatory Visit: Payer: Self-pay | Admitting: Internal Medicine

## 2023-10-15 DIAGNOSIS — J4530 Mild persistent asthma, uncomplicated: Secondary | ICD-10-CM

## 2023-10-15 DIAGNOSIS — I10 Essential (primary) hypertension: Secondary | ICD-10-CM

## 2023-10-19 ENCOUNTER — Other Ambulatory Visit: Payer: Self-pay | Admitting: Internal Medicine

## 2023-10-19 ENCOUNTER — Telehealth: Payer: Self-pay | Admitting: Primary Care

## 2023-10-19 DIAGNOSIS — I10 Essential (primary) hypertension: Secondary | ICD-10-CM

## 2023-10-19 NOTE — Telephone Encounter (Signed)
Please call patient:  A 1 year supply of his atorvastatin was sent to his pharmacy in January 2024.  Have him call his pharmacy for refills.  A 47-month supply was provided for his metoprolol succinate in August 2024.  Have him call his pharmacy for refills.

## 2023-10-19 NOTE — Telephone Encounter (Signed)
LMTCB

## 2023-10-19 NOTE — Telephone Encounter (Signed)
Prescription Request  10/19/2023  LOV: 09/24/2023  What is the name of the medication or equipment? metoprolol succinate (TOPROL-XL) 25 MG 24 hr tablet  atorvastatin (LIPITOR) 40 MG tablet   Have you contacted your pharmacy to request a refill? No   Which pharmacy would you like this sent to?  [id number], [place] - [address] [place] [address] Phone: [phone] Fax: [phone]    Patient notified that their request is being sent to the clinical staff for review and that they should receive a response within 2 business days.   Please advise at Mobile [phone] (mobile)  Pt's wife wanted [place] to be advised that PT will be travelling out of town and that is why they have requested the refills early.   Patient's wife also says that patient is still having some symptoms related to pleurisy. Says that patient is still not feeling well from visit on 10/18, would like to know what Jae Dire would like for them to do?   Wife also states that patient has stopped taking medication montelukast (SINGULAIR) 10 MG tablet , state that it made pt feel itchy. Says that they ave switchd to zyrtec

## 2023-10-20 NOTE — Telephone Encounter (Signed)
Left detailed message on patients voicemail, per dpr advising of Kates message.

## 2023-10-29 ENCOUNTER — Telehealth: Payer: BC Managed Care – PPO | Admitting: Pulmonary Disease

## 2023-10-29 DIAGNOSIS — J4530 Mild persistent asthma, uncomplicated: Secondary | ICD-10-CM | POA: Diagnosis not present

## 2023-10-29 DIAGNOSIS — G4733 Obstructive sleep apnea (adult) (pediatric): Secondary | ICD-10-CM | POA: Diagnosis not present
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
## 2023-11-23 ENCOUNTER — Other Ambulatory Visit: Payer: Self-pay | Admitting: Primary Care

## 2023-11-23 DIAGNOSIS — J4530 Mild persistent asthma, uncomplicated: Secondary | ICD-10-CM

## 2023-11-25 NOTE — Telephone Encounter (Signed)
Left a message on the patient's answering machine to contact our office with any past cardiac history. Told to leave the name of facility and physician phone number if so.

## 2023-12-03 ENCOUNTER — Ambulatory Visit: Payer: BC Managed Care – PPO | Admitting: Cardiovascular Disease

## 2023-12-03 NOTE — Progress Notes (Deleted)
Cardiology Office Note   Date:  12/03/2023   ID:  Harold Robinson, DOB 1971-09-14, MRN [id number]  PCP:  Doreene Nest, NP  Cardiologist:  Lorine Bears, MD   No chief complaint on file.     History of Present Illness: Harold Robinson is a 52 y.o. male who presents for ***    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"   Esophagitis    Hiatal hernia    Hyperlipidemia    Hypertension    Neck pain    Pleurisy     Past Surgical History:  Procedure Laterality Date   CHOLECYSTECTOMY N/A 06/13/2021   Procedure: LAPAROSCOPIC CHOLECYSTECTOMY;  Surgeon: Berna Bue, MD;  Location: [facility];  Service: General;  Laterality: N/A;   LEFT HEART CATH AND CORONARY ANGIOGRAPHY N/A 06/12/2021   Procedure: LEFT HEART CATH AND CORONARY ANGIOGRAPHY;  Surgeon: Yvonne Kendall, MD;  [facility]: [facility];  Service: Cardiovascular;  Laterality: N/A;   ORIF TIBIA PLATEAU Left 12/11/2016   Procedure: OPEN REDUCTION INTERNAL FIXATION (ORIF) LEFT TIBIAL PLATEAU FRACTURE;  Surgeon: Kathryne Hitch, MD;  Location: [facility];  Service: Orthopedics;  Laterality: Left;   WISDOM TOOTH EXTRACTION       Current Outpatient Medications  Medication Sig Dispense Refill   albuterol (VENTOLIN HFA) 108 (90 Base) MCG/ACT inhaler INHALE 2 PUFFS INTO THE LUNGS EVERY 4 HOURS AS NEEDED FOR WHEEZE 8.5 each 0   aspirin EC 81 MG tablet Take 81 mg by mouth every other day. Swallow whole.     atorvastatin (LIPITOR) 40 MG tablet TAKE 1 TABLET BY MOUTH EVERYDAY AT BEDTIME 90 tablet 3   budesonide-formoterol (SYMBICORT) 160-4.5 MCG/ACT inhaler Inhale 2 puffs into the lungs in the morning and at bedtime. 10.2 g 5   losartan (COZAAR) 100 MG tablet TAKE 1 TABLET BY MOUTH EVERY DAY 90 tablet 3   metoprolol succinate (TOPROL-XL) 25 MG 24 hr tablet  Take 3 tablets (75 mg total) by mouth daily. for blood pressure. 270 tablet 1   montelukast (SINGULAIR) 10 MG tablet Take 1 tablet (10 mg total) by mouth at bedtime. For asthma/allergies 90 tablet 0   Multiple Vitamin (MULTIVITAMIN) capsule Take 1 capsule by mouth daily.     sertraline (ZOLOFT) 100 MG tablet Take 1 tablet (100 mg total) by mouth daily. for anxiety and depression. 90 tablet 2   sertraline (ZOLOFT) 50 MG tablet TAKE 1 TABLET (50 MG TOTAL) BY MOUTH DAILY. FOR ANXIETY AND DEPRESSION. TAKE WITH 100 MG DOSE. 90 tablet 1   No current facility-administered medications for this visit.    Allergies:   Patient has no known allergies.    Social History:  The patient  reports that he has never smoked. He has never been exposed to tobacco smoke. He has never used smokeless tobacco. He reports that he does not drink alcohol and does not use drugs.   Family History:  The patient's ***family history includes Allergies in his mother; Asthma in his mother; Breast cancer in his sister; Heart disease in his father, paternal aunt, and paternal uncle; Hypertension in his mother; Stroke in his father.    ROS:  Please see the history of present illness.   Otherwise, review of systems are positive for {NONE DEFAULTED:18576}.  All other systems are reviewed and negative.    PHYSICAL EXAM: VS:  There were no vitals taken for this visit. , BMI There is no height or weight on file to calculate BMI. GEN: Well nourished, well developed, in no acute distress  HEENT: normal  Neck: no JVD, carotid bruits, or masses Cardiac: ***RRR; no murmurs, rubs, or gallops,no edema  Respiratory:  clear to auscultation bilaterally, normal work of breathing GI: soft, nontender, nondistended, + BS MS: no deformity or atrophy  Skin: warm and dry, no rash Neuro:  Strength and sensation are intact Psych: euthymic mood, full affect   EKG:  EKG {ACTION; IS/IS FIE:[id number]} ordered today. The ekg ordered today  demonstrates ***   Recent Labs: 03/24/2023: ALT 36; BUN 20; Creatinine, Ser 1.26; Potassium 4.6; Sodium 139    Lipid Panel    Component Value Date/Time   CHOL 113 03/24/2023 1426   TRIG 169.0 (H) 03/24/2023 1426   HDL 32.40 (L) 03/24/2023 1426   CHOLHDL 3 03/24/2023 1426   VLDL 33.8 03/24/2023 1426   LDLCALC 47 03/24/2023 1426   LDLDIRECT 149.0 02/06/2022 1318      Wt Readings from Last 3 Encounters:  09/24/23 265 lb (120.2 kg)  04/27/23 258 lb (117 kg)  03/24/23 256 lb (116.1 kg)      Other studies Reviewed: Additional studies/ records that were reviewed today include: ***. Review of the above records demonstrates: ***      No data to display            ASSESSMENT AND PLAN:  1.  ***    Disposition:   FU with *** in {gen number 4-16:606301} {Days to years:10300}  Signed,  Lorine Bears, MD  12/03/2023 7:27 AM    [facility]

## 2024-01-20 ENCOUNTER — Ambulatory Visit: Payer: 59 | Attending: Cardiology | Admitting: Cardiology

## 2024-01-20 ENCOUNTER — Encounter: Payer: Self-pay | Admitting: Cardiology

## 2024-01-20 VITALS — BP 136/78 | Ht 71.0 in | Wt 268.4 lb

## 2024-01-20 DIAGNOSIS — R0609 Other forms of dyspnea: Secondary | ICD-10-CM | POA: Diagnosis not present

## 2024-01-20 DIAGNOSIS — Z6837 Body mass index (BMI) 37.0-37.9, adult: Secondary | ICD-10-CM

## 2024-01-20 DIAGNOSIS — I2510 Atherosclerotic heart disease of native coronary artery without angina pectoris: Secondary | ICD-10-CM | POA: Diagnosis not present

## 2024-01-20 DIAGNOSIS — I10 Essential (primary) hypertension: Secondary | ICD-10-CM | POA: Diagnosis not present

## 2024-01-20 DIAGNOSIS — E7800 Pure hypercholesterolemia, unspecified: Secondary | ICD-10-CM

## 2024-01-20 MED ORDER — METOPROLOL SUCCINATE ER 25 MG PO TB24: 50.0000 mg | ORAL_TABLET | Freq: Every day | ORAL | Status: DC

## 2024-01-20 NOTE — Progress Notes (Signed)
Cardiology Office Note:    Date:  01/20/2024   ID:  Harold Robinson, DOB 13-Dec-1970, MRN [id number]  PCP:  Doreene Nest, NP    [facility] Providers Cardiologist:  Debbe Odea, MD     Referring MD: Doreene Nest, NP   Chief Complaint  Patient presents with   New Patient (Initial Visit)    Referred for cardiac evaluation of exertional dyspnea and coronary artery disease involving native coronary artery of native heart without angina pectoris.  Previous cardiac work up in 2022.      History of Present Illness:    Harold Robinson is a 53 y.o. male with a hx of nonobstructive CAD( LHC 2022- 40% ostial LCx, 30% mid LAD), hypertension, hyperlipidemia, OSA on CPAP, asthma, with dyspnea on exertion.  Patient endorsed shortness of breath with exertion and weight gain over the past 1 to 2 years.  Denies smoking.  Compliant with medications as prescribed.  Denies chest pain.  Endorses fatigue, severe sleep apnea, compliant with CPAP mask.  Initially evaluated by our group in 2022 after presenting to the hospital with symptoms of chest pain.  Also endorsed shortness of breath.  Echocardiogram 01/2021 showed EF 60 to 65%, diastolic function normal. LHC 06/8294 mild nonobstructive CAD- 40% ostial LCx, 30% mid LAD  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"   Esophagitis    Hiatal hernia    Hyperlipidemia    Hypertension    Neck pain    Pleurisy     Past Surgical History:  Procedure Laterality Date   CHOLECYSTECTOMY N/A 06/13/2021   Procedure: LAPAROSCOPIC CHOLECYSTECTOMY;  Surgeon: Berna Bue, MD;  Location: [facility];  Service: General;  Laterality: N/A;   LEFT HEART CATH AND CORONARY ANGIOGRAPHY N/A 06/12/2021   Procedure: LEFT HEART CATH AND CORONARY ANGIOGRAPHY;  Surgeon: Yvonne Kendall, MD;   [facility];  Service: Cardiovascular;  Laterality: N/A;   ORIF TIBIA PLATEAU Left 12/11/2016   Procedure: OPEN REDUCTION INTERNAL FIXATION (ORIF) LEFT TIBIAL PLATEAU FRACTURE;  Surgeon: Kathryne Hitch, MD;  Location: [facility];  Service: Orthopedics;  Laterality: Left;   WISDOM TOOTH EXTRACTION      Current Medications: Current Meds  Medication Sig   aspirin EC 81 MG tablet Take 81 mg by mouth every other day. Swallow whole.   atorvastatin (LIPITOR) 40 MG tablet TAKE 1 TABLET BY MOUTH EVERYDAY AT BEDTIME   budesonide-formoterol (SYMBICORT) 160-4.5 MCG/ACT inhaler Inhale 2 puffs into the lungs in the morning and at bedtime.   losartan (COZAAR) 100 MG tablet TAKE 1 TABLET BY MOUTH EVERY DAY   montelukast (SINGULAIR) 10 MG tablet Take 1 tablet (10 mg total) by mouth at bedtime. For asthma/allergies   Multiple Vitamin (MULTIVITAMIN) capsule Take 1 capsule by mouth daily.   sertraline (ZOLOFT) 100 MG tablet Take 1 tablet (100 mg total) by mouth daily. for anxiety and depression.   sertraline (ZOLOFT) 50 MG tablet TAKE 1 TABLET (50 MG TOTAL) BY MOUTH DAILY. FOR ANXIETY AND DEPRESSION. TAKE WITH 100 MG DOSE.   [DISCONTINUED] metoprolol succinate (TOPROL-XL) 25 MG 24 hr tablet Take 3 tablets (75 mg total) by mouth daily. for blood pressure.     Allergies:   Patient has no known allergies.   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  Tobacco Use   Smoking status: Never    Passive exposure: Never   Smokeless tobacco: Never  Vaping Use   Vaping status: Never Used  Substance and Sexual Activity   Alcohol use: No   Drug use: No   Sexual activity: Not on file  Other  Topics Concern   Not on file  Social History Narrative   Not on file   Social Drivers of Health   Financial Resource Strain: Not on file  Food Insecurity: Not on file  Transportation Needs: Not on file  Physical Activity: Not on file  Stress: Not on file  Social Connections: Not on file     Family History: The patient's family history includes Allergies in his mother; Asthma in his mother; Breast cancer in his sister; Heart disease in his father, paternal aunt, and paternal uncle; Hypertension in his mother; Stroke in his father. There is no history of Colon cancer, Esophageal cancer, Rectal cancer, or Stomach cancer.  ROS:   Please see the history of present illness.     All other systems reviewed and are negative.  EKGs/Labs/Other Studies Reviewed:    The following studies were reviewed today:  EKG Interpretation Date/Time:  Thursday January 20 2024 10:33:13 EST Ventricular Rate:  49 PR Interval:  178 QRS Duration:  92 QT Interval:  444 QTC Calculation: 401 R Axis:   56  Text Interpretation: Sinus bradycardia Confirmed by Debbe Odea ([id number]) on 01/20/2024 11:00:19 AM    Recent Labs: 03/24/2023: ALT 36; BUN 20; Creatinine, Ser 1.26; Potassium 4.6; Sodium 139  Recent Lipid Panel    Component Value Date/Time   CHOL 113 03/24/2023 1426   TRIG 169.0 (H) 03/24/2023 1426   HDL 32.40 (L) 03/24/2023 1426   CHOLHDL 3 03/24/2023 1426   VLDL 33.8 03/24/2023 1426   LDLCALC 47 03/24/2023 1426   LDLDIRECT 149.0 02/06/2022 1318     Risk Assessment/Calculations:               Physical Exam:    VS:  BP 136/78 (BP Location: Left Arm, Patient Position: Sitting, Cuff Size: Large)   Ht 5\' 11"  (1.803 m)   Wt 268 lb 6.4 oz (121.7 kg)   SpO2 96%   BMI 37.43 kg/m     Wt Readings from Last 3 Encounters:  01/20/24 268 lb 6.4 oz (121.7 kg)  09/24/23 265 lb (120.2 kg)  04/27/23 258 lb (117 kg)     GEN:  Well nourished, well developed in no acute distress HEENT:  Normal NECK: No JVD; No carotid bruits CARDIAC: RRR, no murmurs, rubs, gallops RESPIRATORY:  Clear to auscultation without rales, wheezing or rhonchi  ABDOMEN: Soft, non-tender, non-distended MUSCULOSKELETAL:  No edema; No deformity  SKIN: Warm and dry NEUROLOGIC:  Alert and oriented x 3 PSYCHIATRIC:  Normal affect   ASSESSMENT:    1. Exertional dyspnea   2. Coronary artery disease involving native coronary artery of native heart, unspecified whether angina present   3. Primary hypertension   4. Pure hypercholesterolemia   5. BMI 37.0-37.9, adult    PLAN:  In order of problems listed above:  Dyspnea on exertion, history of CAD.  Obtain limited echo.  Obtain coronary CTA. nonobstructive CAD on [facility] 2022.  Patient with dyspnea.  Coronary CT as above.  Continue aspirin, Lipitor 40 mg daily. Hypertension, bradycardic heart rate 49.  Reduce Toprol-XL to 50 mg daily.  Continue losartan 100 mg daily. Hyperlipidemia, LDL at goal, continue Lipitor 40 mg daily. Obesity, deconditioning could be contributing.  Low-calorie diet, weight loss, increased activity advised.  Follow-up after cardiac testing      Medication Adjustments/Labs and Tests Ordered: Current medicines are reviewed at length with the patient today.  Concerns regarding medicines are outlined above.  Orders Placed This Encounter  Procedures   CT CORONARY MORPH W/CTA COR W/SCORE W/CA W/CM &/OR WO/CM   Basic Metabolic Panel (BMET)   EKG 12-Lead   ECHOCARDIOGRAM LIMITED   Meds ordered this encounter  Medications   metoprolol succinate (TOPROL-XL) 25 MG 24 hr tablet    Sig: Take 2 tablets (50 mg total) by mouth daily. for blood pressure.    Patient Instructions  Medication Instructions:   Metoprolol Succinate - Take two tablets ( 50mg ) by mouth daily.    *If you need a refill on your cardiac medications before your next appointment, please call your pharmacy*   Lab Work:  Your physician recommends you have  labs -BMP   If you have labs (blood work) drawn today and your tests are completely normal, you will receive your results only by: MyChart Message (if you have MyChart) OR A paper copy in the mail If you have any lab test that is abnormal or we need to change your treatment, we will call you to review the results.   Testing/Procedures:  Your physician has requested that you have an LIMITED echocardiogram. Echocardiography is a painless test that uses sound waves to create images of your heart. It provides your doctor with information about the size and shape of your heart and how well your heart's chambers and valves are working. This procedure takes approximately one hour. There are no restrictions for this procedure. Please do NOT wear cologne, perfume, aftershave, or lotions (deodorant is allowed). Please arrive 15 minutes prior to your appointment time.  Please note: We ask at that you not bring children with you during ultrasound (echo/ vascular) testing. Due to room size and safety concerns, children are not allowed in the ultrasound rooms during exams. Our front office staff cannot provide observation of children in our lobby area while testing is being conducted. An adult accompanying a patient to their appointment will only be allowed in the ultrasound room at the discretion of the ultrasound technician under special circumstances. We apologize for any inconvenience.    Your cardiac CT will be scheduled at one of the below locations:   [facility] [address] [place], [place] [address] [phone]  OR   [facility] [address] [place], [place] [address] [phone]  If scheduled at [facility] or [facility], please arrive 15 mins early for check-in and test prep.  There is spacious parking and easy access to the radiology department from the [facility] V[facility] entrance. Please enter here and check-in with the desk attendant.    Please follow these instructions carefully (unless otherwise directed):  An IV will be required for this test and Nitroglycerin will be given.  Hold all erectile dysfunction medications at least  3 days (72 hrs) prior to test. (Ie viagra, cialis, sildenafil, tadalafil, etc)   On the Night Before the Test: Be sure to Drink plenty of water. Do not consume any caffeinated/decaffeinated beverages or chocolate 12 hours prior to your test. Do not take any antihistamines 12 hours prior to your test.  On the Day of the Test: Drink plenty of water until 1 hour prior to the test. Do not eat any food 1 hour prior to test. You may take your regular medications prior to the test.  If you take Furosemide/Hydrochlorothiazide/Spironolactone/Chlorthalidone, please HOLD on the morning of the test. Patients who wear a continuous glucose monitor MUST remove the device prior to scanning.      After the Test: Drink plenty of water. After receiving IV contrast, you may experience a mild flushed feeling. This is normal. On occasion, you may experience a mild rash up to 24 hours after the test. This is not dangerous. If this occurs, you can take Benadryl 25 mg, Zyrtec, Claritin, or Allegra and increase your fluid intake. (Patients taking Tikosyn should avoid Benadryl, and may take Zyrtec, Claritin, or Allegra) If you experience trouble breathing, this can be serious. If it is severe call 911 IMMEDIATELY. If it is mild, please call our office.  We will call to schedule your test 2-4 weeks out understanding that some insurance companies will need an authorization prior to the service being performed.   For more information and frequently asked questions, please visit our website : [email or web address]  For non-scheduling related questions, please contact the cardiac imaging nurse navigator should you have any  questions/concerns: Cardiac Imaging Nurse Navigators Direct Office Dial: [phone]   For scheduling needs, including cancellations and rescheduling, please call [place], [phone].   Follow-Up: At [facility], you and your health needs are our priority.  As part of our continuing mission to provide you with exceptional heart care, we have created designated Provider Care Teams.  These Care Teams include your primary Cardiologist (physician) and Advanced Practice Providers (APPs -  Physician Assistants and Nurse Practitioners) who all work together to provide you with the care you need, when you need it.  We recommend signing up for the patient portal called "MyChart".  Sign up information is provided on this After Visit Summary.  MyChart is used to connect with patients for Virtual Visits (Telemedicine).  Patients are able to view lab/test results, encounter notes, upcoming appointments, etc.  Non-urgent messages can be sent to your provider as well.   To learn more about what you can do with MyChart, go to [email or web address].    Your next appointment:   8 week(s)  Provider:   You may see Debbe Odea, MD or one of the following Advanced Practice Providers on your designated Care Team:   Nicolasa Ducking, NP Eula Listen, PA-C Cadence Fransico Michael, PA-C Charlsie Quest, NP Carlos Levering, NP   Signed, Debbe Odea, MD  01/20/2024 12:00 PM    [facility]

## 2024-01-20 NOTE — Patient Instructions (Addendum)
Medication Instructions:   Metoprolol Succinate - Take two tablets ( 50mg ) by mouth daily.    *If you need a refill on your cardiac medications before your next appointment, please call your pharmacy*   Lab Work:  Your physician recommends you have labs -BMP   If you have labs (blood work) drawn today and your tests are completely normal, you will receive your results only by: MyChart Message (if you have MyChart) OR A paper copy in the mail If you have any lab test that is abnormal or we need to change your treatment, we will call you to review the results.   Testing/Procedures:  Your physician has requested that you have an LIMITED echocardiogram. Echocardiography is a painless test that uses sound waves to create images of your heart. It provides your doctor with information about the size and shape of your heart and how well your heart's chambers and valves are working. This procedure takes approximately one hour. There are no restrictions for this procedure. Please do NOT wear cologne, perfume, aftershave, or lotions (deodorant is allowed). Please arrive 15 minutes prior to your appointment time.  Please note: We ask at that you not bring children with you during ultrasound (echo/ vascular) testing. Due to room size and safety concerns, children are not allowed in the ultrasound rooms during exams. Our front office staff cannot provide observation of children in our lobby area while testing is being conducted. An adult accompanying a patient to their appointment will only be allowed in the ultrasound room at the discretion of the ultrasound technician under special circumstances. We apologize for any inconvenience.    Your cardiac CT will be scheduled at one of the below locations:   [facility] [address] [place], [place] [address] [phone]  OR   [facility] [address] [place], [place]  [address] [phone]  If scheduled at [facility] or [facility], please arrive 15 mins early for check-in and test prep.  There is spacious parking and easy access to the radiology department from the [facility] entrance. Please enter here and check-in with the desk attendant.    Please follow these instructions carefully (unless otherwise directed):  An IV will be required for this test and Nitroglycerin will be given.  Hold all erectile dysfunction medications at least 3 days (72 hrs) prior to test. (Ie viagra, cialis, sildenafil, tadalafil, etc)   On the Night Before the Test: Be sure to Drink plenty of water. Do not consume any caffeinated/decaffeinated beverages or chocolate 12 hours prior to your test. Do not take any antihistamines 12 hours prior to your test.  On the Day of the Test: Drink plenty of water until 1 hour prior to the test. Do not eat any food 1 hour prior to test. You may take your regular medications prior to the test.  If you take Furosemide/Hydrochlorothiazide/Spironolactone/Chlorthalidone, please HOLD on the morning of the test. Patients who wear a continuous glucose monitor MUST remove the device prior to scanning.      After the Test: Drink plenty of water. After receiving IV contrast, you may experience a mild flushed feeling. This is normal. On occasion, you may experience a mild rash up to 24 hours after the test. This is not dangerous. If this occurs, you can take Benadryl 25 mg, Zyrtec, Claritin, or Allegra and increase your fluid intake. (Patients taking Tikosyn should avoid Benadryl, and  may take Zyrtec, Claritin, or Allegra) If you experience trouble breathing, this can be serious. If it is severe call 911 IMMEDIATELY. If it is mild, please call our office.  We will call to schedule your test 2-4 weeks out understanding that some insurance companies will need an authorization prior to the  service being performed.   For more information and frequently asked questions, please visit our website : [email or web address]  For non-scheduling related questions, please contact the cardiac imaging nurse navigator should you have any questions/concerns: Cardiac Imaging Nurse Navigators Direct Office Dial: [phone]   For scheduling needs, including cancellations and rescheduling, please call [place], [phone].   Follow-Up: At [facility], you and your health needs are our priority.  As part of our continuing mission to provide you with exceptional heart care, we have created designated Provider Care Teams.  These Care Teams include your primary Cardiologist (physician) and Advanced Practice Providers (APPs -  Physician Assistants and Nurse Practitioners) who all work together to provide you with the care you need, when you need it.  We recommend signing up for the patient portal called "MyChart".  Sign up information is provided on this After Visit Summary.  MyChart is used to connect with patients for Virtual Visits (Telemedicine).  Patients are able to view lab/test results, encounter notes, upcoming appointments, etc.  Non-urgent messages can be sent to your provider as well.   To learn more about what you can do with MyChart, go to [email or web address].    Your next appointment:   8 week(s)  Provider:   You may see Debbe Odea, MD or one of the following Advanced Practice Providers on your designated Care Team:   Nicolasa Ducking, NP Eula Listen, PA-C Cadence Fransico Michael, PA-C Charlsie Quest, NP Carlos Levering, NP

## 2024-01-21 LAB — BASIC METABOLIC PANEL
BUN/Creatinine Ratio: 12 (ref 9–20)
BUN: 14 mg/dL (ref 6–24)
CO2: 21 mmol/L (ref 20–29)
Calcium: 9.6 mg/dL (ref 8.7–10.2)
Chloride: 102 mmol/L (ref 96–106)
Creatinine, Ser: 1.13 mg/dL (ref 0.76–1.27)
Glucose: 111 mg/dL — ABNORMAL HIGH (ref 70–99)
Potassium: 4.7 mmol/L (ref 3.5–5.2)
Sodium: 140 mmol/L (ref 134–144)
eGFR: 79 mL/min/{1.73_m2} (ref >59)

## 2024-01-26 ENCOUNTER — Telehealth (HOSPITAL_COMMUNITY): Payer: Self-pay | Admitting: *Deleted

## 2024-01-26 NOTE — Telephone Encounter (Signed)
 Received call from patient regarding upcoming cardiac imaging study; pt verbalizes understanding of appt date/time, parking situation and where to check in, pre-test NPO status and medications ordered, and verified current allergies; name and call back number provided for further questions should they arise Johney Frame RN Navigator Cardiac Imaging [facility] [phone] office [phone] cell

## 2024-01-26 NOTE — Telephone Encounter (Signed)
 Attempted to call patient regarding upcoming cardiac CT appointment. Left message on voicemail with name and callback number Johney Frame RN Navigator Cardiac Imaging [facility] [phone] Office

## 2024-01-27 ENCOUNTER — Encounter: Payer: Self-pay | Admitting: Cardiology

## 2024-01-27 ENCOUNTER — Ambulatory Visit
Admission: RE | Admit: 2024-01-27 | Discharge: 2024-01-27 | Disposition: A | Discharge status: Home / Self Care | Payer: 59 | Source: Ambulatory Visit | Attending: Cardiology | Admitting: Cardiology

## 2024-01-27 DIAGNOSIS — R9431 Abnormal electrocardiogram [ECG] [EKG]: Secondary | ICD-10-CM | POA: Diagnosis not present

## 2024-01-27 DIAGNOSIS — R0609 Other forms of dyspnea: Secondary | ICD-10-CM | POA: Diagnosis not present

## 2024-01-27 MED ORDER — NITROGLYCERIN 0.4 MG SL SUBL
0.8000 mg | SUBLINGUAL_TABLET | Freq: Once | SUBLINGUAL | Status: AC
  Administered 2024-01-27: 0.8 mg via SUBLINGUAL

## 2024-01-27 MED ORDER — METOPROLOL TARTRATE 5 MG/5ML IV SOLN
10.0000 mg | Freq: Once | INTRAVENOUS | Status: AC
Start: 2024-01-27 — End: 2024-01-27
  Administered 2024-01-27: 10 mg via INTRAVENOUS

## 2024-01-27 MED ORDER — IOHEXOL 350 MG/ML SOLN
100.0000 mL | Freq: Once | INTRAVENOUS | Status: AC | PRN
  Administered 2024-01-27: 100 mL via INTRAVENOUS

## 2024-01-27 MED ORDER — METOPROLOL TARTRATE 5 MG/5ML IV SOLN
10.0000 mg | Freq: Once | INTRAVENOUS | Status: AC | PRN
  Administered 2024-01-27: 10 mg via INTRAVENOUS

## 2024-01-27 MED ORDER — SODIUM CHLORIDE 0.9 % IV SOLN: INTRAVENOUS | Status: DC

## 2024-01-27 MED ORDER — DILTIAZEM HCL 25 MG/5ML IV SOLN
10.0000 mg | INTRAVENOUS | Status: DC | PRN
  Administered 2024-01-27: 10 mg via INTRAVENOUS

## 2024-01-27 NOTE — Progress Notes (Signed)
 Patient tolerated procedure well. Ambulate w/o difficulty. Denies light headedness or being dizzy. Sitting up drinking water provided. Encouraged to drink extra water today and reasoning explained. Verbalized understanding. All questions answered. ABC intact. No further needs. Discharge from procedure area w/o issues.

## 2024-01-28 ENCOUNTER — Telehealth: Payer: Self-pay | Admitting: Primary Care

## 2024-01-28 NOTE — Telephone Encounter (Signed)
Copied from CRM [id number]. Topic: Clinical - Lab/Test Results >> Jan 28, 2024  1:35 PM Alcus Dad wrote: Reason for CRM: Patient is requesting Lab Results for CT scan for his heart. Results are in notes. Informed patient of results

## 2024-02-11 ENCOUNTER — Ambulatory Visit: Payer: 59 | Attending: Cardiology

## 2024-02-11 DIAGNOSIS — R0609 Other forms of dyspnea: Secondary | ICD-10-CM | POA: Diagnosis not present

## 2024-02-11 LAB — ECHOCARDIOGRAM LIMITED
Area-P 1/2: 3.42 cm2
Calc EF: 59.1 %
S' Lateral: 3.3 cm
Single Plane A2C EF: 59.3 %
Single Plane A4C EF: 59.1 %

## 2024-02-20 ENCOUNTER — Other Ambulatory Visit: Payer: Self-pay | Admitting: Primary Care

## 2024-02-20 DIAGNOSIS — F39 Unspecified mood [affective] disorder: Secondary | ICD-10-CM

## 2024-02-20 NOTE — Telephone Encounter (Signed)
 Patient is due for CPE/follow up in late April, this will be required prior to any further refills.  Please schedule, thank you!

## 2024-02-21 NOTE — Telephone Encounter (Signed)
 Patient has been scheduled

## 2024-02-25 ENCOUNTER — Other Ambulatory Visit: Payer: Self-pay | Admitting: Internal Medicine

## 2024-02-25 DIAGNOSIS — E782 Mixed hyperlipidemia: Secondary | ICD-10-CM

## 2024-03-07 ENCOUNTER — Ambulatory Visit: Payer: 59 | Attending: Cardiology | Admitting: Cardiology

## 2024-03-07 ENCOUNTER — Encounter: Payer: Self-pay | Admitting: Cardiology

## 2024-03-07 VITALS — BP 128/70 | HR 55 | Ht 70.0 in | Wt 261.8 lb

## 2024-03-07 DIAGNOSIS — I10 Essential (primary) hypertension: Secondary | ICD-10-CM | POA: Diagnosis not present

## 2024-03-07 DIAGNOSIS — I2510 Atherosclerotic heart disease of native coronary artery without angina pectoris: Secondary | ICD-10-CM

## 2024-03-07 DIAGNOSIS — R0609 Other forms of dyspnea: Secondary | ICD-10-CM

## 2024-03-07 DIAGNOSIS — E7800 Pure hypercholesterolemia, unspecified: Secondary | ICD-10-CM

## 2024-03-07 NOTE — Patient Instructions (Signed)
 Medication Instructions:  Your Physician recommend you continue on your current medication as directed.    *If you need a refill on your cardiac medications before your next appointment, please call your pharmacy*  Lab Work: None ordered at this time   Follow-Up: At [facility], you and your health needs are our priority.  As part of our continuing mission to provide you with exceptional heart care, our providers are all part of one team.  This team includes your primary Cardiologist (physician) and Advanced Practice Providers or APPs (Physician Assistants and Nurse Practitioners) who all work together to provide you with the care you need, when you need it.  Your next appointment:   1 year(s)  Provider:   You may see Debbe Odea, MD or one of the following Advanced Practice Providers on your designated Care Team:   Nicolasa Ducking, NP Ames Dura, PA-C Eula Listen, PA-C Cadence Whatley, PA-C Charlsie Quest, NP Carlos Levering, NP

## 2024-03-07 NOTE — Progress Notes (Signed)
 Cardiology Office Note:    Date:  03/07/2024   ID:  Dorian Furnace, DOB 09-07-1971, MRN [id number]  PCP:  Doreene Nest, NP   [facility] Providers Cardiologist:  Debbe Odea, MD     Referring MD: Doreene Nest, NP   No chief complaint on file.   History of Present Illness:    Harold Robinson is a 53 y.o. male with a hx of nonobstructive CAD (LM, LAD <25% stenosis ), hypertension, hyperlipidemia, OSA on CPAP, asthma, presenting for follow-up.  Previously seen due to dyspnea on exertion, echo and coronary CT obtained to evaluate cardiac etiology.  He has been exercising some more, also eating healthy trying to lose weight.  Compliant with meds as prescribed.  Has no new concerns at this time.  Prior notes/testing Echocardiogram 01/2021 showed EF 60 to 65%, diastolic function normal. LHC 03/980 mild nonobstructive CAD- 40% ostial LCx, 30% mid LAD  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"   Esophagitis    Hiatal hernia    Hyperlipidemia    Hypertension    Neck pain    Pleurisy     Past Surgical History:  Procedure Laterality Date   CHOLECYSTECTOMY N/A 06/13/2021   Procedure: LAPAROSCOPIC CHOLECYSTECTOMY;  Surgeon: Berna Bue, MD;  Location: [facility] OR;  Service: General;  Laterality: N/A;   LEFT HEART CATH AND CORONARY ANGIOGRAPHY N/A 06/12/2021   Procedure: LEFT HEART CATH AND CORONARY ANGIOGRAPHY;  Surgeon: Yvonne Kendall, MD;  Location: [facility] INVASIVE CV LAB;  Service: Cardiovascular;  Laterality: N/A;   ORIF TIBIA PLATEAU Left 12/11/2016   Procedure: OPEN REDUCTION INTERNAL FIXATION (ORIF) LEFT TIBIAL PLATEAU FRACTURE;  Surgeon: Kathryne Hitch, MD;  Location: [facility] ORS;  Service: Orthopedics;  Laterality: Left;   WISDOM TOOTH EXTRACTION      Current Medications: Current Meds   Medication Sig   albuterol (VENTOLIN HFA) 108 (90 Base) MCG/ACT inhaler INHALE 2 PUFFS INTO THE LUNGS EVERY 4 HOURS AS NEEDED FOR WHEEZE   aspirin EC 81 MG tablet Take 81 mg by mouth every other day. Swallow whole.   atorvastatin (LIPITOR) 40 MG tablet TAKE 1 TABLET BY MOUTH EVERYDAY AT BEDTIME   budesonide-formoterol (SYMBICORT) 160-4.5 MCG/ACT inhaler Inhale 2 puffs into the lungs in the morning and at bedtime.   losartan (COZAAR) 100 MG tablet TAKE 1 TABLET BY MOUTH EVERY DAY   metoprolol succinate (TOPROL-XL) 25 MG 24 hr tablet Take 2 tablets (50 mg total) by mouth daily. for blood pressure.   montelukast (SINGULAIR) 10 MG tablet Take 1 tablet (10 mg total) by mouth at bedtime. For asthma/allergies   Multiple Vitamin (MULTIVITAMIN) capsule Take 1 capsule by mouth daily.   sertraline (ZOLOFT) 100 MG tablet Take 1 tablet (100 mg total) by mouth daily. for anxiety and depression.   sertraline (ZOLOFT) 50 MG tablet TAKE 1 TABLET (50 MG TOTAL) BY MOUTH DAILY. FOR ANXIETY AND DEPRESSION. TAKE WITH 100 MG DOSE.     Allergies:   Patient has no known allergies.   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  Tobacco Use   Smoking status: Never    Passive exposure: Never   Smokeless tobacco: Never  Vaping Use   Vaping status: Never Used  Substance and Sexual Activity   Alcohol use: No   Drug use: No   Sexual activity: Not on file  Other Topics Concern   Not on file  Social History Narrative   Not on file   Social Drivers of Health   Financial Resource Strain: Not on file  Food Insecurity: Not on file  Transportation Needs: Not on file  Physical Activity: Not on file  Stress: Not  on file  Social Connections: Not on file     Family History: The patient's family history includes Allergies in his mother; Asthma in his mother; Breast cancer in his sister; Heart disease in his father, paternal aunt, and paternal uncle; Hypertension in his mother; Stroke in his father. There is no history of Colon cancer, Esophageal cancer, Rectal cancer, or Stomach cancer.  ROS:   Please see the history of present illness.     All other systems reviewed and are negative.  EKGs/Labs/Other Studies Reviewed:    The following studies were reviewed today:       Recent Labs: 03/24/2023: ALT 36 01/20/2024: BUN 14; Creatinine, Ser 1.13; Potassium 4.7; Sodium 140  Recent Lipid Panel    Component Value Date/Time   CHOL 113 03/24/2023 1426   TRIG 169.0 (H) 03/24/2023 1426   HDL 32.40 (L) 03/24/2023 1426   CHOLHDL 3 03/24/2023 1426   VLDL 33.8 03/24/2023 1426   LDLCALC 47 03/24/2023 1426   LDLDIRECT 149.0 02/06/2022 1318     Risk Assessment/Calculations:             Physical Exam:    VS:  BP 128/70   Pulse (!) 55   Ht 5\' 10"  (1.778 m)   Wt 261 lb 12.8 oz (118.8 kg)   SpO2 93%   BMI 37.56 kg/m     Wt Readings from Last 3 Encounters:  03/07/24 261 lb 12.8 oz (118.8 kg)  01/20/24 268 lb 6.4 oz (121.7 kg)  09/24/23 265 lb (120.2 kg)     GEN:  Well nourished, well developed in no acute distress HEENT: Normal NECK: No JVD; No carotid bruits CARDIAC: RRR, no murmurs, rubs, gallops RESPIRATORY:  Clear to auscultation without rales, wheezing or rhonchi  ABDOMEN: Soft, non-tender, non-distended MUSCULOSKELETAL:  No edema; No deformity  SKIN: Warm and dry NEUROLOGIC:  Alert and oriented x 3 PSYCHIATRIC:  Normal affect   ASSESSMENT:    1. Exertional dyspnea   2. Coronary artery disease involving native coronary artery of native heart, unspecified whether angina present   3. Primary hypertension   4. Pure hypercholesterolemia     PLAN:    In order of problems listed  above:  Dyspnea on exertion, coronary CT 2/25 minimal left main and LAD stenosis(<25%).  Echo 02/2024 EF 60 to 65%, normal diastolic function.  Deconditioning, obesity could be contributing.  Patient encouraged increase activity, low-calorie diet and weight loss. nonobstructive CAD, less than 25% stenosis in LM, LAD continue aspirin, Lipitor 40 mg daily. Hypertension, BP controlled.  Continue losartan 100 mg daily, Toprol-XL 50 mg daily. Hyperlipidemia, LDL at goal, continue Lipitor 40 mg daily.  Follow-up in 6 to 12 months      Medication Adjustments/Labs and  Tests Ordered: Current medicines are reviewed at length with the patient today.  Concerns regarding medicines are outlined above.  No orders of the defined types were placed in this encounter.  No orders of the defined types were placed in this encounter.   There are no Patient Instructions on file for this visit.   Signed, Debbe Odea, MD  03/07/2024 2:04 PM    [facility]

## 2024-03-28 ENCOUNTER — Encounter: Admitting: Primary Care

## 2024-04-06 ENCOUNTER — Encounter: Admitting: Primary Care

## 2024-04-17 ENCOUNTER — Encounter: Admitting: Primary Care

## 2024-04-20 ENCOUNTER — Ambulatory Visit (INDEPENDENT_AMBULATORY_CARE_PROVIDER_SITE_OTHER): Admitting: Primary Care

## 2024-04-20 ENCOUNTER — Encounter: Payer: Self-pay | Admitting: Primary Care

## 2024-04-20 VITALS — BP 138/68 | HR 60 | Temp 96.6°F | Ht 70.0 in | Wt 264.0 lb

## 2024-04-20 DIAGNOSIS — G4733 Obstructive sleep apnea (adult) (pediatric): Secondary | ICD-10-CM

## 2024-04-20 DIAGNOSIS — Z0000 Encounter for general adult medical examination without abnormal findings: Secondary | ICD-10-CM

## 2024-04-20 DIAGNOSIS — R7303 Prediabetes: Secondary | ICD-10-CM

## 2024-04-20 DIAGNOSIS — I10 Essential (primary) hypertension: Secondary | ICD-10-CM

## 2024-04-20 DIAGNOSIS — Z1211 Encounter for screening for malignant neoplasm of colon: Secondary | ICD-10-CM

## 2024-04-20 DIAGNOSIS — F39 Unspecified mood [affective] disorder: Secondary | ICD-10-CM

## 2024-04-20 DIAGNOSIS — E782 Mixed hyperlipidemia: Secondary | ICD-10-CM

## 2024-04-20 DIAGNOSIS — Z125 Encounter for screening for malignant neoplasm of prostate: Secondary | ICD-10-CM | POA: Diagnosis not present

## 2024-04-20 DIAGNOSIS — J4530 Mild persistent asthma, uncomplicated: Secondary | ICD-10-CM

## 2024-04-20 DIAGNOSIS — I2510 Atherosclerotic heart disease of native coronary artery without angina pectoris: Secondary | ICD-10-CM

## 2024-04-20 NOTE — Assessment & Plan Note (Signed)
 Repeat lipid panel pending. Continue atorvastatin 40 mg daily.

## 2024-04-20 NOTE — Assessment & Plan Note (Signed)
Continue CPAP nightly. °

## 2024-04-20 NOTE — Assessment & Plan Note (Signed)
Controlled.  Continue Zoloft 150 mg daily.

## 2024-04-20 NOTE — Progress Notes (Signed)
 Subjective:    Patient ID: Harold Robinson, male    DOB: [birth year], 53 y.o.   MRN: [id number]  HPI  MARIOALBERTO Robinson is a very pleasant 53 y.o. male who presents today for complete physical and follow up of chronic conditions.  Immunizations: -Tetanus: Completed in 2024  -Shingles: Completed Shingrix  series   Diet: Fair diet.  Exercise: No regular exercise.  Eye exam: Completes annually  Dental exam: Completes semi-annually    Colonoscopy: Completed in October 2014, due October 2024, has not completed   PSA: Due   BP Readings from Last 3 Encounters:  04/20/24 138/68  03/07/24 128/70  01/27/24 (!) 164/93        Review of Systems  Constitutional:  Negative for unexpected weight change.  HENT:  Negative for rhinorrhea.   Respiratory:  Negative for cough and shortness of breath.   Cardiovascular:  Negative for chest pain.  Gastrointestinal:  Negative for constipation and diarrhea.  Genitourinary:  Negative for difficulty urinating.  Musculoskeletal:  Negative for arthralgias and myalgias.  Skin:  Negative for rash.  Allergic/Immunologic: Negative for environmental allergies.  Neurological:  Negative for dizziness, numbness and headaches.  Psychiatric/Behavioral:  The patient is not nervous/anxious.          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   Tobacco Use   Smoking status: Never    Passive exposure: Never   Smokeless tobacco: Never  Vaping Use   Vaping status: Never Used  Substance and Sexual Activity   Alcohol use: No   Drug use: No   Sexual activity: Not on file  Other Topics Concern   Not on file  Social History Narrative   Not on file   Social Drivers of Health   Financial Resource Strain: Not on file  Food Insecurity: Not on file  Transportation Needs: Not on file  Physical Activity: Not on file  Stress: Not on file  Social Connections: Not on file  Intimate Partner Violence: Not on file    Past Surgical History:  Procedure Laterality Date   CHOLECYSTECTOMY N/A 06/13/2021   Procedure: LAPAROSCOPIC CHOLECYSTECTOMY;  Surgeon: Adalberto Acton, MD;  Location: [facility] OR;  Service: General;  Laterality: N/A;   LEFT HEART CATH AND CORONARY ANGIOGRAPHY N/A 06/12/2021   Procedure: LEFT HEART CATH AND CORONARY ANGIOGRAPHY;  Surgeon: Sammy Crisp, MD;  [facility] INVASIVE [facility] LAB;  Service: Cardiovascular;  Laterality: N/A;   ORIF TIBIA PLATEAU Left 12/11/2016   Procedure: OPEN REDUCTION INTERNAL FIXATION (ORIF) LEFT TIBIAL PLATEAU FRACTURE;  Surgeon: Arnie Lao, MD;  Location: [facility] ORS;  Service: Orthopedics;  Laterality: Left;   WISDOM TOOTH EXTRACTION      Family History  Problem Relation Age of Onset   Heart disease Father    Stroke Father    Heart disease Paternal Aunt    Heart disease Paternal Uncle    Hypertension Mother    Asthma Mother    Allergies Mother    Breast cancer Sister    Colon cancer Neg Hx    Esophageal cancer Neg Hx    Rectal cancer Neg Hx    Stomach cancer Neg Hx     No Known Allergies  Current Outpatient Medications on File Prior to Visit  Medication Sig Dispense Refill   albuterol  (VENTOLIN  HFA) 108 (90 Base) MCG/ACT inhaler INHALE 2 PUFFS INTO THE LUNGS EVERY 4 HOURS AS NEEDED FOR WHEEZE 8.5 each 0   aspirin  EC 81 MG tablet Take 81 mg by mouth every other day. Swallow  whole.     atorvastatin  (LIPITOR) 40 MG tablet TAKE 1 TABLET BY MOUTH EVERYDAY AT BEDTIME 90 tablet 0   budesonide -formoterol  (SYMBICORT ) 160-4.5 MCG/ACT inhaler Inhale 2 puffs into the lungs in the morning and at bedtime. 10.2 g 5   losartan  (COZAAR ) 100 MG tablet TAKE 1 TABLET BY MOUTH EVERY DAY 90 tablet 3   metoprolol  succinate (TOPROL -XL) 25 MG 24 hr tablet Take 2 tablets (50 mg total) by mouth daily. for blood pressure.     montelukast  (SINGULAIR ) 10 MG tablet Take 1 tablet (10 mg total) by mouth at bedtime. For asthma/allergies 90 tablet 0   Multiple Vitamin (MULTIVITAMIN) capsule Take 1 capsule by mouth daily.     sertraline  (ZOLOFT ) 100 MG tablet Take 1 tablet (100 mg total) by mouth daily. for anxiety and depression. 90 tablet 2   sertraline  (ZOLOFT ) 50 MG tablet TAKE 1 TABLET (50 MG TOTAL) BY MOUTH DAILY. FOR ANXIETY AND DEPRESSION. TAKE WITH 100 MG DOSE. 90 tablet 0   No current facility-administered medications on file prior to visit.    BP 138/68   Pulse 60   Temp (!) 96.6 F (35.9 C) (Temporal)   Ht 5\' 10"  (1.778 m)   Wt 264 lb (119.7 kg)   SpO2 98%   BMI 37.88 kg/m  Objective:   Physical Exam HENT:     Right Ear: Tympanic membrane and ear canal normal.     Left Ear: Tympanic membrane and ear canal normal.  Eyes:     Pupils: Pupils are equal, round, and reactive to light.  Cardiovascular:     Rate and Rhythm: Normal rate and regular rhythm.  Pulmonary:     Effort: Pulmonary effort is normal.     Breath sounds: Normal breath sounds.  Abdominal:     General: Bowel sounds are normal.     Palpations: Abdomen is soft.     Tenderness: There is no abdominal tenderness.  Musculoskeletal:        General: Normal range of motion.     Cervical back: Neck supple.  Skin:    General: Skin is warm and dry.  Neurological:     Mental Status: He is alert and oriented to person, place, and time.     Cranial Nerves: No cranial nerve deficit.     Deep Tendon Reflexes:      Reflex Scores:      Patellar reflexes are 2+ on the right side and 2+ on the left side. Psychiatric:        Mood and Affect: Mood normal.           Assessment & Plan:  Preventative health care Assessment &  Plan: Immunizations UTD. Colonoscopy due, referral placed to GI PSA due and pending.  Discussed the importance of a healthy diet and regular exercise in order for weight loss, and to reduce the risk of further co-morbidity.  Exam stable. Labs pending.  Follow up in 1 year for repeat physical.    Screening for colon cancer -     Ambulatory referral to Gastroenterology  Screening for prostate cancer -     PSA  Mixed hyperlipidemia Assessment & Plan: Repeat lipid panel pending.  Continue atorvastatin  40 mg daily.  Orders: -     Lipid panel -     Comprehensive metabolic panel with GFR -     CBC  Prediabetes -     Hemoglobin A1c -     CBC  Coronary artery disease involving native coronary artery of native heart without angina pectoris Assessment & Plan: Asymptomatic.  Following with cardiology, office notes reviewed from April 2025.  Continue aspirin  81 mg daily, atorvastatin  40 mg daily.   Essential hypertension Assessment & Plan: Controlled.  Continue losartan  100 mg daily, metoprolol  succinate 50 mg daily. CMP pending.   Mild persistent asthma without complication Assessment & Plan: Controlled.  Continue Symbicort  160-4.5 mcg 2 puffs twice daily .  Continue albuterol  inhaler as needed. Continue montelukast  10 mg at bedtime.   OSA (obstructive sleep apnea) Assessment & Plan: Continue CPAP nightly.   Mood disorder (HCC) Assessment & Plan: Controlled.  Continue Zoloft  150 mg daily.         Novi Calia K Betania Dizon, NP

## 2024-04-20 NOTE — Assessment & Plan Note (Signed)
 Controlled.  Continue losartan  100 mg daily, metoprolol  succinate 50 mg daily. CMP pending.

## 2024-04-20 NOTE — Patient Instructions (Signed)
 Stop by the lab prior to leaving today. I will notify you of your results once received.   You will receive a phone call regarding the colonoscopy.  It was a pleasure to see you today!

## 2024-04-20 NOTE — Assessment & Plan Note (Signed)
 Controlled.  Continue Symbicort  160-4.5 mcg 2 puffs twice daily .  Continue albuterol  inhaler as needed. Continue montelukast  10 mg at bedtime.

## 2024-04-20 NOTE — Assessment & Plan Note (Signed)
 Asymptomatic.  Following with cardiology, office notes reviewed from April 2025.  Continue aspirin  81 mg daily, atorvastatin  40 mg daily.

## 2024-04-20 NOTE — Assessment & Plan Note (Signed)
Immunizations UTD. Colonoscopy due, referral placed to GI.  PSA due and pending.   Discussed the importance of a healthy diet and regular exercise in order for weight loss, and to reduce the risk of further co-morbidity.  Exam stable. Labs pending.  Follow up in 1 year for repeat physical.

## 2024-04-21 LAB — COMPREHENSIVE METABOLIC PANEL WITH GFR
ALT: 36 U/L (ref 0–53)
AST: 18 U/L (ref 0–37)
Albumin: 4.3 g/dL (ref 3.5–5.2)
Alkaline Phosphatase: 66 U/L (ref 39–117)
BUN: 12 mg/dL (ref 6–23)
CO2: 31 meq/L (ref 19–32)
Calcium: 9.3 mg/dL (ref 8.4–10.5)
Chloride: 99 meq/L (ref 96–112)
Creatinine, Ser: 1.22 mg/dL (ref 0.40–1.50)
GFR: 68.54 mL/min (ref >60.00)
Glucose, Bld: 109 mg/dL — ABNORMAL HIGH (ref 70–99)
Potassium: 4.3 meq/L (ref 3.5–5.1)
Sodium: 138 meq/L (ref 135–145)
Total Bilirubin: 1.1 mg/dL (ref 0.2–1.2)
Total Protein: 7.1 g/dL (ref 6.0–8.3)

## 2024-04-21 LAB — CBC
HCT: 43.7 % (ref 39.0–52.0)
Hemoglobin: 14.7 g/dL (ref 13.0–17.0)
MCHC: 33.5 g/dL (ref 30.0–36.0)
MCV: 86.3 fl (ref 78.0–100.0)
Platelets: 200 10*3/uL (ref 150.0–400.0)
RBC: 5.06 Mil/uL (ref 4.22–5.81)
RDW: 14.5 % (ref 11.5–15.5)
WBC: 8.8 10*3/uL (ref 4.0–10.5)

## 2024-04-21 LAB — LIPID PANEL
Cholesterol: 200 mg/dL (ref 0–200)
HDL: 32.6 mg/dL — ABNORMAL LOW (ref >39.00)
LDL Cholesterol: 107 mg/dL — ABNORMAL HIGH (ref 0–99)
NonHDL: 167.62
Total CHOL/HDL Ratio: 6
Triglycerides: 304 mg/dL — ABNORMAL HIGH (ref 0.0–149.0)
VLDL: 60.8 mg/dL — ABNORMAL HIGH (ref 0.0–40.0)

## 2024-04-21 LAB — HEMOGLOBIN A1C: Hgb A1c MFr Bld: 6.4 % (ref 4.6–6.5)

## 2024-04-25 ENCOUNTER — Other Ambulatory Visit: Payer: Self-pay | Admitting: Primary Care

## 2024-04-25 ENCOUNTER — Ambulatory Visit: Payer: Self-pay | Admitting: Primary Care

## 2024-04-25 DIAGNOSIS — R7303 Prediabetes: Secondary | ICD-10-CM

## 2024-04-25 LAB — PSA: PSA: 0.24 ng/mL (ref 0.10–4.00)

## 2024-05-09 ENCOUNTER — Ambulatory Visit: Admitting: Primary Care

## 2024-05-09 VITALS — BP 136/64 | HR 55 | Temp 97.5°F | Ht 70.0 in | Wt 259.0 lb

## 2024-05-09 DIAGNOSIS — J3089 Other allergic rhinitis: Secondary | ICD-10-CM | POA: Diagnosis not present

## 2024-05-09 DIAGNOSIS — R0609 Other forms of dyspnea: Secondary | ICD-10-CM | POA: Diagnosis not present

## 2024-05-09 DIAGNOSIS — J4530 Mild persistent asthma, uncomplicated: Secondary | ICD-10-CM

## 2024-05-09 MED ORDER — TRELEGY ELLIPTA 100-62.5-25 MCG/ACT IN AEPB: 1.0000 | INHALATION_SPRAY | Freq: Every day | RESPIRATORY_TRACT | 5 refills | Status: AC

## 2024-05-09 MED ORDER — LEVOCETIRIZINE DIHYDROCHLORIDE 5 MG PO TABS: 5.0000 mg | ORAL_TABLET | Freq: Every evening | ORAL | 0 refills | Status: DC

## 2024-05-09 NOTE — Assessment & Plan Note (Signed)
 Could be uncontrolled based on exertional dyspnea.   Stop Symbicort  160-4.5 mcg, 2 puffs BID as it does not seem effective. Start Trelegy Ellipta 100-62.5-25 mcg, 1 puff daily. Continue albuterol  inhaler as needed.  Continue singular 10 mg at bedtime for now.  Question if this is even effective. Start levocetirizine 5 mg at bedtime.  He will update.  Reviewed pulmonology notes from November 2024.

## 2024-05-09 NOTE — Patient Instructions (Signed)
 Stop using Symbicort  inhaler for now.  This is a daily inhaler that you twice daily  Start Trelegy inhaler for asthma.  Inhale 1 puff into the lungs once daily.  Start levocetirizine (Xyzal) 5 mg every night at bedtime for allergies and drainage.  Stop by the lab prior to leaving today. I will notify you of your results once received.   Please keep me updated regarding your breathing and drainage.  It was a pleasure to see you today!

## 2024-05-09 NOTE — Progress Notes (Signed)
 Subjective:    Patient ID: Harold Robinson, male    DOB: 1971/03/06, 53 y.o.   MRN: [id number]  HPI  Harold Robinson is a very pleasant 53 y.o. male with a history of hypertension, mild persistent asthma, exertional dyspnea who presents today to discuss several concerns.  1) Hyperlipidemia: Currently prescribed atorvastatin  40 mg daily which was initiated in 2019 for hyperlipidemia. Today he discusses that he has not taken the atorvastatin  in over the last few weeks as he developed itching when he started taking it.  The itching resolved once he discontinued.  He underwent CT coronary morphology in March 2025 which showed minimal nonobstructive CAD.   He has been working on weight loss by cutting out sodas, breads.   Wt Readings from Last 3 Encounters:  05/09/24 259 lb (117.5 kg)  04/20/24 264 lb (119.7 kg)  03/07/24 261 lb 12.8 oz (118.8 kg)     The 10-year ASCVD risk score (Arnett DK, et al., 2019) is: 7.6%   Values used to calculate the score:     Age: 18 years     Sex: Male     Is Non-Hispanic African American: No     Diabetic: No     Tobacco smoker: No     Systolic Blood Pressure: 136 mmHg     Is BP treated: Yes     HDL Cholesterol: 32.6 mg/dL     Total Cholesterol: 200 mg/dL  2) Post Nasal Drip: Chronic since 2023. Symptoms include nasal congestion, rhinorrhea, post nasal drip, wheezing, and shortness of breath. He experiences constant drainage every night. He experiences dyspnea with walking more than 5-10 minutes, walking a long parking lot.   Evaluated by pulmonology and cardiology. Cardiac work up negative and pulmonology work up without interstitial lung disease. He did have mild air trapping on PFTs. He does not use his CPAP during the entire night due to his post nasal drip.  Currently prescribed montelukast  10 mg at bedtime, Symbicort  160-4.5 mcg inhaler BID, albuterol  inhaler as needed. He doesn't notice a big improvement on Symbicort .   He does experience heat  rashes, but otherwise no rashes. He does work outdoors frequently. Has never seen an allergist.   Review of Systems  HENT:  Positive for postnasal drip.   Respiratory:  Positive for shortness of breath and wheezing.   Cardiovascular:  Negative for chest pain.  Allergic/Immunologic: Positive for environmental allergies.         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  Tobacco Use   Smoking status: Never    Passive exposure: Never   Smokeless tobacco: Never  Vaping Use   Vaping status: Never Used  Substance and Sexual Activity  Alcohol use: No   Drug use: No   Sexual activity: Not on file  Other Topics Concern   Not on file  Social History Narrative   Not on file   Social Drivers of Health   Financial Resource Strain: Not on file  Food Insecurity: Not on file  Transportation Needs: Not on file  Physical Activity: Not on file  Stress: Not on file  Social Connections: Not on file  Intimate Partner Violence: Not on file    Past Surgical History:  Procedure Laterality Date   CHOLECYSTECTOMY N/A 06/13/2021   Procedure: LAPAROSCOPIC CHOLECYSTECTOMY;  Surgeon: Adalberto Acton, MD;  Location: [facility] OR;  Service: General;  Laterality: N/A;   LEFT HEART CATH AND CORONARY ANGIOGRAPHY N/A 06/12/2021   Procedure: LEFT HEART CATH AND CORONARY ANGIOGRAPHY;  Surgeon: Sammy Crisp, MD;  Location: [facility] INVASIVE CV LAB;  Service: Cardiovascular;  Laterality: N/A;   ORIF TIBIA PLATEAU Left 12/11/2016   Procedure: OPEN REDUCTION INTERNAL FIXATION (ORIF) LEFT TIBIAL PLATEAU FRACTURE;  Surgeon: Arnie Lao, MD;  Location: [facility] ORS;  Service: Orthopedics;  Laterality: Left;   WISDOM TOOTH EXTRACTION      Family History  Problem Relation Age of Onset   Heart disease Father    Stroke Father    Heart disease Paternal Aunt    Heart disease Paternal Uncle    Hypertension Mother    Asthma Mother    Allergies Mother    Breast cancer Sister    Colon cancer Neg Hx    Esophageal cancer Neg Hx    Rectal cancer Neg Hx    Stomach cancer Neg Hx     No Known Allergies  Current Outpatient Medications on File Prior to Visit  Medication Sig Dispense Refill   albuterol  (VENTOLIN  HFA) 108 (90 Base) MCG/ACT inhaler INHALE 2 PUFFS INTO THE LUNGS EVERY 4 HOURS AS NEEDED FOR WHEEZE 8.5 each 0   aspirin  EC 81 MG tablet Take 81 mg by mouth every other day. Swallow whole.     losartan  (COZAAR ) 100 MG tablet TAKE 1 TABLET BY MOUTH EVERY DAY 90 tablet 3   metoprolol  succinate (TOPROL -XL) 25 MG 24 hr tablet Take 2 tablets (50 mg total) by mouth daily. for blood pressure.     montelukast  (SINGULAIR ) 10 MG tablet Take 1 tablet (10 mg total) by mouth at bedtime. For asthma/allergies 90 tablet 0   Multiple Vitamin (MULTIVITAMIN) capsule Take 1 capsule by mouth daily.     sertraline  (ZOLOFT ) 100 MG tablet Take 1 tablet (100 mg total) by mouth daily. for anxiety and depression. 90 tablet 2   sertraline  (ZOLOFT ) 50 MG tablet TAKE 1 TABLET (50 MG TOTAL) BY MOUTH DAILY. FOR ANXIETY AND DEPRESSION. TAKE WITH 100 MG DOSE. 90 tablet 0   No current facility-administered medications on file prior to visit.    BP 136/64   Pulse (!) 55   Temp (!) 97.5 F (36.4 C) (Temporal)   Ht 5\' 10"  (1.778 m)   Wt 259 lb (117.5 kg)   SpO2 94%   BMI 37.16 kg/m  Objective:   Physical  Exam Cardiovascular:     Rate and Rhythm: Normal rate and regular rhythm.  Pulmonary:     Effort: Pulmonary effort is normal.     Breath sounds: Normal breath sounds.  Musculoskeletal:     Cervical back: Neck supple.  Skin:    General: Skin is warm and dry.  Neurological:     Mental Status: He is  alert and oriented to person, place, and time.  Psychiatric:        Mood and Affect: Mood normal.           Assessment & Plan:  Exertional dyspnea Assessment & Plan: Determined not to be cardiac.  Reviewed cardiology notes from April 2025.  Could be secondary to deconditioning/obesity. Stop Symbicort  inhaler. Start Trelegy Ellipta 100-62.5-25 mcg, 1 puff daily.  Start Xyzal 5 mg at bedtime for postnasal drip.   Consider allergy referral. He will update.  Orders: -     Alpha-Gal Panel -     Food Allergy Profile -     Trelegy Ellipta; Inhale 1 puff into the lungs daily.  Dispense: 60 each; Refill: 5  Environmental and seasonal allergies -     Alpha-Gal Panel -     Food Allergy Profile -     Levocetirizine Dihydrochloride; Take 1 tablet (5 mg total) by mouth every evening. For allergies  Dispense: 90 tablet; Refill: 0  Mild persistent asthma without complication Assessment & Plan: Could be uncontrolled based on exertional dyspnea.   Stop Symbicort  160-4.5 mcg, 2 puffs BID as it does not seem effective. Start Trelegy Ellipta 100-62.5-25 mcg, 1 puff daily. Continue albuterol  inhaler as needed.  Continue singular 10 mg at bedtime for now.  Question if this is even effective. Start levocetirizine 5 mg at bedtime.  He will update.  Reviewed pulmonology notes from November 2024.  Orders: -     Trelegy Ellipta; Inhale 1 puff into the lungs daily.  Dispense: 60 each; Refill: 5        Gabriel John, NP

## 2024-05-09 NOTE — Assessment & Plan Note (Signed)
 Determined not to be cardiac.  Reviewed cardiology notes from April 2025.  Could be secondary to deconditioning/obesity. Stop Symbicort  inhaler. Start Trelegy Ellipta 100-62.5-25 mcg, 1 puff daily.  Start Xyzal 5 mg at bedtime for postnasal drip.   Consider allergy referral. He will update.

## 2024-05-13 LAB — MISC HAZELNUT COMP PNL
Cor a1(f428): <0.1 kU/L (ref <0.10)
Cor a14(f439): <0.1 kU/L (ref <0.10)
Cor a8(f425): <0.1 kU/L (ref <0.10)
Cor a9(f440): <0.1 kU/L (ref <0.10)

## 2024-05-13 LAB — FOOD ALLERGY PROFILE
Allergen, Salmon, f41: <0.1 kU/L
Almonds: 0.23 kU/L — ABNORMAL HIGH
Brazil Nut: <0.1 kU/L
CLASS: 0
CLASS: 0
CLASS: 0
Cashew IgE: 0.19 kU/L — ABNORMAL HIGH
Class: 0
Class: 0
Egg White IgE: 0.14 kU/L — ABNORMAL HIGH
Fish Cod: <0.1 kU/L
Hazelnut: 0.21 kU/L — ABNORMAL HIGH
Macadamia Nut: 0.29 kU/L — ABNORMAL HIGH
Milk IgE: <0.1 kU/L
Peanut IgE: 0.24 kU/L — ABNORMAL HIGH
Scallop IgE: 0.22 kU/L — ABNORMAL HIGH
Sesame Seed f10: 0.28 kU/L — ABNORMAL HIGH
Shrimp IgE: 0.14 kU/L — ABNORMAL HIGH
Soybean IgE: 0.22 kU/L — ABNORMAL HIGH
Tuna IgE: <0.1 kU/L
Walnut: 0.2 kU/L — ABNORMAL HIGH
Wheat IgE: 0.32 kU/L — ABNORMAL HIGH

## 2024-05-13 LAB — EGG COMPONENT PANEL REFLEX
Allergen, Ovalbumin, f232: 0.12 kU/L — ABNORMAL HIGH
Allergen, Ovomucoid, f233: 0.13 kU/L — ABNORMAL HIGH

## 2024-05-13 LAB — PEANUT COMPONENT PANEL REFLEX
Ara h 1 (f422): <0.1 kU/L (ref <0.10)
Ara h 2 (f423): <0.1 kU/L (ref <0.10)
Ara h 3 (f424): <0.1 kU/L (ref <0.10)
Ara h 8 (f352): <0.1 kU/L (ref <0.10)
Ara h 9 (f427: <0.1 kU/L (ref <0.10)
F447-IgE Ara h 6: <0.1 kU/L (ref <0.10)

## 2024-05-13 LAB — INTERPRETATION:

## 2024-05-13 LAB — ALPHA-GAL PANEL
Allergen, Mutton, f88: <0.1 kU/L
Allergen, Pork, f26: <0.1 kU/L
Beef: <0.1 kU/L
CLASS: 0
CLASS: 0
Class: 0
GALACTOSE-ALPHA-1,3-GALACTOSE IGE*: <0.1 kU/L (ref <0.10)

## 2024-05-13 LAB — MISC CASHEW NUT: ANA O 3 IgE: <0.1 kU/L (ref <0.10)

## 2024-05-15 ENCOUNTER — Ambulatory Visit: Payer: Self-pay | Admitting: Primary Care

## 2024-05-16 ENCOUNTER — Telehealth: Payer: Self-pay | Admitting: Primary Care

## 2024-05-16 NOTE — Telephone Encounter (Signed)
See result note for further documentation.

## 2024-05-16 NOTE — Telephone Encounter (Signed)
 Copied from CRM [id number]. Topic: Clinical - Lab/Test Results >> May 16, 2024 11:15 AM Jenna Moan wrote: Reason for CRM: Patient called about lab result would like a call back from doctor

## 2024-05-17 ENCOUNTER — Other Ambulatory Visit: Payer: Self-pay | Admitting: Primary Care

## 2024-05-17 DIAGNOSIS — Z91018 Allergy to other foods: Secondary | ICD-10-CM

## 2024-05-17 MED ORDER — EPINEPHRINE 0.3 MG/0.3ML IJ SOAJ: 0.3000 mg | INTRAMUSCULAR | 0 refills | Status: AC | PRN

## 2024-05-24 ENCOUNTER — Telehealth: Payer: Self-pay | Admitting: *Deleted

## 2024-05-24 NOTE — Telephone Encounter (Signed)
 Left message on machine to call back

## 2024-05-24 NOTE — Telephone Encounter (Signed)
 Chart review (including/1/25 cardiology office appointment) indicates this patient could be directly booked for a screening colonoscopy in the hospital outpatient endoscopy block.  Presently, my hospital outpatient blocks are booked through August (1 remaining slot but I have on an obvious date needs to be held for any more urgent diagnostic procedures)  So there are 2 options:  This patient can be offered a slot in a hospital endoscopy block when the September schedule is released  Or  Although he was assigned to me because he was on my [facility] schedule before this issue was difficult intubation was discovered by anesthesia, I have never seen him and he could have his colonoscopy with any of our docs who have an available hospital procedure date sooner and are willing to do so.  Please see what those schedules are and make that request of any available provider.  Memory Staggers MD

## 2024-05-24 NOTE — Telephone Encounter (Signed)
Team,   This pt is a documented difficult intubation and his procedure will need to be done at the hospital.   Thanks,  Cathlyn Parsons

## 2024-05-24 NOTE — Telephone Encounter (Signed)
 Dr Dominic Friendly pt last seen in 2014 by Dr Adan Holms- he scheduled direct colon but needs to have colon in hosp due to difficult intubation.  Can he be scheduled or does he need office visit?

## 2024-05-25 NOTE — Telephone Encounter (Signed)
 The pt has been advised that appts have been cancelled.  He is aware we will call him when the Sept schedule opens up. He prefers to stay with Dr Dominic Friendly.

## 2024-05-27 ENCOUNTER — Other Ambulatory Visit: Payer: Self-pay | Admitting: Primary Care

## 2024-05-27 DIAGNOSIS — F39 Unspecified mood [affective] disorder: Secondary | ICD-10-CM

## 2024-05-28 ENCOUNTER — Other Ambulatory Visit: Payer: Self-pay | Admitting: Primary Care

## 2024-05-28 DIAGNOSIS — F419 Anxiety disorder, unspecified: Secondary | ICD-10-CM

## 2024-05-29 ENCOUNTER — Other Ambulatory Visit: Payer: Self-pay | Admitting: Primary Care

## 2024-05-29 ENCOUNTER — Encounter

## 2024-05-29 DIAGNOSIS — Z6837 Body mass index (BMI) 37.0-37.9, adult: Secondary | ICD-10-CM

## 2024-05-30 ENCOUNTER — Other Ambulatory Visit: Payer: Self-pay | Admitting: Primary Care

## 2024-05-30 DIAGNOSIS — E782 Mixed hyperlipidemia: Secondary | ICD-10-CM

## 2024-06-01 ENCOUNTER — Other Ambulatory Visit: Payer: Self-pay

## 2024-06-01 DIAGNOSIS — Z1211 Encounter for screening for malignant neoplasm of colon: Secondary | ICD-10-CM

## 2024-06-01 MED ORDER — NA SULFATE-K SULFATE-MG SULF 17.5-3.13-1.6 GM/177ML PO SOLN: 1.0000 | Freq: Once | ORAL | 0 refills | Status: AC

## 2024-06-01 NOTE — Telephone Encounter (Signed)
 Colon has been set up for 08/10/24 at 115 pm at [facility] with GM

## 2024-06-02 NOTE — Telephone Encounter (Signed)
Colon scheduled, pt instructed and medications reviewed.  Patient instructions mailed to home.  Patient to call with any questions or concerns.

## 2024-06-22 ENCOUNTER — Encounter: Admitting: Gastroenterology

## 2024-08-02 ENCOUNTER — Telehealth: Payer: Self-pay | Admitting: Gastroenterology

## 2024-08-02 ENCOUNTER — Other Ambulatory Visit

## 2024-08-02 NOTE — Telephone Encounter (Addendum)
 Procedure:Colonoscopy Procedure date: 08/10/24 Procedure location: [facility] Arrival Time: 11:45 am Spoke with the patient Y/N:  Yes Any prep concerns?  No  Has the patient obtained the prep from the pharmacy ? Yes Do you have a care partner and transportation: Yes Any additional concerns? No

## 2024-08-03 ENCOUNTER — Other Ambulatory Visit: Payer: Self-pay | Admitting: Gastroenterology

## 2024-08-03 ENCOUNTER — Encounter (HOSPITAL_COMMUNITY): Payer: Self-pay | Admitting: Gastroenterology

## 2024-08-03 MED ORDER — NA SULFATE-K SULFATE-MG SULF 17.5-3.13-1.6 GM/177ML PO SOLN: 1.0000 | Freq: Once | ORAL | 0 refills | Status: AC

## 2024-08-04 ENCOUNTER — Telehealth: Payer: Self-pay | Admitting: Gastroenterology

## 2024-08-04 NOTE — Telephone Encounter (Signed)
 Inbound call from pt wife stating that her husband was not able to get off of work for September the 4 th. Patient is wanting to reschedule for either October or November. Please advise.

## 2024-08-05 ENCOUNTER — Other Ambulatory Visit: Payer: Self-pay | Admitting: Primary Care

## 2024-08-05 DIAGNOSIS — J3089 Other allergic rhinitis: Secondary | ICD-10-CM

## 2024-08-08 NOTE — Telephone Encounter (Signed)
 Appt has been cancelled need to reschedule

## 2024-08-10 ENCOUNTER — Encounter (HOSPITAL_COMMUNITY): Payer: Self-pay | Admitting: Emergency Medicine

## 2024-08-10 ENCOUNTER — Ambulatory Visit (HOSPITAL_COMMUNITY): Admission: RE | Admit: 2024-08-10 | Source: Home / Self Care | Admitting: Gastroenterology

## 2024-08-10 ENCOUNTER — Emergency Department (HOSPITAL_COMMUNITY)

## 2024-08-10 ENCOUNTER — Emergency Department (HOSPITAL_COMMUNITY): Admission: EM | Admit: 2024-08-10 | Discharge: 2024-08-10 | Disposition: A | Discharge status: Home / Self Care

## 2024-08-10 ENCOUNTER — Other Ambulatory Visit: Payer: Self-pay

## 2024-08-10 DIAGNOSIS — Z91010 Allergy to peanuts: Secondary | ICD-10-CM | POA: Insufficient documentation

## 2024-08-10 DIAGNOSIS — N132 Hydronephrosis with renal and ureteral calculous obstruction: Secondary | ICD-10-CM | POA: Diagnosis not present

## 2024-08-10 DIAGNOSIS — Z7982 Long term (current) use of aspirin: Secondary | ICD-10-CM | POA: Insufficient documentation

## 2024-08-10 DIAGNOSIS — Z1211 Encounter for screening for malignant neoplasm of colon: Secondary | ICD-10-CM

## 2024-08-10 DIAGNOSIS — I10 Essential (primary) hypertension: Secondary | ICD-10-CM | POA: Diagnosis not present

## 2024-08-10 DIAGNOSIS — Z79899 Other long term (current) drug therapy: Secondary | ICD-10-CM | POA: Diagnosis not present

## 2024-08-10 DIAGNOSIS — R109 Unspecified abdominal pain: Secondary | ICD-10-CM | POA: Diagnosis present

## 2024-08-10 DIAGNOSIS — N201 Calculus of ureter: Secondary | ICD-10-CM

## 2024-08-10 LAB — CBC WITH DIFFERENTIAL/PLATELET
Abs Immature Granulocytes: 0.02 K/uL (ref 0.00–0.07)
Basophils Absolute: 0 K/uL (ref 0.0–0.1)
Basophils Relative: 0 %
Eosinophils Absolute: 0.2 K/uL (ref 0.0–0.5)
Eosinophils Relative: 2 %
HCT: 47.1 % (ref 39.0–52.0)
Hemoglobin: 15.6 g/dL (ref 13.0–17.0)
Immature Granulocytes: 0 %
Lymphocytes Relative: 12 %
Lymphs Abs: 1.2 K/uL (ref 0.7–4.0)
MCH: 29 pg (ref 26.0–34.0)
MCHC: 33.1 g/dL (ref 30.0–36.0)
MCV: 87.5 fL (ref 80.0–100.0)
Monocytes Absolute: 0.7 K/uL (ref 0.1–1.0)
Monocytes Relative: 7 %
Neutro Abs: 7.8 K/uL — ABNORMAL HIGH (ref 1.7–7.7)
Neutrophils Relative %: 79 %
Platelets: 199 K/uL (ref 150–400)
RBC: 5.38 MIL/uL (ref 4.22–5.81)
RDW: 13.8 % (ref 11.5–15.5)
WBC: 9.9 K/uL (ref 4.0–10.5)
nRBC: 0 % (ref 0.0–0.2)

## 2024-08-10 LAB — COMPREHENSIVE METABOLIC PANEL WITH GFR
ALT: 49 U/L — ABNORMAL HIGH (ref 0–44)
AST: 37 U/L (ref 15–41)
Albumin: 3.9 g/dL (ref 3.5–5.0)
Alkaline Phosphatase: 58 U/L (ref 38–126)
Anion gap: 12 (ref 5–15)
BUN: 15 mg/dL (ref 6–20)
CO2: 23 mmol/L (ref 22–32)
Calcium: 8.9 mg/dL (ref 8.9–10.3)
Chloride: 101 mmol/L (ref 98–111)
Creatinine, Ser: 1.42 mg/dL — ABNORMAL HIGH (ref 0.61–1.24)
GFR, Estimated: 59 mL/min — ABNORMAL LOW (ref >60)
Glucose, Bld: 124 mg/dL — ABNORMAL HIGH (ref 70–99)
Potassium: 4.9 mmol/L (ref 3.5–5.1)
Sodium: 136 mmol/L (ref 135–145)
Total Bilirubin: 1.6 mg/dL — ABNORMAL HIGH (ref 0.0–1.2)
Total Protein: 7.1 g/dL (ref 6.5–8.1)

## 2024-08-10 LAB — URINALYSIS, ROUTINE W REFLEX MICROSCOPIC
Bacteria, UA: NONE SEEN
Bilirubin Urine: NEGATIVE
Glucose, UA: NEGATIVE mg/dL
Ketones, ur: NEGATIVE mg/dL
Leukocytes,Ua: NEGATIVE
Nitrite: NEGATIVE
Protein, ur: NEGATIVE mg/dL
Specific Gravity, Urine: 1.017 (ref 1.005–1.030)
pH: 5 (ref 5.0–8.0)

## 2024-08-10 LAB — LIPASE, BLOOD: Lipase: 23 U/L (ref 11–51)

## 2024-08-10 SURGERY — COLONOSCOPY
Anesthesia: Monitor Anesthesia Care

## 2024-08-10 MED ORDER — OXYCODONE-ACETAMINOPHEN 5-325 MG PO TABS
1.0000 | ORAL_TABLET | Freq: Four times a day (QID) | ORAL | 0 refills | Status: AC | PRN
Start: 2024-08-10 — End: ?

## 2024-08-10 MED ORDER — ONDANSETRON HCL 4 MG/2ML IJ SOLN
4.0000 mg | Freq: Once | INTRAMUSCULAR | Status: AC
  Administered 2024-08-10: 4 mg via INTRAVENOUS
  Filled 2024-08-10: qty 2

## 2024-08-10 MED ORDER — NA SULFATE-K SULFATE-MG SULF 17.5-3.13-1.6 GM/177ML PO SOLN: 1.0000 | Freq: Once | ORAL | 0 refills | Status: AC

## 2024-08-10 MED ORDER — KETOROLAC TROMETHAMINE 15 MG/ML IJ SOLN
15.0000 mg | Freq: Once | INTRAMUSCULAR | Status: AC
  Administered 2024-08-10: 15 mg via INTRAVENOUS
  Filled 2024-08-10: qty 1

## 2024-08-10 MED ORDER — KETOROLAC TROMETHAMINE 10 MG PO TABS: 10.0000 mg | ORAL_TABLET | Freq: Four times a day (QID) | ORAL | 0 refills | Status: AC | PRN

## 2024-08-10 MED ORDER — TAMSULOSIN HCL 0.4 MG PO CAPS
0.4000 mg | ORAL_CAPSULE | Freq: Once | ORAL | Status: AC
  Administered 2024-08-10: 0.4 mg via ORAL
  Filled 2024-08-10: qty 1

## 2024-08-10 MED ORDER — HYDROMORPHONE HCL 1 MG/ML IJ SOLN
1.0000 mg | Freq: Once | INTRAMUSCULAR | Status: AC
  Administered 2024-08-10: 1 mg via INTRAVENOUS
  Filled 2024-08-10: qty 1

## 2024-08-10 MED ORDER — ONDANSETRON 4 MG PO TBDP: 4.0000 mg | ORAL_TABLET | Freq: Three times a day (TID) | ORAL | 0 refills | Status: AC | PRN

## 2024-08-10 MED ORDER — IOHEXOL 350 MG/ML SOLN
75.0000 mL | Freq: Once | INTRAVENOUS | Status: AC | PRN
  Administered 2024-08-10: 75 mL via INTRAVENOUS

## 2024-08-10 MED ORDER — TAMSULOSIN HCL 0.4 MG PO CAPS: 0.4000 mg | ORAL_CAPSULE | Freq: Every day | ORAL | 0 refills | Status: AC

## 2024-08-10 NOTE — ED Notes (Signed)
 PT O2 decreased to 87% after pain medication, placed on 2L [place] increased to 95%

## 2024-08-10 NOTE — Telephone Encounter (Signed)
 Colon has been rescheduled to 09/26/24 at [facility] with GM at 1030 am

## 2024-08-10 NOTE — Discharge Instructions (Signed)
 He looks like you have a kidney stone.  Please take the Toradol  as needed for pain.  If you are still having pain is okay to take the Percocet.  Do not drive or drink alcohol while taking the Percocet as it may make you drowsy.  Take Zofran  as needed for nausea.  Please return to the emergency department or better yet go to [facility] emergency department if you develop fevers or worsening symptoms.

## 2024-08-10 NOTE — Telephone Encounter (Signed)
 Left message on machine to call back

## 2024-08-10 NOTE — ED Provider Notes (Signed)
 [place] EMERGENCY DEPARTMENT AT [facility] Provider Note   CSN: [id number] Arrival date & time: 08/10/24  9244     Patient presents with: Abdominal Pain   Harold Robinson is a 53 y.o. male.   53 year-old-male with PMH of HTN, HLD, and cholecystectomy presenting today with R sided abd pain.  Started yesterday and is associated with non-bloody, non-bilious emesis.  He denies any fevers.  Denies any urinary symptoms.  States that the pain has been constant.  Reports that is the right side.  He reports that the pain is sharp and severe in character.  He denies any fevers, chills, or cough.   Abdominal Pain Associated symptoms: nausea and vomiting        Prior to Admission medications   Medication Sig Start Date End Date Taking? Authorizing Provider  ketorolac  (TORADOL ) 10 MG tablet Take 1 tablet (10 mg total) by mouth every 6 (six) hours as needed. 08/10/24  Yes Ula Prentice SAUNDERS, MD  ondansetron  (ZOFRAN -ODT) 4 MG disintegrating tablet Take 1 tablet (4 mg total) by mouth every 8 (eight) hours as needed for nausea or vomiting. 08/10/24  Yes Ula Prentice SAUNDERS, MD  oxyCODONE -acetaminophen  (PERCOCET/ROXICET) 5-325 MG tablet Take 1 tablet by mouth every 6 (six) hours as needed for severe pain (pain score 7-10). 08/10/24  Yes Ula Prentice SAUNDERS, MD  tamsulosin  (FLOMAX ) 0.4 MG CAPS capsule Take 1 capsule (0.4 mg total) by mouth daily. 08/10/24  Yes Ula Prentice SAUNDERS, MD  albuterol  (VENTOLIN  HFA) 108 [phone] Base) MCG/ACT inhaler INHALE 2 PUFFS INTO THE LUNGS EVERY 4 HOURS AS NEEDED FOR WHEEZE 09/09/23   Gretta Comer POUR, NP  aspirin  EC 81 MG tablet Take 81 mg by mouth every other day. Swallow whole.    [provider]  EPINEPHrine  0.3 mg/0.3 mL IJ SOAJ injection Inject 0.3 mg into the muscle as needed for anaphylaxis. 05/17/24   Clark, Katherine K, NP  Fluticasone -Umeclidin-Vilant (TRELEGY ELLIPTA ) 100-62.5-25 MCG/ACT AEPB Inhale 1 puff into the lungs daily. 05/09/24   Clark, Katherine K, NP   levocetirizine (XYZAL ) 5 MG tablet TAKE 1 TABLET BY MOUTH EVERY DAY IN THE EVENING FOR ALLERGIES 08/05/24   Clark, Katherine K, NP  losartan  (COZAAR ) 100 MG tablet TAKE 1 TABLET BY MOUTH EVERY DAY 01/05/23   Jimmy Charlie FERNS, MD  metoprolol  succinate (TOPROL -XL) 25 MG 24 hr tablet Take 2 tablets (50 mg total) by mouth daily. for blood pressure. 05/29/24   Gretta Comer POUR, NP  montelukast  (SINGULAIR ) 10 MG tablet Take 1 tablet (10 mg total) by mouth at bedtime. For asthma/allergies 09/24/23   Clark, Katherine K, NP  Multiple Vitamin (MULTIVITAMIN) capsule Take 1 capsule by mouth daily.    [provider]  Na Sulfate-K Sulfate-Mg Sulfate concentrate (SUPREP) 17.5-3.13-1.6 GM/177ML SOLN Take 1 kit (354 mLs total) by mouth once for 1 dose. 08/10/24 08/10/24  Mansouraty, Aloha Raddle., MD  sertraline  (ZOLOFT ) 100 MG tablet TAKE 1 TABLET (100 MG TOTAL) BY MOUTH DAILY. FOR ANXIETY AND DEPRESSION. 05/28/24   Clark, Katherine K, NP  sertraline  (ZOLOFT ) 50 MG tablet TAKE 1 TABLET (50 MG TOTAL) BY MOUTH DAILY. FOR ANXIETY AND DEPRESSION. TAKE WITH 100 MG DOSE. 05/28/24   Clark, Katherine K, NP    Allergies: Soybean-containing drug products, Wheat, and Peanut  (diagnostic)    Review of Systems  Gastrointestinal:  Positive for abdominal pain, nausea and vomiting.  All other systems reviewed and are negative.   Updated Vital Signs BP (!) 142/79  Pulse (!) 49   Temp 97.7 F (36.5 C) (Oral)   Resp 20   SpO2 94%   Physical Exam Vitals and nursing note reviewed.   Gen: appears uncomfortable Eyes: PERRL, EOMI HEENT: no oropharyngeal swelling Neck: trachea midline Resp: clear to auscultation bilaterally Card: RRR, no murmurs, rubs, or gallops Abd: Tender over the R periumbilical region with no guarding or rebound Extremities: no calf tenderness, no edema Vascular: 2+ radial pulses bilaterally, 2+ DP pulses bilaterally Skin: no rashes Psyc: acting appropriately   (all labs ordered are listed,  but only abnormal results are displayed) Labs Reviewed  CBC WITH DIFFERENTIAL/PLATELET - Abnormal; Notable for the following components:      Result Value   Neutro Abs 7.8 (*)    All other components within normal limits  COMPREHENSIVE METABOLIC PANEL WITH GFR - Abnormal; Notable for the following components:   Glucose, Bld 124 (*)    Creatinine, Ser 1.42 (*)    ALT 49 (*)    Total Bilirubin 1.6 (*)    GFR, Estimated 59 (*)    All other components within normal limits  URINALYSIS, ROUTINE W REFLEX MICROSCOPIC - Abnormal; Notable for the following components:   Hgb urine dipstick SMALL (*)    All other components within normal limits  LIPASE, BLOOD    EKG: None  Radiology: CT ABDOMEN PELVIS W CONTRAST Result Date: 08/10/2024 CLINICAL DATA:  Acute right lower quadrant abdominal pain. EXAM: CT ABDOMEN AND PELVIS WITH CONTRAST TECHNIQUE: Multidetector CT imaging of the abdomen and pelvis was performed using the standard protocol following bolus administration of intravenous contrast. RADIATION DOSE REDUCTION: This exam was performed according to the departmental dose-optimization program which includes automated exposure control, adjustment of the mA and/or kV according to patient size and/or use of iterative reconstruction technique. CONTRAST:  75mL OMNIPAQUE  IOHEXOL  350 MG/ML SOLN COMPARISON:  None Available. FINDINGS: Lower chest: No acute abnormality. Hepatobiliary: Status post cholecystectomy. Probable hepatic steatosis. No biliary dilatation. Pancreas: Unremarkable. No pancreatic ductal dilatation or surrounding inflammatory changes. Spleen: Normal in size without focal abnormality. Adrenals/Urinary Tract: Adrenal glands appear normal. Cyst is noted in upper pole of left kidney. Minimal right hydroureteronephrosis is noted with perinephric stranding secondary to 4 mm calculus in distal right ureter. Urinary bladder is unremarkable. Stomach/Bowel: Stomach is within normal limits. Appendix  appears normal. No evidence of bowel wall thickening, distention, or inflammatory changes. Vascular/Lymphatic: Aortic atherosclerosis. No enlarged abdominal or pelvic lymph nodes. Reproductive: Prostate is unremarkable. Other: No abdominal wall hernia or abnormality. No abdominopelvic ascites. Musculoskeletal: No acute or significant osseous findings. IMPRESSION: 1. Minimal right hydroureteronephrosis is noted with perinephric stranding secondary to 4 mm calculus in distal right ureter. 2. Probable hepatic steatosis. 3. Aortic atherosclerosis. Aortic Atherosclerosis (ICD10-I70.0). Electronically Signed   By: Lynwood Landy Raddle M.D.   On: 08/10/2024 11:08     Procedures   Medications Ordered in the ED  HYDROmorphone  (DILAUDID ) injection 1 mg (1 mg Intravenous Given 08/10/24 0853)  ondansetron  (ZOFRAN ) injection 4 mg (4 mg Intravenous Given 08/10/24 0853)  iohexol  (OMNIPAQUE ) 350 MG/ML injection 75 mL (75 mLs Intravenous Contrast Given 08/10/24 1048)  HYDROmorphone  (DILAUDID ) injection 1 mg (1 mg Intravenous Given 08/10/24 1137)  ketorolac  (TORADOL ) 15 MG/ML injection 15 mg (15 mg Intravenous Given 08/10/24 1254)  tamsulosin  (FLOMAX ) capsule 0.4 mg (0.4 mg Oral Given 08/10/24 1222)  Medical Decision Making 53 year old male with past medical history of hypertension, hyperlipidemia who is status post cholecystectomy presenting to the emergency department today with abdominal pain.  I will further evaluate the patient here with basic labs including LFTs and lipase to evaluate for hepatobiliary pathology or pancreatitis.  Will obtain a CT scan of his abdomen to evaluate for appendicitis, diverticulitis, colitis, obstruction, or other intra-abdominal pathology.  I will give the patient Dilaudid  and Zofran  for symptoms and reevaluate for ultimate disposition.  The patient's labs are reassuring.  Urinalysis is not consistent with infection.  The patient CT scan does show a 4 mm kidney  stone.  Symptoms did improve with medications here.  He is discharged with return precautions.  Amount and/or Complexity of Data Reviewed Labs: ordered. Radiology: ordered.  Risk Prescription drug management.        Final diagnoses:  Ureterolithiasis    ED Discharge Orders          Ordered    ketorolac  (TORADOL ) 10 MG tablet  Every 6 hours PRN        08/10/24 1303    oxyCODONE -acetaminophen  (PERCOCET/ROXICET) 5-325 MG tablet  Every 6 hours PRN        08/10/24 1303    ondansetron  (ZOFRAN -ODT) 4 MG disintegrating tablet  Every 8 hours PRN        08/10/24 1303    tamsulosin  (FLOMAX ) 0.4 MG CAPS capsule  Daily        08/10/24 1303               Ula Prentice SAUNDERS, MD 08/10/24 1305

## 2024-08-10 NOTE — ED Triage Notes (Signed)
 Pt here from work with c/o right side abd pain started yesterday but got  worse today , along with some n/v , no gall bladder

## 2024-08-11 NOTE — Telephone Encounter (Signed)
Colon scheduled, pt instructed and medications reviewed.  Patient instructions mailed to home.  Patient to call with any questions or concerns.

## 2024-09-19 ENCOUNTER — Encounter (HOSPITAL_COMMUNITY): Payer: Self-pay | Admitting: Gastroenterology

## 2024-09-19 NOTE — Progress Notes (Signed)
 Attempted to obtain medical history for pre op call via telephone, unable to reach at this time. HIPAA compliant voicemail message left requesting return call to pre surgical testing department.

## 2024-09-21 ENCOUNTER — Telehealth: Payer: Self-pay

## 2024-09-21 NOTE — Telephone Encounter (Signed)
 Patty, Hopefully you can reach on Friday or Monday. If he cancels again or no-shows, then no rescheduling with me without being seen in clinic by APP or any MD (this will then be 2 late cancellations). Thanks. GM

## 2024-09-21 NOTE — Telephone Encounter (Signed)
 Procedure:COLON Procedure date: 09/26/24 Procedure location: [facility] Arrival Time: 9:23 Spoke with the patient Y/N: N Any prep concerns? N  Has the patient obtained the prep from the pharmacy ? N Do you have a care partner and transportation: N Any additional concerns? N   I called patient on 3 different occasions. I left a detailed message  and the office number just in case the patient has questions are concerns

## 2024-09-22 NOTE — Telephone Encounter (Signed)
 Unable to reach pt message sent to My Chart to confirm he will keep appt as planned

## 2024-09-25 ENCOUNTER — Telehealth (HOSPITAL_COMMUNITY): Payer: Self-pay

## 2024-09-25 NOTE — Telephone Encounter (Signed)
 Harold Robinson was scheduled for Colonoscopy with Dr. Wilhelmenia on 09/26/24 , at [facility] hospital.   Patient/or family called on 09/25/24 to cancel their procedure due to unable to pass kidney stone, needs to see urologist on 10/21 to address this.  Dr Wilhelmenia & office notified. Patient instructed to call physician's office to reschedule their procedure. Patient demonstrated understanding.

## 2024-09-26 ENCOUNTER — Ambulatory Visit (HOSPITAL_COMMUNITY): Admission: RE | Admit: 2024-09-26 | Source: Home / Self Care | Admitting: Gastroenterology

## 2024-09-26 ENCOUNTER — Encounter (HOSPITAL_COMMUNITY): Admission: RE | Payer: Self-pay | Source: Home / Self Care

## 2024-09-26 SURGERY — COLONOSCOPY
Anesthesia: Monitor Anesthesia Care

## 2024-09-26 NOTE — Telephone Encounter (Signed)
 Sorry to hear this. Hope he gets better. When patient is ready to reschedule, schedule a clinic visit with APP and then he can be scheduled for a procedure visit in the hospital-based setting. Thanks. GM

## 2024-09-26 NOTE — Telephone Encounter (Signed)
 Left message on machine to call back

## 2024-09-27 ENCOUNTER — Encounter: Payer: Self-pay | Admitting: Gastroenterology

## 2024-09-27 NOTE — Telephone Encounter (Signed)
 Left message on machine to call back

## 2024-09-27 NOTE — Telephone Encounter (Signed)
 Continued from previous message- appt made with Camie Furbish PA

## 2024-09-27 NOTE — Telephone Encounter (Signed)
 The pt has returned call and appt made for follow up

## 2024-11-15 ENCOUNTER — Ambulatory Visit: Admitting: Gastroenterology
